# Patient Record
Sex: Male | Born: 1940 | Race: White | Hispanic: No | Smoking: Never smoker
Health system: Southern US, Community
[De-identification: ages and names within clinical notes are randomized; demographics above are authoritative.]

## PROBLEM LIST (undated history)

## (undated) DIAGNOSIS — IMO0002 Reserved for concepts with insufficient information to code with codable children: Secondary | ICD-10-CM

## (undated) DIAGNOSIS — Q211 Atrial septal defect: Secondary | ICD-10-CM

## (undated) DIAGNOSIS — E785 Hyperlipidemia, unspecified: Secondary | ICD-10-CM

## (undated) DIAGNOSIS — N183 Chronic kidney disease, stage 3 unspecified: Secondary | ICD-10-CM

## (undated) DIAGNOSIS — R768 Other specified abnormal immunological findings in serum: Secondary | ICD-10-CM

## (undated) DIAGNOSIS — I350 Nonrheumatic aortic (valve) stenosis: Secondary | ICD-10-CM

## (undated) DIAGNOSIS — S32409A Unspecified fracture of unspecified acetabulum, initial encounter for closed fracture: Secondary | ICD-10-CM

## (undated) DIAGNOSIS — I10 Essential (primary) hypertension: Secondary | ICD-10-CM

## (undated) DIAGNOSIS — I4891 Unspecified atrial fibrillation: Secondary | ICD-10-CM

## (undated) DIAGNOSIS — K5790 Diverticulosis of intestine, part unspecified, without perforation or abscess without bleeding: Secondary | ICD-10-CM

## (undated) DIAGNOSIS — I6340 Cerebral infarction due to embolism of unspecified cerebral artery: Principal | ICD-10-CM

## (undated) HISTORY — DX: Reserved for concepts with insufficient information to code with codable children: IMO0002

## (undated) HISTORY — DX: Diverticulosis of intestine, part unspecified, without perforation or abscess without bleeding: K57.90

## (undated) HISTORY — DX: Unspecified fracture of unspecified acetabulum, initial encounter for closed fracture: S32.409A

## (undated) HISTORY — DX: Essential (primary) hypertension: I10

## (undated) HISTORY — DX: Other specified abnormal immunological findings in serum: R76.8

## (undated) HISTORY — DX: Hyperlipidemia, unspecified: E78.5

---

## 2004-03-20 ENCOUNTER — Inpatient Hospital Stay (HOSPITAL_COMMUNITY): Admission: EM | Admit: 2004-03-20 | Discharge: 2004-03-20 | Payer: Self-pay | Admitting: Emergency Medicine

## 2004-03-20 HISTORY — PX: NEPHRECTOMY: SHX65

## 2004-03-28 ENCOUNTER — Ambulatory Visit (HOSPITAL_COMMUNITY): Admission: RE | Admit: 2004-03-28 | Discharge: 2004-03-28 | Payer: Self-pay | Admitting: Urology

## 2004-03-29 ENCOUNTER — Encounter (INDEPENDENT_AMBULATORY_CARE_PROVIDER_SITE_OTHER): Payer: Self-pay | Admitting: Specialist

## 2004-03-29 ENCOUNTER — Inpatient Hospital Stay (HOSPITAL_COMMUNITY): Admission: RE | Admit: 2004-03-29 | Discharge: 2004-04-01 | Payer: Self-pay | Admitting: Urology

## 2005-02-04 ENCOUNTER — Ambulatory Visit: Payer: Self-pay | Admitting: Internal Medicine

## 2005-03-13 ENCOUNTER — Ambulatory Visit: Payer: Self-pay | Admitting: Internal Medicine

## 2005-03-20 ENCOUNTER — Ambulatory Visit: Payer: Self-pay | Admitting: Internal Medicine

## 2005-03-22 ENCOUNTER — Ambulatory Visit: Payer: Self-pay | Admitting: Internal Medicine

## 2005-04-08 ENCOUNTER — Ambulatory Visit: Payer: Self-pay | Admitting: Internal Medicine

## 2005-04-18 ENCOUNTER — Ambulatory Visit: Payer: Self-pay | Admitting: Internal Medicine

## 2005-06-19 ENCOUNTER — Ambulatory Visit: Payer: Self-pay | Admitting: Internal Medicine

## 2005-07-30 ENCOUNTER — Ambulatory Visit (HOSPITAL_COMMUNITY): Admission: RE | Admit: 2005-07-30 | Discharge: 2005-07-30 | Payer: Self-pay | Admitting: Neurosurgery

## 2005-09-18 ENCOUNTER — Ambulatory Visit: Payer: Self-pay | Admitting: Internal Medicine

## 2006-02-05 ENCOUNTER — Ambulatory Visit: Payer: Self-pay | Admitting: Internal Medicine

## 2006-03-19 ENCOUNTER — Ambulatory Visit: Payer: Self-pay | Admitting: Internal Medicine

## 2006-06-17 ENCOUNTER — Ambulatory Visit: Payer: Self-pay | Admitting: Internal Medicine

## 2007-01-22 ENCOUNTER — Ambulatory Visit: Payer: Self-pay | Admitting: Gastroenterology

## 2007-01-29 ENCOUNTER — Ambulatory Visit: Payer: Self-pay | Admitting: Gastroenterology

## 2007-02-06 ENCOUNTER — Ambulatory Visit: Payer: Self-pay | Admitting: Pulmonary Disease

## 2007-04-27 ENCOUNTER — Telehealth (INDEPENDENT_AMBULATORY_CARE_PROVIDER_SITE_OTHER): Payer: Self-pay | Admitting: *Deleted

## 2007-05-19 DIAGNOSIS — I10 Essential (primary) hypertension: Secondary | ICD-10-CM | POA: Insufficient documentation

## 2007-05-19 DIAGNOSIS — E785 Hyperlipidemia, unspecified: Secondary | ICD-10-CM | POA: Insufficient documentation

## 2007-05-20 ENCOUNTER — Ambulatory Visit: Payer: Self-pay | Admitting: Internal Medicine

## 2007-06-04 ENCOUNTER — Encounter: Payer: Self-pay | Admitting: Pulmonary Disease

## 2007-06-04 ENCOUNTER — Ambulatory Visit (HOSPITAL_BASED_OUTPATIENT_CLINIC_OR_DEPARTMENT_OTHER): Admission: RE | Admit: 2007-06-04 | Discharge: 2007-06-04 | Payer: Self-pay | Admitting: Internal Medicine

## 2007-06-04 ENCOUNTER — Encounter: Payer: Self-pay | Admitting: Internal Medicine

## 2007-06-11 ENCOUNTER — Ambulatory Visit: Payer: Self-pay | Admitting: Pulmonary Disease

## 2007-06-25 ENCOUNTER — Ambulatory Visit: Payer: Self-pay | Admitting: Pulmonary Disease

## 2007-06-25 DIAGNOSIS — G4733 Obstructive sleep apnea (adult) (pediatric): Secondary | ICD-10-CM

## 2008-02-24 ENCOUNTER — Telehealth (INDEPENDENT_AMBULATORY_CARE_PROVIDER_SITE_OTHER): Payer: Self-pay | Admitting: *Deleted

## 2008-03-17 ENCOUNTER — Ambulatory Visit: Payer: Self-pay | Admitting: Internal Medicine

## 2008-03-29 ENCOUNTER — Telehealth (INDEPENDENT_AMBULATORY_CARE_PROVIDER_SITE_OTHER): Payer: Self-pay | Admitting: *Deleted

## 2008-06-10 ENCOUNTER — Ambulatory Visit: Payer: Self-pay | Admitting: Internal Medicine

## 2008-06-10 DIAGNOSIS — R358 Other polyuria: Secondary | ICD-10-CM

## 2008-07-19 ENCOUNTER — Ambulatory Visit: Payer: Self-pay | Admitting: Internal Medicine

## 2008-07-19 DIAGNOSIS — G114 Hereditary spastic paraplegia: Secondary | ICD-10-CM

## 2008-07-19 DIAGNOSIS — R079 Chest pain, unspecified: Secondary | ICD-10-CM | POA: Insufficient documentation

## 2008-07-19 LAB — CONVERTED CEMR LAB
ALT: 20 units/L (ref 0–53)
AST: 16 units/L (ref 0–37)
Albumin: 3.9 g/dL (ref 3.5–5.2)
Alkaline Phosphatase: 62 units/L (ref 39–117)
BUN: 18 mg/dL (ref 6–23)
Basophils Absolute: 0.1 10*3/uL (ref 0.0–0.1)
Basophils Relative: 1 % (ref 0.0–3.0)
Bilirubin Urine: NEGATIVE
Bilirubin, Direct: 0.1 mg/dL (ref 0.0–0.3)
CO2: 30 meq/L (ref 19–32)
CRP, High Sensitivity: 3 (ref 0.00–5.00)
Calcium: 9.6 mg/dL (ref 8.4–10.5)
Chloride: 105 meq/L (ref 96–112)
Cholesterol: 179 mg/dL (ref 0–200)
Creatinine, Ser: 1.3 mg/dL (ref 0.4–1.5)
Eosinophils Absolute: 0.3 10*3/uL (ref 0.0–0.7)
Eosinophils Relative: 3.2 % (ref 0.0–5.0)
GFR calc Af Amer: 71 mL/min
GFR calc non Af Amer: 58 mL/min
Glucose, Bld: 137 mg/dL — ABNORMAL HIGH (ref 70–99)
HCT: 47 % (ref 39.0–52.0)
HDL: 30.8 mg/dL — ABNORMAL LOW (ref >39.0)
Hemoglobin, Urine: NEGATIVE
Hemoglobin: 16.3 g/dL (ref 13.0–17.0)
Ketones, ur: NEGATIVE mg/dL
LDL Cholesterol: 115 mg/dL — ABNORMAL HIGH (ref 0–99)
Leukocytes, UA: NEGATIVE
Lymphocytes Relative: 40 % (ref 12.0–46.0)
MCHC: 34.6 g/dL (ref 30.0–36.0)
MCV: 86.2 fL (ref 78.0–100.0)
Monocytes Absolute: 0.6 10*3/uL (ref 0.1–1.0)
Monocytes Relative: 5.6 % (ref 3.0–12.0)
Neutro Abs: 5.1 10*3/uL (ref 1.4–7.7)
Neutrophils Relative %: 50.2 % (ref 43.0–77.0)
Nitrite: NEGATIVE
Platelets: 286 10*3/uL (ref 150–400)
Potassium: 4 meq/L (ref 3.5–5.1)
RBC: 5.45 M/uL (ref 4.22–5.81)
RDW: 12.6 % (ref 11.5–14.6)
Sodium: 142 meq/L (ref 135–145)
Specific Gravity, Urine: 1.015 (ref 1.000–1.035)
TSH: 2.96 microintl units/mL (ref 0.35–5.50)
Total Bilirubin: 0.8 mg/dL (ref 0.3–1.2)
Total CHOL/HDL Ratio: 5.8
Total Protein, Urine: NEGATIVE mg/dL
Total Protein: 6.9 g/dL (ref 6.0–8.3)
Triglycerides: 165 mg/dL — ABNORMAL HIGH (ref 0–149)
Urine Glucose: NEGATIVE mg/dL
Urobilinogen, UA: 0.2 (ref 0.0–1.0)
VLDL: 33 mg/dL (ref 0–40)
WBC: 10.2 10*3/uL (ref 4.5–10.5)
pH: 6 (ref 5.0–8.0)

## 2008-07-27 ENCOUNTER — Ambulatory Visit: Payer: Self-pay | Admitting: Internal Medicine

## 2008-07-27 DIAGNOSIS — H6120 Impacted cerumen, unspecified ear: Secondary | ICD-10-CM

## 2008-08-09 ENCOUNTER — Encounter: Payer: Self-pay | Admitting: Internal Medicine

## 2008-12-22 ENCOUNTER — Ambulatory Visit: Payer: Self-pay | Admitting: Internal Medicine

## 2008-12-23 LAB — CONVERTED CEMR LAB
ALT: 27 units/L (ref 0–53)
AST: 22 units/L (ref 0–37)
Albumin: 4.1 g/dL (ref 3.5–5.2)
Alkaline Phosphatase: 60 units/L (ref 39–117)
BUN: 16 mg/dL (ref 6–23)
Bilirubin, Direct: 0.2 mg/dL (ref 0.0–0.3)
CO2: 25 meq/L (ref 19–32)
Calcium: 10 mg/dL (ref 8.4–10.5)
Chloride: 106 meq/L (ref 96–112)
Cholesterol: 193 mg/dL (ref 0–200)
Creatinine, Ser: 1.3 mg/dL (ref 0.4–1.5)
GFR calc non Af Amer: 58.24 mL/min (ref >60)
Glucose, Bld: 139 mg/dL — ABNORMAL HIGH (ref 70–99)
HDL: 29.9 mg/dL — ABNORMAL LOW (ref >39.00)
LDL Cholesterol: 132 mg/dL — ABNORMAL HIGH (ref 0–99)
Potassium: 4 meq/L (ref 3.5–5.1)
Sodium: 146 meq/L — ABNORMAL HIGH (ref 135–145)
Total Bilirubin: 1.1 mg/dL (ref 0.3–1.2)
Total CHOL/HDL Ratio: 6
Total Protein: 7.3 g/dL (ref 6.0–8.3)
Triglycerides: 154 mg/dL — ABNORMAL HIGH (ref 0.0–149.0)
VLDL: 30.8 mg/dL (ref 0.0–40.0)

## 2009-04-04 ENCOUNTER — Telehealth (INDEPENDENT_AMBULATORY_CARE_PROVIDER_SITE_OTHER): Payer: Self-pay | Admitting: *Deleted

## 2009-09-17 HISTORY — PX: HIP SURGERY: SHX245

## 2009-10-23 ENCOUNTER — Encounter: Payer: Self-pay | Admitting: Adult Health

## 2009-11-13 ENCOUNTER — Encounter: Payer: Self-pay | Admitting: Internal Medicine

## 2009-11-13 ENCOUNTER — Telehealth (INDEPENDENT_AMBULATORY_CARE_PROVIDER_SITE_OTHER): Payer: Self-pay | Admitting: *Deleted

## 2009-11-14 ENCOUNTER — Telehealth (INDEPENDENT_AMBULATORY_CARE_PROVIDER_SITE_OTHER): Payer: Self-pay | Admitting: *Deleted

## 2009-11-14 ENCOUNTER — Ambulatory Visit: Payer: Self-pay | Admitting: Pulmonary Disease

## 2009-11-14 DIAGNOSIS — S72009A Fracture of unspecified part of neck of unspecified femur, initial encounter for closed fracture: Secondary | ICD-10-CM | POA: Insufficient documentation

## 2009-11-23 ENCOUNTER — Encounter: Payer: Self-pay | Admitting: Internal Medicine

## 2009-11-24 ENCOUNTER — Encounter: Payer: Self-pay | Admitting: Internal Medicine

## 2009-11-27 ENCOUNTER — Encounter: Payer: Self-pay | Admitting: Internal Medicine

## 2009-11-28 ENCOUNTER — Encounter: Payer: Self-pay | Admitting: Internal Medicine

## 2009-11-29 ENCOUNTER — Ambulatory Visit: Payer: Self-pay | Admitting: Internal Medicine

## 2009-12-19 ENCOUNTER — Encounter: Payer: Self-pay | Admitting: Internal Medicine

## 2010-01-04 ENCOUNTER — Encounter: Payer: Self-pay | Admitting: Internal Medicine

## 2010-03-15 ENCOUNTER — Encounter: Payer: Self-pay | Admitting: Internal Medicine

## 2010-05-08 ENCOUNTER — Ambulatory Visit: Payer: Self-pay | Admitting: Internal Medicine

## 2010-05-08 ENCOUNTER — Encounter: Payer: Self-pay | Admitting: Internal Medicine

## 2010-05-08 LAB — CONVERTED CEMR LAB
ALT: 24 units/L (ref 0–53)
AST: 23 units/L (ref 0–37)
Albumin: 3.8 g/dL (ref 3.5–5.2)
Alkaline Phosphatase: 72 units/L (ref 39–117)
BUN: 17 mg/dL (ref 6–23)
Basophils Absolute: 0 10*3/uL (ref 0.0–0.1)
Basophils Relative: 0.4 % (ref 0.0–3.0)
Bilirubin Urine: NEGATIVE
Bilirubin, Direct: 0.1 mg/dL (ref 0.0–0.3)
Blood, UA: NEGATIVE
CO2: 29 meq/L (ref 19–32)
Calcium: 9.7 mg/dL (ref 8.4–10.5)
Chloride: 104 meq/L (ref 96–112)
Cholesterol: 218 mg/dL — ABNORMAL HIGH (ref 0–200)
Creatinine, Ser: 1.1 mg/dL (ref 0.4–1.5)
Direct LDL: 132.1 mg/dL
Eosinophils Absolute: 0.4 10*3/uL (ref 0.0–0.7)
Eosinophils Relative: 3.5 % (ref 0.0–5.0)
GFR calc non Af Amer: 71.85 mL/min (ref >60.00)
Glucose, Bld: 132 mg/dL — ABNORMAL HIGH (ref 70–99)
HCT: 48.5 % (ref 39.0–52.0)
HDL: 37.5 mg/dL — ABNORMAL LOW (ref >39.00)
Hemoglobin: 16.5 g/dL (ref 13.0–17.0)
Ketones, ur: NEGATIVE mg/dL
Leukocytes, UA: NEGATIVE
Lymphocytes Relative: 37.8 % (ref 12.0–46.0)
Lymphs Abs: 4.4 10*3/uL — ABNORMAL HIGH (ref 0.7–4.0)
MCHC: 34 g/dL (ref 30.0–36.0)
MCV: 87.1 fL (ref 78.0–100.0)
Monocytes Absolute: 0.7 10*3/uL (ref 0.1–1.0)
Monocytes Relative: 6.1 % (ref 3.0–12.0)
Neutro Abs: 6.1 10*3/uL (ref 1.4–7.7)
Neutrophils Relative %: 52.2 % (ref 43.0–77.0)
Nitrite: NEGATIVE
Platelets: 304 10*3/uL (ref 150.0–400.0)
Potassium: 4.4 meq/L (ref 3.5–5.1)
RBC: 5.56 M/uL (ref 4.22–5.81)
RDW: 13.7 % (ref 11.5–14.6)
Sodium: 140 meq/L (ref 135–145)
Specific Gravity, Urine: 1.01 (ref 1.000–1.030)
TSH: 3.7 microintl units/mL (ref 0.35–5.50)
Total Bilirubin: 0.7 mg/dL (ref 0.3–1.2)
Total CHOL/HDL Ratio: 6
Total Protein, Urine: NEGATIVE mg/dL
Total Protein: 7 g/dL (ref 6.0–8.3)
Triglycerides: 238 mg/dL — ABNORMAL HIGH (ref 0.0–149.0)
Urine Glucose: NEGATIVE mg/dL
Urobilinogen, UA: 0.2 (ref 0.0–1.0)
VLDL: 47.6 mg/dL — ABNORMAL HIGH (ref 0.0–40.0)
WBC: 11.7 10*3/uL — ABNORMAL HIGH (ref 4.5–10.5)
pH: 6.5 (ref 5.0–8.0)

## 2010-05-09 LAB — CONVERTED CEMR LAB: Vit D, 25-Hydroxy: 37 ng/mL (ref 30–89)

## 2010-06-09 ENCOUNTER — Encounter: Payer: Self-pay | Admitting: Neurosurgery

## 2010-06-19 NOTE — Letter (Signed)
Summary: [facility]  [facility]   Imported By: Sherian Rein 11/29/2009 07:50:29  _____________________________________________________________________  External Attachment:    Type:   Image     Comment:   External Document

## 2010-06-19 NOTE — Assessment & Plan Note (Signed)
Summary: Primary svc/ f/u hbp/ ear wax   Copy to:  kaplan/Dann Galicia Primary Provider/Referring Provider:  Sherene Sires  CC:  2 wk followup.  Pt states no complaints today.Marland Kitchen  History of Present Illness: 70  yowm  never smoker with hypertension and hyperlipidemia and spastic paraparesis.   June 10, 2008 missed cpx,  c/o nocturia x 3 x years,  persistent nightly pattern, was drinking lots of sodas so wife rec he switch to tea, no better. denies dysuria or decreased fos.  resolved after d/c excess fluids  July 19, 2008 ov after fell 2 weeks prior to ov hit left chest just below lateral aspect of left breast ( 4-5 oclock to nipple) with small bruise was painful to cough and deep breath better by ov on advil, not sob, no cough.     July 27, 2008--returns for 1 week follow up and lab review. CXR last ov w/ chronic changes. Labs showed crp 3.0 , LDL at goal of 115 on lipitor 10mg . Has bilateral wax impactions, to have ear wash today.   December 22, 2008 ov to check bp , no sob tia or claudication.     November 14, 2009--Returns for follow up and med review.  Fell off trailer 5/26 w/ left  acetabular fx admitted [facility] w/ medical managment, discharged to rehab for 3 weeks. Discharged 2 days ago (6/26). Also right lower leg injury w/ old poor healed fx that required cast for support.  Discharged on  lovenox 30mg  two times a day to until 11/24/09.Marland Kitchen Has follow up with ortho 12/25/09. We have no discharge summary or hospital/rehab notes. I reveiewed the ER note day of admit. We are trying to get records. His HTN meds are unchanged. He stopped Lipitor >9 months ago.  Home health w/ labs done yesterday -Intermim per pt. . PT in home is suppose to begin soon. He is on no weight bearing for left leg. Wife and family are with him today. November 29, 2009 2 wk followup.  Pt states no complaints today. in Wheelchair with questions re rehab issues.  Pt denies any significant sore throat, dysphagia, itching, sneezing,  nasal congestion or  excess secretions,  fever, chills, sweats, unintended wt loss, pleuritic or exertional cp, hempoptysis,   orthopnea pnd or leg swelling    Current Medications (verified): 1)  Furosemide 20 Mg  Tabs (Furosemide) .... Every Morning 2)  Adult Aspirin Low Strength 81 Mg  Tbdp (Aspirin) .Marland Kitchen.. 1 By Mouth Daily 3)  Lotrel 10-20 Mg  Caps (Amlodipine Besy-Benazepril Hcl) .Marland Kitchen.. 1 By Mouth Daily 4)  Multivitamins  Tabs (Multiple Vitamin) .Marland Kitchen.. 1 By Mouth Once Daily 5)  Senokot 8.6 Mg Tabs (Sennosides) .... As Directed As Needed  Allergies (verified): No Known Drug Allergies  Past History:  Past Medical History: Hyperlipidemia     - Target LDL < 130 (hbp, male gender) Hypertension Spastic Paraparesis.............................................................................Mar[facility] Health Maintenance..............................................................................Marland KitchenWert      - DT  1/07      - Pneumovax 1/07      - CPX July 19, 2008  Hep C antibody positive Diverticulosis     - Colonoscopy 01/29/2007 Left acetabular fx 10/12/09 w/ hospitalization/rehab  x4 weeks at [facility].............Marland KitchenNoralyn Pick      - no surgery required  Vital Signs:  Patient profile:   70 year old male O2 Sat:      96 % on Room air Temp:     98.2 degrees F oral Pulse rate:   72 / minute  BP sitting:   122 / 76  (left arm)  Vitals Entered By: Vernie Murders (November 29, 2009 11:12 AM)  O2 Flow:  Room air  Physical Exam  Additional Exam:  elderly wm  healthy appearing in no acute distress- in wheelchair  Afeb with normal vital signs wt  206 March 17, 2008  > 209 June 10, 2008 > 207 July 19, 2008 >>212 July 27, 2008  HEENT: nl dentition, turbinates, and orophanx. partial wax in both ears with cough reflex noted Neck without JVD/Nodes/TM/ nl carotids, no bruits. Lungs clear to A and P bilaterally without cough on insp or exp maneuvers RRR no s3 or murmur or increase in P2. No edema.    Abd soft and benign with nl excursion in the supine position. Ext warm without calf tenderness, cyanosis clubbing or edema, cast on right lower leg.     Impression & Recommendations:  Problem # 1:  HYPERTENSION (ICD-401.9)  His updated medication list for this problem includes:    Furosemide 20 Mg Tabs (Furosemide) ..... Every morning    Lotrel 10-20 Mg Caps (Amlodipine besy-benazepril hcl) .Marland Kitchen... 1 by mouth daily  Orders:  Problem # 2:  HYPERLIPIDEMIA (ICD-272.4)  Labs Reviewed: SGOT: 22 (12/22/2008)   SGPT: 27 (12/22/2008)   HDL:29.90 (12/22/2008), 30.8 (07/19/2008)  LDL:132 (12/22/2008), 115 (07/19/2008)  Chol:193 (12/22/2008), 179 (07/19/2008)  Trig:154.0 (12/22/2008), 165 (07/19/2008)  f/u cpx due  Orders: Est. 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 As directed as needed  Patient Instructions: 1)  All questions re rehab / wt bearing per Dr [name]. 2)  Return to office in 3 months, sooner if needed for CPX

## 2010-06-19 NOTE — Miscellaneous (Signed)
Summary: Plan of Care / [facility]  Plan of Care / [facility]   Imported By: Lennie Odor 12/22/2009 14:03:13  _____________________________________________________________________  External Attachment:    Type:   Image     Comment:   External Document

## 2010-06-19 NOTE — Assessment & Plan Note (Signed)
Summary: followup//lmr   Copy to:  kaplan/wert Primary Provider/Referring Provider:  Sherene Sires   History of Present Illness: 70  yowm  never smoker with hypertension and hyperlipidemia and spastic paraparesis.   June 10, 2008 missed cpx,  c/o nocturia x 3 x years,  persistent nightly pattern, was drinking lots of sodas so wife rec he switch to tea, no better. denies dysuria or decreased fos.  resolved after d/c excess fluids  July 19, 2008 ov after fell 2 weeks prior to ov hit left chest just below lateral aspect of left breast ( 4-5 oclock to nipple) with small bruise was painful to cough and deep breath better by ov on advil, not sob, no cough.     July 27, 2008--returns for 1 week follow up and lab review. CXR last ov w/ chronic changes. Labs showed crp 3.0 , LDL at goal of 115 on lipitor 10mg . Has bilateral wax impactions, to have ear wash today.   December 22, 2008 ov to check bp , no sob tia or claudication.     November 14, 2009--Returns for follow up and med review.  Fell off trailer 5/26 w/ left  acetabular fx admitted [facility] w/ medical managment, discharged to rehab for 3 weeks. Discharged 2 days ago (6/26). Also right lower leg injury w/ old poor healed fx that required cast for support.  Discharged on  lovenox 30mg  two times a day to until 11/24/09.Marland Kitchen Has follow up with ortho 12/25/09. We have no discharge summary or hospital/rehab notes. I reveiewed the ER note day of admit. We are trying to get records. His HTN meds are unchanged. He stopped Lipitor >9 months ago.  Home health w/ labs done yesterday -Intermim per pt. . PT in home is suppose to begin soon. He is on no weight bearing for left leg. Wife and family are with him today. Denies chest pain, dyspnea, orthopnea, hemoptysis, fever, n/v/d, edema, headache, hematuria or bloody stools.   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  His mother had hypertension and died of an aneurysm.  His father may have died of a heart attack at the age of 3.  Social History: Last updated: 05/20/2007 Patient never smoked.   Past Medical History: Hyperlipidemia     - Target LDL < 130 (hbp, male gender) Hypertension Spastic Paraparesis.............................................................................Marland Kitchen[facility] Health Maintenance..............................................................................Marland KitchenWert      - DT  1/07      - Pneumovax 1/07      - CPX July 19, 2008  Hep C antibody positive Diverticulosis     - Colonoscopy 01/29/2007 Left acetabular fx 10/12/09 w/ hospitalization/rehab  x4 weeks at [facility]   Review of Systems      See HPI  Vital Signs:  Patient profile:   70 year old male O2 Sat:      96 % on Room air Temp:     98.5 degrees F oral Pulse rate:   79 / minute BP sitting:   150 / 88  (right arm) Cuff size:   regular  Vitals Entered By: Gweneth Dimitri RN (November 14, 2009 12:12 PM)  O2 Flow:  Room air  Physical Exam  Additional Exam:  elderly wm  healthy appearing in no acute distress-  in wheelchair  Afeb with normal vital signs wt  206 March 17, 2008  > 209 June 10, 2008 > 207 July 19, 2008 >>212 July 27, 2008  HEENT: nl dentition, turbinates, and orophanx. partial wax in both ears.  Neck without JVD/Nodes/TM/ nl carotids, no bruits. Lungs clear to A and P bilaterally without cough on insp or exp maneuvers RRR no s3 or murmur or increase in P2. No edema.  Abd soft and benign with nl excursion in the supine position. Ext warm without calf tenderness, cyanosis clubbing or edema, cast on right lower leg.     Impression  & Recommendations:  Problem # 1:  HYPERTENSION (ICD-401.9) advised on low salt diet will try to obtain records.  cont on same meds.  His updated medication list for this problem includes:    Furosemide 20 Mg Tabs (Furosemide) ..... Every morning    Lotrel 10-20 Mg Caps (Amlodipine besy-benazepril hcl) .Marland Kitchen... 1 by mouth daily  Problem # 2:  HYPERLIPIDEMIA (ICD-272.4)  has stopped statin.  advised on low fat/chol diet.  hold on statin for now will work up w/ flp at later date.  The following medications were removed from the medication list:    Lipitor 10 Mg Tabs (Atorvastatin calcium) .Marland KitchenMarland KitchenMarland KitchenMarland Kitchen 3 times per wk  Labs Reviewed: SGOT: 22 (12/22/2008)   SGPT: 27 (12/22/2008)   HDL:29.90 (12/22/2008), 30.8 (07/19/2008)  LDL:132 (12/22/2008), 115 (07/19/2008)  Chol:193 (12/22/2008), 179 (07/19/2008)  Trig:154.0 (12/22/2008), 165 (07/19/2008)  Problem # 3:  HIP FRACTURE, LEFT (ICD-820.8)  s/p left acetabular fx 2/2 traumatic fall.  cont w/ ortho follow up  PT at home will cont on lovenox per recs.  request [facility] records [facility].   Orders: Est. Patient Level IV (19147)  Medications Added to Medication List This Visit: 1)  Multivitamins Tabs (Multiple vitamin) .Marland Kitchen.. 1 by mouth once daily 2)  Colace 100 Mg Caps (Docusate sodium) .Marland Kitchen.. 1 by mouth once daily 3)  Lovenox 30 Mg/0.34ml Soln (Enoxaparin sodium) .Marland Kitchen.. 1 subcutaneously two times a day until july 8th  Complete Medication List: 1)  Furosemide 20 Mg Tabs (Furosemide) .... Every morning 2)  Adult Aspirin Low Strength 81 Mg Tbdp (Aspirin) .Marland Kitchen.. 1 by mouth daily 3)  Lotrel 10-20 Mg Caps (Amlodipine besy-benazepril hcl) .Marland Kitchen.. 1 by mouth daily 4)  Multivitamins Tabs (Multiple vitamin) .Marland Kitchen.. 1 by mouth once daily 5)  Colace 100 Mg Caps (Docusate sodium) .Marland Kitchen.. 1 by mouth once daily 6)  Lovenox 30 Mg/0.2ml Soln (Enoxaparin sodium) .Marland Kitchen.. 1 subcutaneously two times a day until july 8th  Patient Instructions: 1)  Restart Lovenox 30mg  two  times a day as recommended until 11/24/09 2)  follow up ortho as directed and continue with Physical therapy.  3)  follow up Dr. Sherene Sires in 2 weeks and as needed  4)  May use debrox in ears if not improved I can wash them out on return.  Prescriptions: LOVENOX 30 MG/0.3ML SOLN (ENOXAPARIN SODIUM) 1 subcutaneously two times a day until july 8th  #22 x 0   Entered and Authorized by:   Rubye Oaks NP   Signed by:   Nazarene Bunning NP on 11/14/2009   Method used:   Electronically to        [facility] [address] [phone]* (retail)       [address]       [place], [place]  [address]       Ph: [phone]  or [phone]       Fax: [phone]   RxID:   [id number]

## 2010-06-19 NOTE — Progress Notes (Signed)
  Phone Note Other Incoming   Request: Send information Summary of Call: Records received from [facility]. 40 pages forwarded to [facility] for Dr. Sherene Sires to review.

## 2010-06-19 NOTE — Miscellaneous (Signed)
Summary: Plan of Care/[facility]  [facility]   Imported By: Sherian Rein 11/30/2009 09:55:16  _____________________________________________________________________  External Attachment:    Type:   Image     Comment:   External Document

## 2010-06-19 NOTE — Letter (Signed)
Summary: [facility]  [facility]   Imported By: Lester Winchester 12/14/2009 08:40:05  _____________________________________________________________________  External Attachment:    Type:   Image     Comment:   External Document

## 2010-06-19 NOTE — Progress Notes (Signed)
Summary: medication  Phone Note Call from Patient Call back at [phone]   [facility]er: [facility]  sharon Call For: wert Summary of Call: need lovenox 30mg  every 12 hours thru july 6 [facility]  Initial call taken by: Rickard Patience,  November 13, 2009 2:20 PM  Follow-up for Phone Call        lmom for [facility] TCB Vernie Murders  November 13, 2009 2:58 PM  Spoke with Jasmine December and advised that pt noshowed for both of his appts today and needs appt for medications.  He will be running out of lovenox soon per nurse so sched him to see TP tommorrow at 11:45. Follow-up by: Vernie Murders,  November 13, 2009 3:08 PM

## 2010-06-19 NOTE — Letter (Signed)
Summary: [facility]  [facility]   Imported By: Lester Edinburg 03/20/2010 07:52:10  _____________________________________________________________________  External Attachment:    Type:   Image     Comment:   External Document

## 2010-06-19 NOTE — Progress Notes (Signed)
Summary: NEED LABS  Phone Note Call from Patient Call back at [phone]   Caller: Spouse Call For: [name] Summary of Call: CALLING ABOUT PT COMING IN  FOR BLOOD WORK BECAUSE HE HAS BEEN TAKING BLOOD THINNER. Initial call taken by: Rickard Patience,  November 13, 2009 9:43 AM  Follow-up for Phone Call        [facility] Vernie Murders  November 13, 2009 9:48 AM Spoke with pt's spouse.  She had questions about checking labs on this pt.  Pt has appt today and so I advised that this will be addressed at that time. Follow-up by: Vernie Murders,  November 13, 2009 10:02 AM

## 2010-06-21 NOTE — Assessment & Plan Note (Signed)
Summary: Primary svc /cpx  ear wax bilater trace edema   Copy to:  kaplan/wert Primary Provider/Referring Provider:  Sherene Sires   History of Present Illness: 57  yowm  never smoker with hypertension and hyperlipidemia and spastic paraparesis.      July 27, 2008--returns for 1 week follow up and lab review. CXR last ov w/ chronic changes. Labs showed crp 3.0 , LDL at goal of 115 on lipitor 10mg . Has bilateral wax impactions, to have ear wash today.   December 22, 2008 ov to check bp , no sob tia or claudication.     November 14, 2009--Returns for follow up and med review.  Fell off trailer 5/26 w/ left  acetabular fx admitted [facility] w/ medical managment, discharged to rehab for 3 weeks. Discharged 2 days ago (6/26). Also right lower leg injury w/ old poor healed fx that required cast for support.  Discharged on  lovenox 30mg  two times a day to until 11/24/09.Marland Kitchen Has follow up with ortho 12/25/09. We have no discharge summary or hospital/rehab notes. I reveiewed the ER note day of admit. We are trying to get records. His HTN meds are unchanged. He stopped Lipitor >9 months ago.  Home health w/ labs done yesterday -Intermim per pt. . PT in home is suppose to begin soon. He is on no weight bearing for left leg. Wife and family are with him today.    May 08, 2010 cpx no new co/s. Pt denies any significant sore throat, dysphagia, itching, sneezing,  nasal congestion or excess secretions,  fever, chills, sweats, unintended wt loss, pleuritic or exertional cp, hempoptysis, change in activity tolerance  orthopnea pnd or leg swelling    Current Medications (verified): 1)  Furosemide 20 Mg  Tabs (Furosemide) .... Every Morning 2)  Adult Aspirin Low Strength 81 Mg  Tbdp (Aspirin) .Marland Kitchen.. 1 By Mouth Daily 3)  Lotrel 10-20 Mg  Caps (Amlodipine Besy-Benazepril Hcl) .Marland Kitchen.. 1 By Mouth Daily 4)  Multivitamins  Tabs (Multiple Vitamin) .Marland Kitchen.. 1 By Mouth Once Daily 5)  Senokot 8.6 Mg Tabs (Sennosides) .... As Directed As  Needed  Allergies (verified): No Known Drug Allergies  Past History:  Past Medical History: Hyperlipidemia     - Target LDL < 130 (hbp, male gender) Hypertension Spastic Paraparesis.............................................................................[facility]..............................................................................Marland KitchenWert      - DT  1/07      - Pneumovax 1/07 (age 41)       - CPX May 08, 2010   Hep C antibody positive Diverticulosis     - Colonoscopy 01/29/2007 Left acetabular fx 10/12/09 w/ hospitalization/rehab  x4 weeks at [facility].............Marland KitchenNoralyn Pick    Past Surgical History: Right nephrectomy 11/05.....................................................Marland KitchenTannenbaum    - 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  His mother had hypertension and died of an aneurysm.  His father may have died of a heart attack at the age of 88. spastic paraparesis both sisters  Social History: Patient never smoked.  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 partial wax in both ears with cough reflex noted Neck without JVD/Nodes/TM/ nl carotids, no bruits. Lungs clear to A and P bilaterally without cough on insp or exp  maneuvers RRR no s3 or murmur or increase in P2.  trace edema.  Abd soft and benign with nl excursion in the supine position. Ext warm without calf tenderness, cyanosis clubbing  Neuro able walk only with rolling walker  MS no joint deformity Skin no lesions  Vitamin D (25-Hydroxy)                             37 ng/mL                    30-89  Cholesterol          [H]  218 mg/dL                   4-034     ATP III Classification            Desirable:  < 200 mg/dL                    Borderline High:  200 - 239 mg/dL               High:  > = 240 mg/dL   Triglycerides        [H]  238.0 mg/dL                 7.4-259.5     Normal:  <150 mg/dL     Borderline High:  638 - 199 mg/dL   HDL                  [L]  75.64 mg/dL                 >33.29   VLDL Cholesterol     [H]  47.6 mg/dL                  5.1-88.4  CHO/HDL Ratio:  CHD Risk                             6                    Men          Women     1/2 Average Risk     3.4          3.3     Average Risk          5.0          4.4     2X Average Risk          9.6          7.1     3X Average Risk          15.0          11.0  Tests: (2) BMP (METABOL)   Sodium                    140 mEq/L                   135-145   Potassium                 4.4 mEq/L                   3.5-5.1   Chloride                  104 mEq/L                   96-112   Carbon Dioxide            29 mEq/L                    19-32   Glucose              [H]  132 mg/dL                   14-78   BUN                       17 mg/dL                    2-95   Creatinine                1.1 mg/dL                   6.2-1.3   Calcium                   9.7 mg/dL                   0.8-65.7   GFR                       71.85 mL/min                >60.00  Tests: (3) CBC Platelet w/Diff (CBCD)   White Cell Count     [H]  11.7 K/uL                   4.5-10.5   Red Cell Count            5.56 Mil/uL                 4.22-5.81   Hemoglobin                16.5  g/dL                   84.6-96.2   Hematocrit                48.5 %                      39.0-52.0   MCV                       87.1 fl                     78.0-100.0   MCHC  34.0 g/dL                   16.1-09.6   RDW                       13.7 %                      11.5-14.6   Platelet Count            304.0 K/uL                  150.0-400.0   Neutrophil %              52.2 %                      43.0-77.0   Lymphocyte %              37.8 %                      12.0-46.0   Monocyte %                6.1 %                       3.0-12.0   Eosinophils%              3.5 %                       0.0-5.0   Basophils %               0.4 %                       0.0-3.0   Neutrophill Absolute      6.1 K/uL                    1.4-7.7   Lymphocyte Absolute  [H]  4.4 K/uL                    0.7-4.0   Monocyte Absolute         0.7 K/uL                    0.1-1.0  Eosinophils, Absolute                             0.4 K/uL                    0.0-0.7   Basophils Absolute        0.0 K/uL                    0.0-0.1  Tests: (4) Hepatic/Liver Function Panel (HEPATIC)   Total Bilirubin           0.7 mg/dL                   0.4-5.4   Direct Bilirubin          0.1 mg/dL                   0.9-8.1   Alkaline Phosphatase      72 U/L  39-117   AST                       23 U/L                      0-37   ALT                       24 U/L                      0-53   Total Protein             7.0 g/dL                    5.6-2.1   Albumin                   3.8 g/dL                    3.0-8.6  Tests: (5) TSH (TSH)   FastTSH                   3.70 uIU/mL                 0.35-5.50  Tests: (6) UDip Only (UDIP)   Color                     LT. YELLOW       RANGE:  Yellow;Lt. Yellow   Clarity                   CLEAR                       Clear   Specific Gravity          1.010                       1.000 - 1.030   Urine Ph                  6.5                         5.0-8.0    Protein                   NEGATIVE                    Negative   Urine Glucose             NEGATIVE                    Negative   Ketones                   NEGATIVE                    Negative   Urine Bilirubin           NEGATIVE                    Negative   Blood                     NEGATIVE                    Negative  Urobilinogen              0.2                         0.0 - 1.0   Leukocyte Esterace        NEGATIVE                    Negative   Nitrite                   NEGATIVE                    Negative  Tests: (7) Cholesterol LDL - Direct (DIRLDL)  Cholesterol LDL - Direct                             132.1 mg/dL  CXR  Procedure date:  05/08/2010  Findings:      Comparison: Chest x-ray of 07/19/2009   Findings: The lungs remain clear and slightly hyperaerated. Mediastinal contours are stable.  The heart is within normal limits in size.  There are degenerative changes throughout the thoracic spine.   IMPRESSION: Stable chest x-ray.  No active lung disease  Impression & Recommendations:  Problem # 1:  HYPERLIPIDEMIA (ICD-272.4)      - Target LDL < 130 (hbp, male gender)  Labs Reviewed: SGOT: 22 (12/22/2008)   SGPT: 27 (12/22/2008)   HDL:29.90 (12/22/2008), 30.8 (07/19/2008)  LDL:132 (12/22/2008), 115 (07/19/2008)  > 132 May 08 2010 off lipitor now   Chol:193 (12/22/2008), 179 (07/19/2008)  Trig:154.0 (12/22/2008), 165 (07/19/2008)  Problem # 2:  HYPERTENSION (ICD-401.9)  His updated medication list for this problem includes:    Furosemide 20 Mg Tabs (Furosemide) ..... Every morning    Lotrel 10-20 Mg Caps (Amlodipine besy-benazepril hcl) .Marland Kitchen... 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 Patient 65& > (14782)  Patient Instructions: 1)  Return to office in 3 months, sooner if needed

## 2010-06-22 NOTE — Miscellaneous (Signed)
Summary: Plan/[facility]  Plan/[facility]   Imported By: Lester Vega 12/08/2009 10:55:01  _____________________________________________________________________  External Attachment:    Type:   Image     Comment:   External Document

## 2010-06-22 NOTE — Letter (Signed)
Summary: CMN for [facility] bed/[facility]  CMN for [facility] bed/[facility]   Imported By: Sherian Rein 12/04/2009 07:27:56  _____________________________________________________________________  External Attachment:    Type:   Image     Comment:   External Document

## 2010-07-17 ENCOUNTER — Telehealth: Payer: Self-pay | Admitting: Internal Medicine

## 2010-07-26 NOTE — Progress Notes (Signed)
Summary: Ok to do generic substitute for lotrel  Phone Note Call from Patient Call back at [phone]   Caller: Spouse//cynthia Call For: Suly Vukelich Summary of Call: Wants to speak to nurse in ref to lotrel 10-20mg  has issues with medicare paying for drug. [facility] Initial call taken by: Darletta Moll,  July 17, 2010 12:42 PM  Follow-up for Phone Call        [facility] Vernie Murders  July 17, 2010 3:01 PM  Spoke with pt's spouse.  She states that copay for lotrel is up to 60 $ for 1 month supply.  She states that if he takes amlodipine and benzapril separate it would be cheaper.  Pls advise thanks  Follow-up by: Vernie Murders,  July 17, 2010 4:37 PM  Additional Follow-up for Phone Call Additional follow up Details #1::        fine with me to use the same amts in separate pills but then there will be two deductibles so they will have to do the math to be sure it's a savings Additional Follow-up by: Nyoka Cowden MD,  July 17, 2010 8:49 PM    Additional Follow-up for Phone Call Additional follow up Details #2::    Spoke with Aram Beecham and advised her of MW recs, she advised the 2 medications divided will only be $3 each. Rx sent to pharmacy. Zackery Barefoot CMA  July 18, 2010 10:07 AM   New/Updated Medications: AMLODIPINE BESYLATE 10 MG TABS (AMLODIPINE BESYLATE) Take 1 tablet by mouth once a day BENAZEPRIL HCL 20 MG TABS (BENAZEPRIL HCL) Take 1 tablet by mouth once a day Prescriptions: BENAZEPRIL HCL 20 MG TABS (BENAZEPRIL HCL) Take 1 tablet by mouth once a day  #30 x 1   Entered by:   Zackery Barefoot CMA   Authorized by:   Nyoka Cowden MD   Signed by:   Zackery Barefoot CMA on 07/18/2010   Method used:   [facility]  [address] [phone]* (retail)       [address]       [place], [place]  [address]       Ph: [phone] or [phone]       Fax: [phone]   RxID:   [id number] AMLODIPINE BESYLATE 10 MG  TABS (AMLODIPINE BESYLATE) Take 1 tablet by mouth once a day  #30 x 1   Entered by:   Zackery Barefoot CMA   Authorized by:   Nyoka Cowden MD   Signed by:   Zackery Barefoot CMA on 07/18/2010   Method used:   [facility]        [facility]  [address] [phone]* (retail)       [address]       [place], [place]  [address]       Ph: [phone] or [phone]       Fax: [phone]   RxID:   [id number]

## 2010-08-13 ENCOUNTER — Other Ambulatory Visit: Payer: Self-pay | Admitting: Internal Medicine

## 2010-09-21 ENCOUNTER — Other Ambulatory Visit: Payer: Self-pay | Admitting: Internal Medicine

## 2010-10-02 NOTE — Procedures (Signed)
NAME:  TRIPP, GOINS NO.:  [id number]   MEDICAL RECORD NO.:  [id number]          PATIENT TYPE:  OUT   LOCATION:  SLEEP CENTER                 FACILITY:  [facility]   PHYSICIAN:  Barbaraann Share, MD,FCCPDATE OF BIRTH:  May 17[birth year] 1940   DATE OF STUDY:  06/04/2007                            NOCTURNAL POLYSOMNOGRAM   REFERRING PHYSICIAN:  Charlaine Dalton. [name], MD, FCCP   INDICATION FOR STUDY:  Hypersomnia with sleep apnea.   EPWORTH SLEEPINESS SCORE:  3   MEDICATIONS:   SLEEP ARCHITECTURE:  The patient had a total sleep time of 98 minutes  with no slow wave sleep or REM.  Sleep onset latency was prolonged at 44  minutes and sleep efficiency was very poor at 26%.   RESPIRATORY DATA:  The patient was found to have 20 obstructive  hypopneas and no apneas for an apnea/hypopnea index of 12 events per  hour.  The events were not positional but there was loud snoring noted  throughout.   OXYGEN DATA:  There was transient O2 desaturation as low as 88% with the  patient's obstructive events.   CARDIAC DATA:  No clinically significant arrhythmias were noted.   MOVEMENT-PARASOMNIA:  The patient was found to have a 143 leg jerks for  an index of 88 events per hour.  However, only 17 of these resulted in  arousal or awakening for an index of 11 per hour.   IMPRESSIONS-RECOMMENDATIONS:  1. Mild obstructive sleep apnea/hypopnea syndrome with an      apnea/hypopnea index of 12 events per hour and oxygen desaturation      as low as 88%.  It should be noted the patient had no slow wave      sleep or REM and therefore his degree of sleep apnea may be      significantly underestimated.  Treatment for this degree of sleep      apnea can include weight loss alone if applicable, upper airway      surgery, oral appliance, and also CPAP.  Clinical correlation is      suggested.  2. Very large numbers of leg jerks with what appears to be significant      sleep disruption.  Clinical correlation  is      suggested to see if the patient may have a primary movement      disorder of sleep in addition to his obstructive sleep apnea.      Barbaraann Share, MD,FCCP  Diplomate, American Board of Sleep  Medicine  Electronically Signed     K[facility]/MEDQ  D:  06/11/2007 16:34:25  T:  06/11/2007 22:02:51  Job:  [id number]

## 2010-10-02 NOTE — Assessment & Plan Note (Signed)
[facility]                             PULMONARY OFFICE NOTE   NAME:Calvin Merritt, Calvin Merritt                        MRN:          [id number]  DATE:02/06/2007                            DOB:          24-Mar-1941    CONSULTATION.   HISTORY OF PRESENT ILLNESS:  The patient is a 70 year old gentleman who  I have been asked to see for possible obstructive sleep apnea.  Patient  recently had a colonoscopy with conscious sedation and was noted to have  significant decrease in O2 saturations and also a question of apnea.  He  is being referred for the possibility of sleep apnea.  The patient  states he does have loud snoring and his family has noted occasional  pauses in his breathing during sleep.  He typically goes to bed about 11  p.m. and gets up between 7:30 to 8 a.m. to start his day.  He feels  rested each morning.  Patient manages a cemetery and denies any  difficulties with inappropriate daytime sleepiness.  The wife feels he  is not overly sleepy also.  The patient denies any difficulties with  watching movies or t.v. in terms of sleepiness and has no sleepiness  with driving.  Overall, his weight has been stable over the last 2  years.   PAST MEDICAL HISTORY:  Is significant for:  1. Hypertension which has been well controlled.  2. Dyslipidemia.  3. History of right nephrectomy for benign lesions.   CURRENT MEDICATIONS:  1. Furosemide 20 mg q.a.m.  2. Aspirin 81 mg q.a.m.  3. Lipitor 10 mg q.h.s.  4. Lotrel 10/20 daily.   Patient has no known drug allergies.   SOCIAL HISTORY:  He has never smoked.  He is married and has children.   FAMILY HISTORY:  Remarkable only for sister dying from cancer.   REVIEW OF SYSTEMS:  As per History of Present Illness.  Also, see  Patient Intake Form documented in the chart.   PHYSICAL EXAM:  GENERAL:  He is a well-developed male in no acute  distress.  Blood pressure is 132/80, pulse 61, temperature is 98,  weight  is 208 pounds.  He is 6 feet 1 inch tall, O2 saturation on room air is  94%.  HEENT:  Pupils equal, round, reactive to light and accommodation.  Extraocular muscles are intact.  NARES:  Deviated septum to the left with significant obstruction.  OROPHARYNX:  Does show mild elongation of the soft palate and uvula.  NECK:  Supple without JVD or lymphadenopathy.  There is no palpable  thyromegaly.  CHEST:  Totally clear.  CARDIAC EXAM:  Regular rate and rhythm.  No murmurs, rubs or gallops.  ABDOMEN:  Soft, nontender with good bowel sounds.  GENITAL EXAM, RECTAL EXAM, BREAST EXAM:  Not done and not indicated.  LOWER EXTREMITIES:  Shows trace edema, pulses are intact distally.  NEUROLOGICALLY:  He is alert and oriented with no obvious motor  deficits.   IMPRESSION:  Oxygen desaturation during conscious sedation.  By history,  my suspicion for clinically significant sleep apnea is  fairly low, but I  do think that we should check overnight oximetry for completeness.  I  cannot rule out the possibility of the upper airway resistant syndrome  or very mild sleep apnea that I suspect is having very little impact on  his quality of life or cardiovascular risk.   PLAN:  1. Will check overnight oximetry.  2. If the patient has significant O2 desaturation that is unexplained      then will proceed with nocturnal polysomnogram.  The patient is      agreeable with this approach.     Barbaraann Share, MD,FCCP  Electronically Signed    [facility]/MedQ  DD: 02/10/2007  DT: 02/10/2007  Job #: [id number]   cc:   Barbette Hair. Arlyce Dice, MD,FACG  Charlaine Dalton Sherene Sires, MD, FCCP

## 2010-10-05 NOTE — Assessment & Plan Note (Signed)
[facility]                               PULMONARY OFFICE NOTE   NAME:Calvin Merritt, Calvin Merritt                        MRN:          [id number]  DATE:02/05/2006                            DOB:          February 09, 1941    HISTORY:  This is a 70 year old white male with difficult-to-control  hypertension and also a tendency to confusion with medications.  He returns  today for followup, again confused with his medications, cannot name any of  them by name.  Tends to identify one as a blood pressure pill, one as a  fluid pill.   He denies any exertional chest pain, orthopnea, PND, or leg swelling.   PHYSICAL EXAMINATION:  VITAL SIGNS:  Blood pressure 150/90 in the left arm,  140/82 in the right.  GENERAL:  This is a pleasant, ambulatory, moderately obese white male in no  acute distress.  HEENT:  Unremarkable.  Oropharynx is clear.  LUNGS:  Lung fields are clear to auscultation and percussion bilaterally.  HEART:  Regular rhythm without murmur, rub or gallop.  ABDOMEN:  Soft, benign.  EXTREMITIES:  Warm without calf tenderness, clubbing, cyanosis or edema.   IMPRESSION:  Marginal control of blood pressure in his present medicines.  I  am concerned about making things anymore complicated than they already are  because he is having trouble keeping up with the medications; however, I  went line by line over the medication calendar that he carries in his  wallet, emphasizing there should be 100% correlation between what is written  down and the actual medicines he takes.  I emphasized that the aspirin,  furosemide, and Lotrel are all taken first thing in the morning, the Lipitor  anytime in the evening, trying to help him group his medicines together  for more user-friendly administration.   A followup is due in the form of a comprehensive health care evaluation in  six weeks.  We will see him sooner if needed.  He also requested a  prescription of Viagra for  longstanding erectile dysfunction and was given  100 mg tablets 1/2 daily p.r.n.  Was cautioned about the issue of taking  nitrates, which he presently is not taking.                                   Charlaine Dalton. Sherene Sires, MD, [facility]   MBW/MedQ  DD:  02/05/2006  DT:  02/06/2006  Job #:  [id number]

## 2010-10-05 NOTE — Discharge Summary (Signed)
NAME:  Calvin Merritt, Calvin Merritt                 ACCOUNT NO.:  [id number]   MEDICAL RECORD NO.:  [id number]          PATIENT TYPE:  INP   LOCATION:  0344                         FACILITY:  [facility]   PHYSICIAN:  Sigmund I. Patsi Sears, M.D.DATE OF BIRTH:  07-Dec-1940   DATE OF ADMISSION:  03/29/2004  DATE OF DISCHARGE:  04/01/2004                                 DISCHARGE SUMMARY   DISCHARGE DIAGNOSES:  1.  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  2.  Left flank pain of unknown etiology.   HISTORY OF PRESENT ILLNESS:  Mr. Laski is a patient who was picked up by Dr.  Patsi Sears in the emergency room for left flank pain.  On CT scan, he was  noted to have two solid masses in his right kidney.  Left flank pain has  since resolved, and there is an unknown etiology for this problem.  The two  right renal masses were worrisome for renal cell carcinoma, and Dr.  Patsi Sears talked to the patient and family about radical retropubic  nephrectomy for this problem.  The patient had procedure of risks and  complications explained to him, and he decided to proceed with the surgery.   PAST MEDICAL HISTORY:  1.  Hypertension.  2.  Left flank pain.  3.  Recent discovery of two right renal masses.   HOSPITAL COURSE:  Patient was taken to the operating room, where right  radical nephrectomy was performed without complications (see operative  note).  Patient had a relatively normal postoperative course and was able to  urinate well when his Foley catheter was removed.  On postoperative day #3,  the patient was discharged to home and was discharged stable.   DISCHARGE MEDICATIONS:  1.  Percocet.  2.  Cipro.   CONDITION ON DISCHARGE:  Stable.   PLAN:  Patient will return for followup in the office with Terri Piedra,  nurse practitioner, to remove his staples.  He will return again in two more  weeks to see Dr. Patsi Sears for followup.     Holl   HB/MEDQ  D:   04/26/2004  T:  04/26/2004  Job:  [id number]

## 2010-10-05 NOTE — H&P (Signed)
NAME:  Calvin Merritt, Calvin Merritt                 ACCOUNT NO.:  [id number]   MEDICAL RECORD NO.:  [id number]          PATIENT TYPE:  INP   LOCATION:  3021                         FACILITY:  [facility]   PHYSICIAN:  Sigmund I. Patsi Sears, M.D.DATE OF BIRTH:  [birth year]   DATE OF ADMISSION:  03/19/2004  DATE OF DISCHARGE:                                HISTORY & PHYSICAL   HISTORY OF PRESENT ILLNESS:  Mr. Dirico is a 70 year old, white, married  male, primary care patient of Dr. 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  The patient developed left flank pain 3 days ago which  has gradually become worse.  The pain has moved into his left groin and  occurs only when he stands up and puts pressure on his left leg.  Today, the  patient had an appointment to see Dr. Sherene Sires at 3 p.m., but his pain became so  severe that the patient was unable to get up from a sitting position on the  toilet.  He had episode of total urinary incontinence.  No nausea, vomiting,  fevers, chills or gross hematuria noted.  He has felt somewhat agitated and  because of severe inguinal pain, inability to get up from a sitting position  and episode of urinary incontinence, he was brought by ambulance to [facility] Emergency Room at 2 p.m. on October 31.  The patient was evaluated in  the emergency room with CT scan which showed normal left renal unit.  The  patient has diverticulosis, but no acute pathology on the left side.  Two  right solid renal masses were identified.  Urology was called to see the  patient for admission because of severe left flank pain because medical  coverage for Dr. Sherene Sires was unavailable for admission.   PAST MEDICAL HISTORY:  1.  Cancerous polyp removed by Dr. Melvia Heaps in the past.  The patient      has recently had evaluation by Dr. Arlyce Dice in his office (flexible      sigmoidoscopy) which was said to be negative.  The patient has had no      blood since in his bowel movements.  2.   Hypertension treated with lisinopril 20/25 one per day, Norvasc 10 mg      1/2 tablet per day.  3.  Left ankle trauma without definite fracture several years ago.   ALLERGIES:  No known drug allergies.   MEDICATIONS:  As above.   SOCIAL HISTORY:  No tobacco or alcohol.   FAMILY HISTORY:  The patient's father died at age 77 secondary to MI.  Mother died at age 69 secondary to ruptured abdominal aortic aneurysm.  One  sister died secondary to stomach cancer.  Two sisters alive and well with  breast cancer.   PHYSICAL EXAMINATION:  GENERAL:  Well-developed, white male in no acute  distress.  VITAL SIGNS:  Pending.  SKIN:  Normal.  The patient has normal tone with normal skin color.  There  is no rash.  HEENT:  PERRL.  NECK:  Supple, nontender, no nodes.  CHEST:  Clear to P&A.  CARDIAC:  S1, S2 normal without heaves, thrills or murmurs.  ABDOMEN:  Soft, positive bowel sounds without organomegaly or masses.  There  is no CVA pain.  The groin is negative bilaterally.  There are no palpable  lymph nodes.  There is no hernia.  GENITALIA:  Testicles are descended bilaterally and measure 3 x 4 cm each.  The epididymis and vas are normal and nontender.  The penis is normal and  circumcised.  The gland is normal.  The urethra is normal.  BACK:  Negative.  There is no CVA pain.  The gluteal fold is normal.  RECTAL:  Normal sphincter tone with prostate 3+, nontender.  There is no  mass and no blood.  EXTREMITIES:  No cyanosis or edema.  Pulses are full throughout.  NEUROLOGIC:  Normal orientation to time, person and place.  Normal mood and  affect.   LABORATORY DATA AND X-RAY FINDINGS:  CT scan of head with IV contrast shows  two enhancing lesions in the right kidney in the lower pole.  The larger  mass measures 4 .4 x 4.8 cm and it appears to encompass the entire lower  pole.  It impinges in the renal pelvis.  There is also a 2.3 x 2.6 cm,  smaller, exophytic lower pole mass which is also  solid.  Evaluation of the  lumbar spine shows L4-L5 spinal stenosis with possible impingement on the  left side of the nerves.   IMPRESSION:  1.  Acute left groin pain when pressure is put on the left foot.  This      represents possible radicular pain.  The patient does have changes of      the L4-L5 and he will have magnetic resonance imaging tomorrow.  2.  History of gastrointestinal cancer.  The patient is status post      cancerous polyp removed by Dr. Arlyce Dice, but has recently had negative      evaluation in Dr. Marzetta Board office.  3.  Two solid enhancing masses in right kidney.  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  The patient has normal IVC and normal renal veins.      Question of metastatic disease remains.   PLAN:  1.  Bone scan.  2.  We will admit the patient for pain control today.  3.  I have discussed the case with the patient, his wife and daughter.  He      will probably need right radical nephrectomy next week.       SIT/MEDQ  D:  03/20/2004  T:  03/20/2004  Job:  [id number]   cc:   Charlaine Dalton. Sherene Sires, M.D. [facility]   Molly Maduro D. Arlyce Dice, M.D. [facility]

## 2010-10-05 NOTE — Assessment & Plan Note (Signed)
[facility]                             PULMONARY OFFICE NOTE   NAME:Calvin Merritt, Calvin Merritt                        MRN:          [id number]  DATE:06/17/2006                            DOB:          1940/09/25    A 70 year old white male with hypertension, hyperlipidemia, in for  followup evaluation with no significant complaints. On his last  comprehensive care evaluation an LDL of 146 versus a target of 130 was  detected but the patient tells me that he can not remember to take his  Lipitor because he is not always home for supper.  The instructions  actually say to take the Lipitor once daily and do not specify what time  of day to take it. However, he denies any exertional chest pain,  orthopnea, PND, TIA, or claudication symptoms.   For full inventory of medications please see face sheet, dated June 17, 2006.   PHYSICAL EXAMINATION:  He is a pleasant ambulatory white man in no acute  distress, he is afebrile, stable vital signs.  Blood pressure 120/80.  HEENT: Unremarkable, pharynx clear. Severe wax right greater than left.  LUNG FIELDS: Clear bilaterally to auscultation  and percussion.  Regular rate and rhythm without murmur, gallop, or rub.  ABDOMEN: Soft benign.  EXTREMITIES: Warm without calf tenderness, cyanosis, clubbing, or edema.   IMPRESSION:  1. Hypertension, well controlled.  2. Hyperlipidemia, with non adherence to the Lipitor because of a      misunderstanding.  I recommend that he take the Lipitor at bedtime      since he always sleeps at home rather than to have him try to      remember to take it whether he is home at supper or not. And we      will follow him up with a fasting lipid profile in 3 months.  3. Retained wax. I explained how to use over-the-counter preparations      and we will check that again in 3 months, sooner if he develops any      symptoms related to the retained wax.     Charlaine Dalton. Sherene Sires, MD, [facility]  Electronically Signed    MBW/MedQ  DD: 06/17/2006  DT: 06/17/2006  Job #: [id number]

## 2010-10-05 NOTE — Op Note (Signed)
NAME:  Calvin Merritt, Calvin Merritt                 ACCOUNT NO.:  [id number]   MEDICAL RECORD NO.:  [id number]          PATIENT TYPE:  INP   LOCATION:  0347                         FACILITY:  [facility]   PHYSICIAN:  Sigmund I. Patsi Sears, M.D.DATE OF BIRTH:  1940-10-13   DATE OF PROCEDURE:  03/29/2004  DATE OF DISCHARGE:                                 OPERATIVE REPORT   PREOPERATIVE DIAGNOSIS:  Right renal masses x2, consistent with renal cell  carcinoma.   POSTOPERATIVE DIAGNOSIS:  Right renal masses x2, consistent with renal cell  carcinoma.   PROCEDURE:  Right radical nephrectomy.   ATTENDING SURGEON:  Sigmund I. Patsi Sears, M.D.   RESIDENT SURGEON:  Rhae Lerner, M.D.   ANESTHESIA:  General endotracheal anesthesia.   COMPLICATIONS:  None.   ESTIMATED BLOOD LOSS:  500 cc.   INDICATIONS FOR PROCEDURE:  Mr. Leffler is a 70 year old white male who was  recently seen in the emergency department at this institution with  complaints of left flank pain and left testicular pain.  During the  patient's workup, a CT scan of the abdomen and pelvis with and without  contrast was performed and revealed two solid enhancing masses in the right  kidney highly suspicious for renal cell carcinoma.  After discussing the  natural history of renal cell carcinoma as well as possible treatment  options, Mr. Macomber has elected to proceed with an open right radical  nephrectomy.   DESCRIPTION OF PROCEDURE:  Patient was brought to the operating room, and  following induction of anesthesia, was placed in a right flank position and  prepped and draped in the usual sterile fashion.  The operating table was  flexed, and the kidney rest elevated.  An incision was subsequently made  from a point midway on the 12th rib anteromedially to the lateral border of  the rectus muscle.  Dissection was subsequently carried down to the  subcutaneous fat and the external oblique and internal oblique muscles and  their  fascia.   Attention was subsequently focused laterally, and the dissection was carried  onto the surface of the 12th rib.  A rib stripper was subsequently used to  clean off the 12th rib anteriorly and then the rib was further dissected  away from surrounding tissues using a Doyen.  Once the 12th rib had been  fully freed from all surrounding tissues distally, a rib cutter was used to  divide the 12th rib and remove the distal portion.  The posterior periosteum  of the rib was subsequently divided and the retroperitoneal space  entered.  Using two fingers to develop a space between the underlying peritoneum and  the remaining transverse abdominus muscle and transversalis fascia, the  incision was opened along its entire length, dividing the remaining  musculature.  Once the incision had been completely opened, a careful  inspection was made to rule out any openings of the peritoneum as well as in  the pleura.  There were no signs of any injury.  The Omnitract retractor was  subsequently placed with the retractor blade set to expose the right  retroperitoneum.  A plane was first developed between the posterior leaf of  Gerota's fascia and the psoas muscles.  Once the posterior surface of the  kidney had been completely dissected away from the underlying musculature,  dissection was carried down inferiorly.  The ureter was identified inferior  to the lower pole of the kidney and doubly clipped and ligated.  Next,  attention was turned to the superior pole of the kidney.  Careful dissection  was performed to free the superior pole of the kidney from the surrounding  tissues.  Care was taken to keep Gerota's fascia intact during its  dissection.  A small amount of adherent tissue was left superiorly at this  time, and attention was moved to the medial surface of the kidney and  identification of the renal hilum.  Starting at the lower pole of the  kidney, dissection was carried medially until  the second portion of the  duodenum was identified.  The duodenum was subsequently reflected medially,  and dissected carried further until the inferior vena cava was identified.  At this point, careful inspection of the surrounding tissues revealed the  right gonadal vein, and this vein was subsequently double-clipped and  divided.  Continuing dissection superiorly along the inferior vena cava, the  small veins were identified, ligated, and divided.  Finally, the main renal  vein was identified coming from the inferior vena cava.  The vein was  located more superiorly than expected.  Dissection around the vein was  meticulously performed until a 2-0 silk could be passed around the renal  vein.  Careful dissection around the renal vein revealed numerous branches,  some of which were ligated with 3-0 silk suture and divided to aid in  dissection; however, there was no clear identification of the renal artery.  At this time, attention was shifted to the superior pole of the kidney and  their remaining attachments of the superior pole were taken down, completely  freeing the kidney except for the remaining tissue at the renal hilum.  At  this point, the remaining tissue at the hilum was slowly taken down by  isolating various portions, double-ligating these portions of tissue with 3-  0 silk suture as well as with a hemoclip for added safety and then divided.  Finally, after multiple portions of this material had been divided, the  right renal artery was identified and buried within this tissue.  The artery  was subsequently double ligated with a 2-0 silk suture and then double-  clipped with hemoclips for safety.  The artery was subsequently divided.  All that remained now of the right kidney attachments was the right renal  vein.  The vein was subsequently double-ligated with a 2-0 silk suture and  then double-clipped with hemoclips.  The vein was then divided and the right kidney delivered  out of the operating field and sent to pathology.  Careful  inspection was subsequently performed of the renal hilum.  There was no sign  of any bleeding present.  The presence of the right renal artery was again  confirmed, and the secure ligation of the arterial stump confirmed as well.  The wound subsequently irrigated, and hemostasis confirmed.  The Omnitract  retractor was subsequently removed, the flex taken out of the table, and  closure initiated.  The internal oblique and its fascia were first  reapproximated using a running #1 PDS.  The external oblique and its fascia  was then reapproximated, again, using a #1 PDS in a  running fashion.  At  this point, an automatic Marcaine pump was inserted into the wound.  The  skin was subsequently reapproximated using skin staples.  The wound was  subsequently dressed, and the case was ended.  The patient tolerated the  procedure well, and there were no complications.   Dr. Jethro Bolus was present during the entire case and participated  all aspects of the procedure.     Eric   EG/MEDQ  D:  03/29/2004  T:  03/29/2004  Job:  [id number]

## 2010-10-05 NOTE — Assessment & Plan Note (Signed)
[facility]                               PULMONARY OFFICE NOTE   NAME:Calvin Merritt, Calvin Merritt                        MRN:          [id number]  DATE:03/19/2006                            DOB:          [birth year]    COMPREHENSIVE HEALTHCARE EVALUATION   HISTORY:  A 70 year old white male with a history of hypertension, obesity,  and somewhat of a childlike affect who minimizes his complaints.  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  He has  occasional nocturia but no orthopnea, PND, fevers, chills, sweats, and  enjoys normal activities of daily living.  He denies any exertional chest  pain, TIA or claudication symptoms, orthopnea, PND, or leg swelling.   PAST MEDICAL HISTORY:  1. Hypertension.  2. Intermittent low back pain without radicular features.  3. Hyperlipidemia, target LDL less than 130 based on  positive family      history and hypertension.  4. Colon polyps status post colonoscopy 2003, in computer for recall 2008.  5. Asymptomatic hyperuricemia.  6. Positive hepatitis C antibody in 1998 with no evidence of elevated      liver function tests or cirrhosis to date.  7. 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   MEDICATIONS:  Taken in detail on the worksheet and corrected as in the  column dated March 19, 2006.   SOCIAL HISTORY:  He has never smoked.  He denies any alcohol use.  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   FAMILY HISTORY:  Positive for cancer in the stomach in one of his sisters,  cancer of the breast in two of his other sisters.  Mother had hypertension,  died of an aneurysm.  Father had some kind of a heart attack at age 74  with no other information available.  Interestingly, one of his sisters has  the same gait abnormality that he does.   REVIEW OF SYSTEMS:  Taken in detail on the  worksheet and negative except as  outlined above.   PHYSICAL EXAMINATION:  GENERAL:  This is a pleasant ambulatory white male  with somewhat of a childlike affect as noted above.  VITAL SIGNS:  He is afebrile with normal vital signs.  Weight is 208 pounds.  No change in baseline.  Blood pressure is 120/80.  HEENT:  Ocular exam was limited, funduscopy revealed no obvious arterial  change.  Ear canals were clear bilaterally except for minimal retained wax  right greater than left.  NECK:  Supple without cervical adenopathy or tenderness.  Trachea is  midline, no thyromegaly.  Carotid upstrokes are brisk without any bruits.  CHEST:  Completely clear bilaterally to auscultation and percussion.  HEART:  There is a regular rhythm without murmur, gallop or rub present.  ABDOMEN:  Soft, benign, with no palpable organomegaly, mass or tenderness.  EXTREMITIES:  Warm without calf tenderness, cyanosis, clubbing, or edema.  Pedal pulses were present bilaterally.  NEUROLOGIC:  No focal deficits or  pathologic reflexes, although he did have  a very awkward gait as in the past with no change over the last year.  SKIN:  Unremarkable.   LABORATORY DATA:  CBC was normal.  Total cholesterol was 197 with an LDL of  143 and an HDL of 25.  Chemistry profile was remarkable for a fasting blood  sugar of 140.  TSH was normal.   IMPRESSION:  1. Hypertension is well controlled on his present regimen.  2. 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  3. Hyperlipidemia.  Target LDL is at goal at less than 130 based on      hypertension and positive family history.  I would like to see his HDL      driven higher but this would probably take either niacin or more      consistent aerobic exercise.  4. Obesity with no change in his weight compared to his target of less      than 195.  I discussed this issue with him again today.  5.  Bilateral wax retention, right greater than left, although not      occluding the canal.  We need to monitor this with each visit but I      recommended for now that he use Cerumenex.  6. Health maintenance.  He received both the tetanus and the Pneumovax in      January 2007, declined the flu shot.  7. Diverticulosis by colonoscopy in 2003, in the computer for recall 2008.  8. 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  Dr.      Patsi Sears has also been doing serial prostate evaluations.  9. History of hepatitis C with no elevation of LFTs.   Followup will be every 3 months, sooner if needed.    ______________________________  Calvin Dalton Sherene Sires, MD, [facility]    MBW/MedQ  DD: 03/20/2006  DT: 03/21/2006  Job #: [id number]

## 2010-10-23 ENCOUNTER — Other Ambulatory Visit: Payer: Self-pay | Admitting: Internal Medicine

## 2010-10-24 ENCOUNTER — Telehealth: Payer: Self-pay | Admitting: Internal Medicine

## 2010-10-24 MED ORDER — AMLODIPINE BESYLATE 10 MG PO TABS: 10.0000 mg | ORAL_TABLET | Freq: Every day | ORAL | Status: DC

## 2010-10-24 MED ORDER — FUROSEMIDE 20 MG PO TABS: 20.0000 mg | ORAL_TABLET | Freq: Every day | ORAL | Status: DC

## 2010-10-24 MED ORDER — BENAZEPRIL HCL 20 MG PO TABS: 20.0000 mg | ORAL_TABLET | Freq: Every day | ORAL | Status: DC

## 2010-10-24 NOTE — Telephone Encounter (Signed)
Patient called back stated that he was disconnected he has been out of his amlodirine besylate 210 mg, benazepril hcl 20mg  and furosemide 20 mg for 2 days. He called refill [facility] [address]. Patient wants to know why. He can be reached at [phone].Calvin Merritt

## 2010-10-24 NOTE — Telephone Encounter (Signed)
Spoke with pt and notified that rxs were denied b/c he was overdue to rov with MW- but will refill meds since he is going to come in tomorrow. I advised he should keep this appt for additional refills. Pt verbalized understanding.

## 2010-10-25 ENCOUNTER — Encounter: Payer: Self-pay | Admitting: Internal Medicine

## 2010-10-25 ENCOUNTER — Ambulatory Visit (INDEPENDENT_AMBULATORY_CARE_PROVIDER_SITE_OTHER): Payer: Medicare Other | Admitting: Internal Medicine

## 2010-10-25 VITALS — BP 132/88 | HR 76 | Temp 98.5°F | Ht 73.0 in | Wt 217.0 lb

## 2010-10-25 DIAGNOSIS — E785 Hyperlipidemia, unspecified: Secondary | ICD-10-CM

## 2010-10-25 DIAGNOSIS — I10 Essential (primary) hypertension: Secondary | ICD-10-CM

## 2010-10-25 MED ORDER — AMLODIPINE BESYLATE 10 MG PO TABS: 10.0000 mg | ORAL_TABLET | Freq: Every day | ORAL | Status: DC

## 2010-10-25 MED ORDER — FUROSEMIDE 20 MG PO TABS: 20.0000 mg | ORAL_TABLET | Freq: Every day | ORAL | Status: DC

## 2010-10-25 MED ORDER — BENAZEPRIL HCL 20 MG PO TABS: 20.0000 mg | ORAL_TABLET | Freq: Every day | ORAL | Status: DC

## 2010-10-25 NOTE — Assessment & Plan Note (Signed)
LDL 132 so Adequate control on present rx, reviewed

## 2010-10-25 NOTE — Progress Notes (Signed)
Subjective:     Patient ID: Calvin Merritt, male   DOB: 11-05-1940, 70 y.o.   MRN: [id number]  HPI 89 yowm never smoker with hypertension and hyperlipidemia and familial  spastic paraparesis Followed as Primary Care Patient/ [facility]/ Karson Reede   July 27, 2008--returns for 1 week follow up and lab review. CXR last ov w/ chronic changes. Labs showed crp 3.0 , LDL at goal of 115 on lipitor 10mg . Has bilateral wax impactions, to have ear wash today.   November 14, 2009--Returns for follow up and med review.  Fell off trailer 5/26 w/ left acetabular fx admitted [facility] w/ medical managment, discharged to rehab for 3 weeks. Discharged 2 days ago (6/26). Also right lower leg injury w/ old poor healed fx that required cast for support. Discharged on lovenox 30mg  two times a day to until 11/24/09.Marland Kitchen Has follow up with ortho 12/25/09. We have no discharge summary or hospital/rehab notes. I reveiewed the ER note day of admit. We are trying to get records. His HTN meds are unchanged. He stopped Lipitor >9 months ago. Home health w/ labs done yesterday -Intermim per pt. . PT in home is suppose to begin soon. He is on no weight bearing for left leg. Wife and family are with him today.    May 08, 2010 cpx no new co/s.  No change rx  10/25/2010  Ov/Joaquina Nissen no complaints x can't get meds for hbp s ov.  Pt denies any significant sore throat, dysphagia, itching, sneezing,  nasal congestion or excess/ purulent secretions,  fever, chills, sweats, unintended wt loss, pleuritic or exertional cp, hempoptysis, orthopnea pnd or leg swelling.    Also denies any obvious fluctuation of symptoms with weather or environmental changes or other aggravating or alleviating factors.     Allergies   No Known Drug Allergies    Past Medical History:  Hyperlipidemia  - Target LDL < 130 (hbp, male gender)  Hypertension  Spastic Paraparesis.............................................................................[facility]  Health Maintenance..............................................................................Marland KitchenWert  - DT 1/07  - Pneumovax 1/07 (age 30)  - CPX May 08, 2010  Hep C antibody positive  Diverticulosis  - Colonoscopy 01/29/2007  Left acetabular fx 10/12/09 w/ hospitalization/[facility]hab x4 weeks at [facility].............Marland KitchenNoralyn Pick   Past Surgical History:  Right nephrectomy 11/05.....................................................Marland KitchenTannenbaum  - 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 His mother had hypertension and died of an aneurysm. His father may have died of a heart attack at the age of 68.  spastic paraparesis both sisters   Social History:  Patient never smoked.  No ETOH  retired maintenance     Review of Systems     Objective:   Physical Exam     elderly wm healthy appearing in no acute distress- in wheelchair  wt 206 March 17, 2008 > 209 June 10, 2008 > 186 May 08, 2010 > 10/25/2010  217 HEENT: nl dentition, turbinates, and orophanx. partial wax in both ears with cough reflex noted  Neck without JVD/Nodes/TM/ nl carotids, no bruits.  Lungs clear to A and P bilaterally without cough on insp or exp maneuvers  RRR no s3 or murmur or increase in P2. trace edema.  Abd soft and benign with nl excursion in the supine position.  Ext warm without calf tenderness, cyanosis clubbing  Neuro able walk only with rolling walker Assessment:  Plan:

## 2010-10-25 NOTE — Assessment & Plan Note (Signed)
Adequate control on present rx, reviewed  

## 2010-10-25 NOTE — Patient Instructions (Signed)
Return for cpx after May 09 2011  Return to Dr Yetta Barre if you want your nail fungus treated

## 2010-11-25 ENCOUNTER — Other Ambulatory Visit: Payer: Self-pay | Admitting: Internal Medicine

## 2010-12-26 ENCOUNTER — Other Ambulatory Visit: Payer: Self-pay | Admitting: Internal Medicine

## 2011-10-30 ENCOUNTER — Telehealth: Payer: Self-pay | Admitting: Internal Medicine

## 2011-10-30 NOTE — Telephone Encounter (Signed)
Spoke with wife-aware to bring [employer] summons to our office for MW to take care of. She will bring on behalf of patient tomorrow.

## 2011-10-31 ENCOUNTER — Telehealth: Payer: Self-pay | Admitting: Internal Medicine

## 2011-11-01 ENCOUNTER — Encounter: Payer: Self-pay | Admitting: *Deleted

## 2011-11-01 NOTE — Telephone Encounter (Signed)
Jury excuse letter done- mailed copy to courthouse along with summons and also the pt was mailed copy. Spouse aware and states nothing further needed.

## 2011-11-16 ENCOUNTER — Other Ambulatory Visit: Payer: Self-pay | Admitting: Internal Medicine

## 2011-12-31 ENCOUNTER — Other Ambulatory Visit: Payer: Self-pay | Admitting: Internal Medicine

## 2012-01-14 ENCOUNTER — Ambulatory Visit (INDEPENDENT_AMBULATORY_CARE_PROVIDER_SITE_OTHER): Payer: Medicare Other | Admitting: Internal Medicine

## 2012-01-14 ENCOUNTER — Other Ambulatory Visit (INDEPENDENT_AMBULATORY_CARE_PROVIDER_SITE_OTHER): Payer: Medicare Other

## 2012-01-14 ENCOUNTER — Encounter: Payer: Self-pay | Admitting: Internal Medicine

## 2012-01-14 VITALS — BP 138/78 | HR 70 | Temp 98.2°F | Ht 73.0 in | Wt 209.0 lb

## 2012-01-14 DIAGNOSIS — E785 Hyperlipidemia, unspecified: Secondary | ICD-10-CM

## 2012-01-14 DIAGNOSIS — G114 Hereditary spastic paraplegia: Secondary | ICD-10-CM

## 2012-01-14 DIAGNOSIS — I10 Essential (primary) hypertension: Secondary | ICD-10-CM

## 2012-01-14 LAB — LIPID PANEL
HDL: 32.8 mg/dL — ABNORMAL LOW (ref >39.00)
Total CHOL/HDL Ratio: 7
Triglycerides: 228 mg/dL — ABNORMAL HIGH (ref 0.0–149.0)

## 2012-01-14 LAB — URINALYSIS, ROUTINE W REFLEX MICROSCOPIC
Ketones, ur: NEGATIVE
Nitrite: NEGATIVE
Specific Gravity, Urine: 1.015 (ref 1.000–1.030)
Urobilinogen, UA: 0.2 (ref 0.0–1.0)
pH: 6 (ref 5.0–8.0)

## 2012-01-14 LAB — BASIC METABOLIC PANEL
Calcium: 9.3 mg/dL (ref 8.4–10.5)
GFR: 66.5 mL/min (ref >60.00)
Potassium: 4 mEq/L (ref 3.5–5.1)
Sodium: 139 mEq/L (ref 135–145)

## 2012-01-14 LAB — TSH: TSH: 2.59 u[IU]/mL (ref 0.35–5.50)

## 2012-01-14 LAB — LDL CHOLESTEROL, DIRECT: Direct LDL: 142 mg/dL

## 2012-01-14 NOTE — Progress Notes (Signed)
Subjective:     Patient ID: Calvin Merritt, male   DOB: 1940/10/13, 71 y.o.   MRN: [id number]  HPI 75 yowm never smoker with hypertension and hyperlipidemia and familial  spastic paraparesis Followed as Primary Care Patient/ [facility]/ [facility]   July 27, 2008--returns for 1 week follow up and lab review. CXR last ov w/ chronic changes. Labs showed crp 3.0 , LDL at goal of 115 on lipitor 10mg . Has bilateral wax impactions, to have ear wash today.   November 14, 2009--Returns for follow up and med review.  Fell off trailer 5/26 w/ left acetabular fx admitted [facility] w/ medical managment, discharged to rehab for 3 weeks. Discharged 2 days ago (6/26). Also right lower leg injury w/ old poor healed fx that required cast for support. Discharged on lovenox 30mg  two times a day to until 11/24/09.Marland Kitchen Has follow up with ortho 12/25/09. We have no discharge summary or hospital/rehab notes. I reveiewed the ER note day of admit. We are trying to get records. His HTN meds are unchanged. He stopped Lipitor >9 months ago. Home health w/ labs done yesterday -Intermim per pt. . PT in home is suppose to begin soon. He is on no weight bearing for left leg. Wife and family are with him today.    May 08, 2010 cpx no new co/s.  No change rx  10/25/2010  Ov/[facility] no complaints x can't get meds for hbp s ov.    rec Return for cpx after May 09 2011 Return to Dr Calvin Merritt if you want your nail fungus treated  01/14/2012 f/u ov/[facility] cc f/u hbp, no sob, tia or claudication symptoms- limited by paraparesis  Sleeping ok without nocturnal  or early am exacerbation  of respiratory  c/o's  Also denies any obvious fluctuation of symptoms with weather or environmental changes or other aggravating or alleviating factors except as outlined above   Allergies   No Known Drug Allergies    Past Medical History:  Hyperlipidemia  - Target LDL < 130 (hbp, male gender)  Hypertension  Spastic  Paraparesis.............................................................................Marland KitchenGuilford [facility]itt(Calvin [facility])  Health Maintenance..............................................................................Marland KitchenWert  - DT 1/07  - Pneumovax 1/07 (age 86)  - CPX May 08, 2010  Hep C antibody positive  Diverticulosis  - Colonoscopy 01/29/2007  Left acetabular fx 10/12/09 w/ hospitalization/rehab x4 weeks at [facility].............Marland KitchenNoralyn Merritt   Past Surgical History:  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 His mother had hypertension and died of an aneurysm. His father may have died of a heart attack at the age of 46.  spastic paraparesis both sisters   Social History:  Patient never smoked.  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 partial wax in both ears with cough reflex noted  Neck without JVD/Nodes/TM/ nl carotids, no bruits.  Lungs clear to A and P bilaterally without cough on insp or exp maneuvers  RRR no s3 or murmur or increase in P2. trace edema.  Abd soft and benign with nl excursion in the supine position.  Ext warm without calf tenderness, cyanosis clubbing  Neuro able walk only with rolling walker Assessment:         Plan:

## 2012-01-14 NOTE — Patient Instructions (Addendum)
Please schedule a follow up visit in 6 months but call sooner if needed with CPX

## 2012-01-15 NOTE — Assessment & Plan Note (Signed)
Adequate control on present rx, reviewed  

## 2012-01-15 NOTE — Assessment & Plan Note (Signed)
High risk falls reviewed

## 2012-01-15 NOTE — Assessment & Plan Note (Signed)
-   Target LDL < 130 (hbp, male gender)   Adequate control on present rx, reviewed  Options > diet best one for now

## 2012-02-02 ENCOUNTER — Other Ambulatory Visit: Payer: Self-pay | Admitting: Internal Medicine

## 2012-04-28 ENCOUNTER — Telehealth: Payer: Self-pay | Admitting: Internal Medicine

## 2012-04-29 NOTE — Telephone Encounter (Signed)
I spoke with spouse and she stated she will speak with spouse and see if she can get him to schedule appt. She will call back to get this scheduled. Will sign off message

## 2012-04-29 NOTE — Telephone Encounter (Signed)
Spoke with pt's wife and she would like to speak with Dr Sherene Sires about her husband's odd behaviour in the past 2 weeks.  Would not give any more details just wants to speak with Dr Sherene Sires.

## 2012-04-29 NOTE — Telephone Encounter (Signed)
Pt with paranoid behavior but no threats or violent behavior reported   rec  Ov asap to ask pt to consider pysch eval

## 2012-04-29 NOTE — Telephone Encounter (Signed)
Pt's wife calling again in ref to previous msg can be reached at [phone].Calvin [facility]itt

## 2012-06-22 ENCOUNTER — Other Ambulatory Visit (INDEPENDENT_AMBULATORY_CARE_PROVIDER_SITE_OTHER): Payer: Medicare Other

## 2012-06-22 ENCOUNTER — Ambulatory Visit (INDEPENDENT_AMBULATORY_CARE_PROVIDER_SITE_OTHER): Payer: Medicare Other | Admitting: Internal Medicine

## 2012-06-22 ENCOUNTER — Ambulatory Visit (INDEPENDENT_AMBULATORY_CARE_PROVIDER_SITE_OTHER)
Admission: RE | Admit: 2012-06-22 | Discharge: 2012-06-22 | Disposition: A | Discharge status: Home / Self Care | Payer: Medicare Other | Source: Ambulatory Visit | Attending: Internal Medicine | Admitting: Internal Medicine

## 2012-06-22 ENCOUNTER — Encounter: Payer: Self-pay | Admitting: Internal Medicine

## 2012-06-22 VITALS — BP 130/88 | HR 80 | Temp 98.4°F | Ht 73.0 in | Wt 207.4 lb

## 2012-06-22 DIAGNOSIS — I10 Essential (primary) hypertension: Secondary | ICD-10-CM

## 2012-06-22 DIAGNOSIS — G114 Hereditary spastic paraplegia: Secondary | ICD-10-CM

## 2012-06-22 DIAGNOSIS — E785 Hyperlipidemia, unspecified: Secondary | ICD-10-CM

## 2012-06-22 DIAGNOSIS — G4733 Obstructive sleep apnea (adult) (pediatric): Secondary | ICD-10-CM

## 2012-06-22 LAB — BASIC METABOLIC PANEL
BUN: 9 mg/dL (ref 6–23)
CO2: 28 mEq/L (ref 19–32)
Glucose, Bld: 145 mg/dL — ABNORMAL HIGH (ref 70–99)
Potassium: 4.7 mEq/L (ref 3.5–5.1)
Sodium: 140 mEq/L (ref 135–145)

## 2012-06-22 LAB — HEPATIC FUNCTION PANEL
ALT: 25 U/L (ref 0–53)
Bilirubin, Direct: 0.1 mg/dL (ref 0.0–0.3)
Total Bilirubin: 0.9 mg/dL (ref 0.3–1.2)

## 2012-06-22 LAB — LIPID PANEL
HDL: 34.2 mg/dL — ABNORMAL LOW (ref >39.00)
Total CHOL/HDL Ratio: 6
Triglycerides: 184 mg/dL — ABNORMAL HIGH (ref 0.0–149.0)

## 2012-06-22 MED ORDER — TADALAFIL 10 MG PO TABS: 10.0000 mg | ORAL_TABLET | Freq: Every day | ORAL | Status: DC | PRN

## 2012-06-22 NOTE — Progress Notes (Signed)
Subjective:     Patient ID: Calvin Merritt, male   DOB: February 14, 1941    MRN: [id number]  HPI 72 yowm never smoker with hypertension and hyperlipidemia and familial  spastic paraparesis Followed as Primary Care Patient/ [name]/ Calvin [facility]   July 27, 2008--returns for 1 week follow up and lab review. CXR last ov w/ chronic changes. Labs showed crp 3.0 , LDL at goal of 115 on lipitor 10mg . Has bilateral wax impactions, to have ear wash today.   November 14, 2009--Returns for follow up and med review.  Fell off trailer 5/26 w/ left acetabular fx admitted [facility] w/ medical managment, discharged to rehab for 3 weeks. Discharged 2 days ago (6/26). Also right lower leg injury w/ old poor healed fx that required cast for support. Discharged on lovenox 30mg  two times a day to until 11/24/09.Marland Kitchen Has follow up with ortho 12/25/09. We have no discharge summary or hospital/rehab notes. I reveiewed the ER note day of admit. We are trying to get records. His HTN meds are unchanged. He stopped Lipitor >9 months ago. Home health w/ labs done yesterday -Intermim per pt. . PT in home is suppose to begin soon. He is on no weight bearing for left leg. Wife and family are with him today.    May 08, 2010 cpx no new co/s.  No change rx  10/25/2010  Ov/[facility] no complaints x can't get meds for hbp s ov.    rec Return for cpx after May 09 2011 Return to Dr Calvin Merritt if you want your nail fungus treated  01/14/2012 f/u ov/[facility] cc f/u hbp, no sob, tia or claudication symptoms- limited by paraparesis rec No change rx.  06/22/2012 f/u ov/[facility] re hbp, hyperlipidemia cc ED x months, wants to try cialis. Appears to be tension between pt and spouse over this issue but would not elaborate.  No problem with urination. Walking with rolling walker.   Sleeping ok without nocturnal  or early am exacerbation  of respiratory  c/o's  Also denies any obvious fluctuation of symptoms with weather or environmental changes or other aggravating or  alleviating factors except as outlined above   ROS  The following are not active complaints unless bolded sore throat, dysphagia, dental problems, itching, sneezing,  nasal congestion or excess/ purulent secretions, ear ache,   fever, chills, sweats, unintended wt loss, pleuritic or exertional cp, hemoptysis,  orthopnea pnd or leg swelling, presyncope, palpitations, heartburn, abdominal pain, anorexia, nausea, vomiting, diarrhea  or change in bowel or urinary habits, change in stools or urine, dysuria,hematuria,  rash, arthralgias, visual complaints, headache, numbness weakness or ataxia or problems with walking or coordination,  change in mood/affect or memory.      Allergies   No Known Drug Allergies    Past Medical History:  Hyperlipidemia  - Target LDL < 130 (hbp, male gender)  Hypertension  Spastic Paraparesis.............................................................................Marland KitchenGuilford Merritt   Health Maintenance..............................................................................Marland KitchenWert  - DT 05/2005 - Pneumovax 05/2005 (age 72)  - CPX May 08, 2010  Hep C antibody positive  Diverticulosis  - Colonoscopy 01/29/2007  Left acetabular fx 10/12/09 w/ hospitalization/rehab x4 weeks at [facility].............Marland KitchenNoralyn Merritt   Past Surgical History:  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 His mother had hypertension and died of an aneurysm. His father may have died of a heart attack  at the age of 64.  spastic paraparesis both sisters   Social History:  Patient never smoked.  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 partial wax in both ears with cough reflex noted  Neck without JVD/Nodes/TM/ nl carotids, no bruits.  Lungs clear to A and P bilaterally without cough on insp or exp maneuvers  RRR no s3 or murmur or increase in P2. trace edema.  Abd soft and benign with nl excursion in the supine position.  Ext warm without calf tenderness, cyanosis clubbing  Neuro able walk only with rolling walker  CXR  06/22/2012 :  1. No acute cardiopulmonary process. 2. Low lung volumes. 3. Osteophytosis of the spine.   Assessment:         Plan:

## 2012-06-22 NOTE — Patient Instructions (Addendum)
Please remember to go to the lab   department downstairs for your tests - we will call you with the results when they are available.  Please schedule a follow up visit in 6 months but call sooner if needed     

## 2012-06-23 NOTE — Assessment & Plan Note (Signed)
F/u Clance rec

## 2012-06-23 NOTE — Assessment & Plan Note (Signed)
Doing better with mobilization with rolling walker but strongly rec he get re-eval by [facility] to be sure he gets the latest info and rx options/ PT/OT etc

## 2012-06-23 NOTE — Assessment & Plan Note (Signed)
Adequate control on present rx, reviewed   Ok for cialis rx  See instructions for specific recommendations which were reviewed directly with the patient who was given a copy with highlighter outlining the key components.

## 2012-06-23 NOTE — Assessment & Plan Note (Signed)
-   Target LDL < 130 (hbp, male gender)   Lab Results  Component Value Date   CHOL 219* 06/22/2012   HDL 34.20* 06/22/2012   LDLCALC 132* 12/22/2008   LDLDIRECT 135.0 06/22/2012   TRIG 184.0* 06/22/2012   CHOLHDL 6 06/22/2012    Adequate control on present rx, reviewed

## 2012-06-24 ENCOUNTER — Telehealth: Payer: Self-pay | Admitting: Internal Medicine

## 2012-06-24 MED ORDER — SILDENAFIL CITRATE 50 MG PO TABS: 50.0000 mg | ORAL_TABLET | Freq: Every day | ORAL | Status: DC | PRN

## 2012-06-24 NOTE — Addendum Note (Signed)
Addended by: Boone Master E on: 06/24/2012 02:33 PM   Modules accepted: Orders

## 2012-06-24 NOTE — Telephone Encounter (Signed)
Rx sent to [facility]. LMOM TCB x1 to inform pt.

## 2012-06-24 NOTE — Telephone Encounter (Signed)
Patient states the cialis is too expensive and is requesting an rx be sent for viagra instead. Dr. Sherene Sires is this an ok alternative? Please advise. Carron Curie, CMA

## 2012-06-24 NOTE — Telephone Encounter (Signed)
Ok to give viagra 50 mg # 10 refill x 5

## 2012-08-25 ENCOUNTER — Other Ambulatory Visit: Payer: Self-pay | Admitting: Internal Medicine

## 2012-08-26 ENCOUNTER — Other Ambulatory Visit: Payer: Self-pay | Admitting: Internal Medicine

## 2012-12-02 ENCOUNTER — Other Ambulatory Visit: Payer: Self-pay | Admitting: Internal Medicine

## 2012-12-02 MED ORDER — BENAZEPRIL HCL 20 MG PO TABS: ORAL_TABLET | ORAL | Status: DC

## 2012-12-02 MED ORDER — AMLODIPINE BESYLATE 10 MG PO TABS: ORAL_TABLET | ORAL | Status: DC

## 2012-12-02 MED ORDER — FUROSEMIDE 20 MG PO TABS: ORAL_TABLET | ORAL | Status: DC

## 2012-12-22 ENCOUNTER — Encounter: Payer: Self-pay | Admitting: Internal Medicine

## 2012-12-22 ENCOUNTER — Ambulatory Visit (INDEPENDENT_AMBULATORY_CARE_PROVIDER_SITE_OTHER): Payer: Medicare Other

## 2012-12-22 ENCOUNTER — Ambulatory Visit (INDEPENDENT_AMBULATORY_CARE_PROVIDER_SITE_OTHER): Payer: Medicare Other | Admitting: Internal Medicine

## 2012-12-22 VITALS — BP 122/88 | HR 84 | Temp 98.3°F | Ht 73.0 in | Wt 189.0 lb

## 2012-12-22 DIAGNOSIS — I10 Essential (primary) hypertension: Secondary | ICD-10-CM

## 2012-12-22 DIAGNOSIS — E785 Hyperlipidemia, unspecified: Secondary | ICD-10-CM

## 2012-12-22 DIAGNOSIS — G114 Hereditary spastic paraplegia: Secondary | ICD-10-CM

## 2012-12-22 LAB — LIPID PANEL
Cholesterol: 210 mg/dL — ABNORMAL HIGH (ref 0–200)
Total CHOL/HDL Ratio: 6

## 2012-12-22 LAB — BASIC METABOLIC PANEL
BUN: 16 mg/dL (ref 6–23)
Calcium: 9.9 mg/dL (ref 8.4–10.5)
Creatinine, Ser: 1.4 mg/dL (ref 0.4–1.5)
GFR: 52.43 mL/min — ABNORMAL LOW (ref >60.00)

## 2012-12-22 NOTE — Progress Notes (Signed)
Subjective:     Patient ID: Calvin Merritt, male   DOB: 07/04/1940    MRN: [id number]  Brief patient profile:  72 yowm never smoker with hypertension and hyperlipidemia and familial  spastic paraparesis Followed as Primary Care Patient/ [facility]/ [facility]       HPI  06/22/2012 f/u ov/[facility] re hbp, hyperlipidemia cc ED x months, wants to try cialis. Appears to be tension between pt and spouse over this issue but would not elaborate.  No problem with urination. Walking with rolling walker. rec No change rx x trial of viagra > never tried it   12/22/2012 f/u ov/[facility] re hbp, hyperlipidemia, spastic paraplegia Chief Complaint  Patient presents with  . 6 month follow up    No complaints    No change gait issues, walking with two canes.  Reports wife moved out since previous ov, never tried ED drugs, denies depression but not cooking that well for himself and loosing wt  Sleeping ok without nocturnal  or early am exacerbation  of respiratory  c/o's  Also denies any obvious fluctuation of symptoms with weather or environmental changes or other aggravating or alleviating factors except as outlined above   ROS  The following are not active complaints unless bolded sore throat, dysphagia, dental problems, itching, sneezing,  nasal congestion or excess/ purulent secretions, ear ache,   fever, chills, sweats, unintended wt loss, pleuritic or exertional cp, hemoptysis,  orthopnea pnd or leg swelling, presyncope, palpitations, heartburn, abdominal pain, anorexia, nausea, vomiting, diarrhea  or change in bowel or urinary habits, change in stools or urine, dysuria,hematuria,  rash, arthralgias, visual complaints, headache, numbness weakness or ataxia or problems with walking or coordination,  change in mood/affect or memory.      Allergies   No Known Drug Allergies    Past Medical History:  Hyperlipidemia  - Target LDL < 130 (hbp, male gender)  Hypertension  Spastic  Paraparesis.............................................................................[facility]   Health Maintenance..............................................................................Marland KitchenWert  - DT 05/2005 - Pneumovax 05/2005 (age 72)  - CPX May 08, 2010  Hep C antibody positive  Diverticulosis  - Colonoscopy 01/29/2007  Left acetabular fx 10/12/09 w/ hospitalization/rehab x4 weeks at [facility].............Marland KitchenNoralyn Pick   Past Surgical History:  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 His mother had hypertension and died of an aneurysm. His father may have died of a heart attack at the age of 46.  spastic paraparesis both sisters   Social History:  Patient never smoked.  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 partial wax in both ears with cough reflex noted  Neck without JVD/Nodes/TM/ nl carotids, no bruits.  Lungs clear to A and P bilaterally without cough on insp or exp maneuvers  RRR no s3 or murmur or increase in P2. trace edema.  Abd soft and benign with nl excursion in the supine position.  Ext warm without calf tenderness, cyanosis clubbing  Neuro able walk only with rolling walker  CXR  06/22/2012 :  1. No acute cardiopulmonary process. 2. Low lung volumes. 3. Osteophytosis of the spine.   Assessment:

## 2012-12-22 NOTE — Patient Instructions (Addendum)
Please remember to go to the lab  department downstairs for your tests - we will call you with the results when they are available.     Return 6 months for CPX

## 2012-12-24 NOTE — Progress Notes (Signed)
Quick Note:  Spoke with pt and notified of results per Dr. [name]. Pt verbalized understanding and denied any questions.  ______ 

## 2012-12-25 NOTE — Assessment & Plan Note (Signed)
-   Target LDL < 130 (hbp, male gender)   Lab Results  Component Value Date   CHOL 210* 12/22/2012   HDL 33.70* 12/22/2012   LDLCALC 132* 12/22/2008   LDLDIRECT 142.8 12/22/2012   TRIG 188.0* 12/22/2012   CHOLHDL 6 12/22/2012     Adequate control on present rx, reviewed > no change in rx needed

## 2012-12-25 NOTE — Assessment & Plan Note (Signed)
F/u [name]  Ambulating with 2 canes adequately, falling precautions reviewed

## 2012-12-25 NOTE — Assessment & Plan Note (Signed)
Renal fx fine,  Adequate control on present rx, reviewed > no change in rx needed

## 2013-08-19 ENCOUNTER — Encounter: Payer: Self-pay | Admitting: Internal Medicine

## 2013-08-19 ENCOUNTER — Ambulatory Visit (INDEPENDENT_AMBULATORY_CARE_PROVIDER_SITE_OTHER): Payer: Medicare Other | Admitting: Internal Medicine

## 2013-08-19 ENCOUNTER — Other Ambulatory Visit (INDEPENDENT_AMBULATORY_CARE_PROVIDER_SITE_OTHER): Payer: Medicare Other

## 2013-08-19 ENCOUNTER — Ambulatory Visit (INDEPENDENT_AMBULATORY_CARE_PROVIDER_SITE_OTHER)
Admission: RE | Admit: 2013-08-19 | Discharge: 2013-08-19 | Disposition: A | Discharge status: Home / Self Care | Payer: Medicare Other | Source: Ambulatory Visit | Attending: Internal Medicine | Admitting: Internal Medicine

## 2013-08-19 VITALS — BP 154/74 | HR 60 | Temp 98.0°F | Ht 73.0 in | Wt 185.0 lb

## 2013-08-19 DIAGNOSIS — Z23 Encounter for immunization: Secondary | ICD-10-CM

## 2013-08-19 DIAGNOSIS — I10 Essential (primary) hypertension: Secondary | ICD-10-CM

## 2013-08-19 DIAGNOSIS — Z0000 Encounter for general adult medical examination without abnormal findings: Secondary | ICD-10-CM

## 2013-08-19 DIAGNOSIS — G114 Hereditary spastic paraplegia: Secondary | ICD-10-CM

## 2013-08-19 DIAGNOSIS — E785 Hyperlipidemia, unspecified: Secondary | ICD-10-CM

## 2013-08-19 LAB — CBC WITH DIFFERENTIAL/PLATELET
BASOS ABS: 0.1 10*3/uL (ref 0.0–0.1)
BASOS PCT: 0.5 % (ref 0.0–3.0)
EOS ABS: 0.3 10*3/uL (ref 0.0–0.7)
Eosinophils Relative: 2.4 % (ref 0.0–5.0)
HCT: 48.4 % (ref 39.0–52.0)
Hemoglobin: 16.3 g/dL (ref 13.0–17.0)
LYMPHS PCT: 33.8 % (ref 12.0–46.0)
Lymphs Abs: 3.9 10*3/uL (ref 0.7–4.0)
MCHC: 33.8 g/dL (ref 30.0–36.0)
MCV: 88.8 fl (ref 78.0–100.0)
Monocytes Absolute: 0.7 10*3/uL (ref 0.1–1.0)
Monocytes Relative: 6.1 % (ref 3.0–12.0)
NEUTROS ABS: 6.6 10*3/uL (ref 1.4–7.7)
NEUTROS PCT: 57.2 % (ref 43.0–77.0)
Platelets: 357 10*3/uL (ref 150.0–400.0)
RBC: 5.45 Mil/uL (ref 4.22–5.81)
RDW: 13.2 % (ref 11.5–14.6)
WBC: 11.5 10*3/uL — ABNORMAL HIGH (ref 4.5–10.5)

## 2013-08-19 LAB — HEPATIC FUNCTION PANEL
ALBUMIN: 4 g/dL (ref 3.5–5.2)
ALK PHOS: 58 U/L (ref 39–117)
ALT: 14 U/L (ref 0–53)
AST: 14 U/L (ref 0–37)
BILIRUBIN DIRECT: 0.1 mg/dL (ref 0.0–0.3)
TOTAL PROTEIN: 7.2 g/dL (ref 6.0–8.3)
Total Bilirubin: 0.9 mg/dL (ref 0.3–1.2)

## 2013-08-19 LAB — URINALYSIS, ROUTINE W REFLEX MICROSCOPIC
Bilirubin Urine: NEGATIVE
Hgb urine dipstick: NEGATIVE
Ketones, ur: NEGATIVE
Nitrite: NEGATIVE
PH: 6 (ref 5.0–8.0)
SPECIFIC GRAVITY, URINE: 1.025 (ref 1.000–1.030)
URINE GLUCOSE: NEGATIVE
Urobilinogen, UA: 1 (ref 0.0–1.0)

## 2013-08-19 LAB — LIPID PANEL
Cholesterol: 217 mg/dL — ABNORMAL HIGH (ref 0–200)
HDL: 37.8 mg/dL — ABNORMAL LOW (ref >39.00)
LDL Cholesterol: 140 mg/dL — ABNORMAL HIGH (ref 0–99)
TRIGLYCERIDES: 195 mg/dL — AB (ref 0.0–149.0)
Total CHOL/HDL Ratio: 6
VLDL: 39 mg/dL (ref 0.0–40.0)

## 2013-08-19 LAB — BASIC METABOLIC PANEL
BUN: 12 mg/dL (ref 6–23)
CALCIUM: 9.4 mg/dL (ref 8.4–10.5)
CHLORIDE: 102 meq/L (ref 96–112)
CO2: 28 mEq/L (ref 19–32)
CREATININE: 1.1 mg/dL (ref 0.4–1.5)
GFR: 68.26 mL/min (ref >60.00)
GLUCOSE: 135 mg/dL — AB (ref 70–99)
Potassium: 3.7 mEq/L (ref 3.5–5.1)
Sodium: 138 mEq/L (ref 135–145)

## 2013-08-19 LAB — TSH: TSH: 2.74 u[IU]/mL (ref 0.35–5.50)

## 2013-08-19 NOTE — Progress Notes (Signed)
Subjective:     Patient ID: Calvin Merritt, male   DOB: 05-14-1941    MRN: [id number]   Brief patient profile:  73 yowm never smoker with hypertension and hyperlipidemia and familial  spastic paraparesis Followed as Primary Care Patient/ [facility]/ [facility]      History of Present Illness   08/19/2013 f/u ov/[name] re:  hbp, hyperlipidemia, spastic paraplegia Chief Complaint  Patient presents with  . Annual Exam    Pt here for physical.  Is requesting hip x-ray.  Fallen 3X since lv.  Also states his R index and middle finger feel numb X7-8 mos.     No change gait issues, walking with two canes.  Reports wife moved out since previous ov, never tried ED drugs, denies depression but not cooking that well for himself and loosing wt  Sleeping ok without nocturnal  or early am exacerbation  of respiratory  c/o's  Also denies any obvious fluctuation of symptoms with weather or environmental changes or other aggravating or alleviating factors except as outlined above   ROS  The following are not active complaints unless bolded sore throat, dysphagia, dental problems, itching, sneezing,  nasal congestion or excess/ purulent secretions, ear ache,   fever, chills, sweats, unintended wt loss, pleuritic or exertional cp, hemoptysis,  orthopnea pnd or leg swelling, presyncope, palpitations, heartburn, abdominal pain, anorexia, nausea, vomiting, diarrhea  or change in bowel or urinary habits, change in stools or urine, dysuria,hematuria,  rash, arthralgias, visual complaints, headache, numbness weakness or ataxia or problems with walking or coordination,  change in mood/affect or memory.      Allergies   No Known Drug Allergies    Past Medical History:  Hyperlipidemia  - Target LDL < 130 (hbp, male gender)  Hypertension  Spastic Paraparesis.............................................................................[facility]   Health  Maintenance..............................................................................Marland KitchenWert  - DT 05/2005 - Pneumovax 05/2005 (age 73) - prevnar 08/19/2013  - CPX  08/19/2013  Hep C antibody positive  Diverticulosis  - Colonoscopy 01/29/2007  Left acetabular fx 10/12/09 w/ hospitalization/rehab x4 weeks at 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 His mother had hypertension and died of an aneurysm. His father may have died of a heart attack at the age of 80.  spastic paraparesis both sisters   Social History:  Patient never smoked.  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 partial wax in both ears with cough reflex noted  Neck without JVD/Nodes/TM/ nl carotids, no bruits.  Lungs clear to A and P bilaterally without cough on insp or exp maneuvers  RRR no s3 or murmur or increase in P2. trace edema.  Abd soft and benign with nl excursion in the supine position.  Ext warm without calf tenderness, cyanosis clubbing  Neuro able walk with two canes, very awkward  GU Uncirc/retracted penis, no nodules, IH Rectal:  Mild bph smooth texture      CXR  08/19/2013 :  No active cardiopulmonary disease.    Recent Labs Lab 08/19/13 0939  NA 138  K 3.7  CL 102  CO2 28  BUN 12  CREATININE 1.1  GLUCOSE 135*    Recent Labs Lab 08/19/13 0939  HGB 16.3  HCT 48.4   WBC 11.5*  PLT 357.0       Assessment:

## 2013-08-19 NOTE — Patient Instructions (Addendum)
Please remember to go to the lab and x-ray department downstairs for your tests - we will call you with the results when they are available.  No change in medications  prevnar today  Please schedule a follow up visit in 6 months but call sooner if needed

## 2013-08-19 NOTE — Progress Notes (Signed)
Quick Note:  Spoke with pt and notified of results per Dr. [name]. Pt verbalized understanding and denied any questions.  ______ 

## 2013-08-21 DIAGNOSIS — Z0000 Encounter for general adult medical examination without abnormal findings: Secondary | ICD-10-CM | POA: Insufficient documentation

## 2013-08-21 NOTE — Assessment & Plan Note (Signed)
Increased falls reviewed with pt, declines PT or neuro re - eval, warned at risk of more serious events

## 2013-08-21 NOTE — Assessment & Plan Note (Signed)
-   Prevar given 08/19/13 as over 65 and already received pneumovax

## 2013-08-21 NOTE — Assessment & Plan Note (Addendum)
  Recent Labs Lab 08/19/13 0939  NA 138  K 3.7  CL 102  CO2 28  BUN 12  CREATININE 1.1  GLUCOSE 135*    Adequate control on present rx, reviewed > no change in rx needed

## 2013-08-21 NOTE — Assessment & Plan Note (Signed)
-   Target LDL < 130 (hbp, male gender  Lab Results  Component Value Date   CHOL 217* 08/19/2013   HDL 37.80* 08/19/2013   LDLCALC 140* 08/19/2013   LDLDIRECT 142.8 12/22/2012   TRIG 195.0* 08/19/2013   CHOLHDL 6 08/19/2013     Adequate though not optimal  control on present rx, reviewed > no change in rx needed

## 2013-09-19 ENCOUNTER — Other Ambulatory Visit: Payer: Self-pay | Admitting: Internal Medicine

## 2013-12-31 ENCOUNTER — Emergency Department (HOSPITAL_COMMUNITY): Payer: Medicare Other

## 2013-12-31 ENCOUNTER — Inpatient Hospital Stay (HOSPITAL_COMMUNITY)
Admission: EM | Admit: 2013-12-31 | Discharge: 2014-01-05 | DRG: 041 | Disposition: A | Discharge status: Rehabilitation Facility | Payer: Medicare Other | Attending: Neurology | Admitting: Neurology

## 2013-12-31 ENCOUNTER — Encounter (HOSPITAL_COMMUNITY): Payer: Self-pay | Admitting: Emergency Medicine

## 2013-12-31 DIAGNOSIS — Y9210 Unspecified residential institution as the place of occurrence of the external cause: Secondary | ICD-10-CM | POA: Diagnosis not present

## 2013-12-31 DIAGNOSIS — I6340 Cerebral infarction due to embolism of unspecified cerebral artery: Secondary | ICD-10-CM | POA: Diagnosis present

## 2013-12-31 DIAGNOSIS — R220 Localized swelling, mass and lump, head: Secondary | ICD-10-CM | POA: Diagnosis present

## 2013-12-31 DIAGNOSIS — I6350 Cerebral infarction due to unspecified occlusion or stenosis of unspecified cerebral artery: Secondary | ICD-10-CM

## 2013-12-31 DIAGNOSIS — F3289 Other specified depressive episodes: Secondary | ICD-10-CM | POA: Diagnosis present

## 2013-12-31 DIAGNOSIS — R221 Localized swelling, mass and lump, neck: Secondary | ICD-10-CM | POA: Diagnosis present

## 2013-12-31 DIAGNOSIS — G8190 Hemiplegia, unspecified affecting unspecified side: Secondary | ICD-10-CM | POA: Diagnosis present

## 2013-12-31 DIAGNOSIS — R4789 Other speech disturbances: Secondary | ICD-10-CM | POA: Diagnosis present

## 2013-12-31 DIAGNOSIS — Z7982 Long term (current) use of aspirin: Secondary | ICD-10-CM

## 2013-12-31 DIAGNOSIS — E785 Hyperlipidemia, unspecified: Secondary | ICD-10-CM | POA: Diagnosis present

## 2013-12-31 DIAGNOSIS — I671 Cerebral aneurysm, nonruptured: Secondary | ICD-10-CM | POA: Diagnosis present

## 2013-12-31 DIAGNOSIS — I10 Essential (primary) hypertension: Secondary | ICD-10-CM | POA: Diagnosis present

## 2013-12-31 DIAGNOSIS — I350 Nonrheumatic aortic (valve) stenosis: Secondary | ICD-10-CM

## 2013-12-31 DIAGNOSIS — Q2112 Patent foramen ovale: Secondary | ICD-10-CM

## 2013-12-31 DIAGNOSIS — K1370 Unspecified lesions of oral mucosa: Secondary | ICD-10-CM | POA: Diagnosis not present

## 2013-12-31 DIAGNOSIS — F329 Major depressive disorder, single episode, unspecified: Secondary | ICD-10-CM | POA: Diagnosis present

## 2013-12-31 DIAGNOSIS — E876 Hypokalemia: Secondary | ICD-10-CM

## 2013-12-31 DIAGNOSIS — G4733 Obstructive sleep apnea (adult) (pediatric): Secondary | ICD-10-CM | POA: Diagnosis present

## 2013-12-31 DIAGNOSIS — R471 Dysarthria and anarthria: Secondary | ICD-10-CM | POA: Diagnosis present

## 2013-12-31 DIAGNOSIS — Q2111 Secundum atrial septal defect: Secondary | ICD-10-CM

## 2013-12-31 DIAGNOSIS — T45615A Adverse effect of thrombolytic drugs, initial encounter: Secondary | ICD-10-CM | POA: Diagnosis not present

## 2013-12-31 DIAGNOSIS — I359 Nonrheumatic aortic valve disorder, unspecified: Secondary | ICD-10-CM | POA: Diagnosis present

## 2013-12-31 DIAGNOSIS — Z79899 Other long term (current) drug therapy: Secondary | ICD-10-CM

## 2013-12-31 DIAGNOSIS — Z5189 Encounter for other specified aftercare: Secondary | ICD-10-CM | POA: Diagnosis not present

## 2013-12-31 DIAGNOSIS — F32A Depression, unspecified: Secondary | ICD-10-CM | POA: Diagnosis present

## 2013-12-31 DIAGNOSIS — Q211 Atrial septal defect: Secondary | ICD-10-CM

## 2013-12-31 DIAGNOSIS — K118 Other diseases of salivary glands: Secondary | ICD-10-CM | POA: Diagnosis present

## 2013-12-31 DIAGNOSIS — I639 Cerebral infarction, unspecified: Secondary | ICD-10-CM

## 2013-12-31 HISTORY — DX: Nonrheumatic aortic (valve) stenosis: I35.0

## 2013-12-31 HISTORY — DX: Atrial septal defect: Q21.1

## 2013-12-31 HISTORY — DX: Cerebral infarction due to embolism of unspecified cerebral artery: I63.40

## 2013-12-31 LAB — CBC
HEMATOCRIT: 45.2 % (ref 39.0–52.0)
Hemoglobin: 15.6 g/dL (ref 13.0–17.0)
MCH: 29.7 pg (ref 26.0–34.0)
MCHC: 34.5 g/dL (ref 30.0–36.0)
MCV: 86.1 fL (ref 78.0–100.0)
PLATELETS: 300 10*3/uL (ref 150–400)
RBC: 5.25 MIL/uL (ref 4.22–5.81)
RDW: 13.3 % (ref 11.5–15.5)
WBC: 15.5 10*3/uL — ABNORMAL HIGH (ref 4.0–10.5)

## 2013-12-31 LAB — DIFFERENTIAL
Basophils Absolute: 0 10*3/uL (ref 0.0–0.1)
Basophils Relative: 0 % (ref 0–1)
EOS ABS: 0.2 10*3/uL (ref 0.0–0.7)
EOS PCT: 1 % (ref 0–5)
LYMPHS PCT: 20 % (ref 12–46)
Lymphs Abs: 3.1 10*3/uL (ref 0.7–4.0)
MONO ABS: 1 10*3/uL (ref 0.1–1.0)
Monocytes Relative: 6 % (ref 3–12)
Neutro Abs: 11.2 10*3/uL — ABNORMAL HIGH (ref 1.7–7.7)
Neutrophils Relative %: 73 % (ref 43–77)

## 2013-12-31 LAB — I-STAT CHEM 8, ED
BUN: 17 mg/dL (ref 6–23)
Calcium, Ion: 1.13 mmol/L (ref 1.13–1.30)
Chloride: 108 mEq/L (ref 96–112)
Creatinine, Ser: 1.4 mg/dL — ABNORMAL HIGH (ref 0.50–1.35)
Glucose, Bld: 176 mg/dL — ABNORMAL HIGH (ref 70–99)
HEMATOCRIT: 49 % (ref 39.0–52.0)
HEMOGLOBIN: 16.7 g/dL (ref 13.0–17.0)
POTASSIUM: 3.3 meq/L — AB (ref 3.7–5.3)
SODIUM: 140 meq/L (ref 137–147)
TCO2: 21 mmol/L (ref 0–100)

## 2013-12-31 LAB — COMPREHENSIVE METABOLIC PANEL
ALT: 12 U/L (ref 0–53)
ANION GAP: 17 — AB (ref 5–15)
AST: 16 U/L (ref 0–37)
Albumin: 3.9 g/dL (ref 3.5–5.2)
Alkaline Phosphatase: 68 U/L (ref 39–117)
BUN: 16 mg/dL (ref 6–23)
CALCIUM: 9.6 mg/dL (ref 8.4–10.5)
CO2: 21 meq/L (ref 19–32)
CREATININE: 1.29 mg/dL (ref 0.50–1.35)
Chloride: 104 mEq/L (ref 96–112)
GFR, EST AFRICAN AMERICAN: 62 mL/min — AB (ref >90)
GFR, EST NON AFRICAN AMERICAN: 53 mL/min — AB (ref >90)
Glucose, Bld: 176 mg/dL — ABNORMAL HIGH (ref 70–99)
Potassium: 3.5 mEq/L — ABNORMAL LOW (ref 3.7–5.3)
SODIUM: 142 meq/L (ref 137–147)
TOTAL PROTEIN: 7.3 g/dL (ref 6.0–8.3)
Total Bilirubin: 0.7 mg/dL (ref 0.3–1.2)

## 2013-12-31 LAB — ETHANOL: Alcohol, Ethyl (B): <11 mg/dL (ref 0–11)

## 2013-12-31 LAB — I-STAT TROPONIN, ED: Troponin i, poc: 0 ng/mL (ref 0.00–0.08)

## 2013-12-31 LAB — URINALYSIS, ROUTINE W REFLEX MICROSCOPIC
BILIRUBIN URINE: NEGATIVE
GLUCOSE, UA: NEGATIVE mg/dL
HGB URINE DIPSTICK: NEGATIVE
KETONES UR: NEGATIVE mg/dL
Nitrite: NEGATIVE
PH: 5.5 (ref 5.0–8.0)
Protein, ur: NEGATIVE mg/dL
SPECIFIC GRAVITY, URINE: 1.017 (ref 1.005–1.030)
Urobilinogen, UA: 1 mg/dL (ref 0.0–1.0)

## 2013-12-31 LAB — RAPID URINE DRUG SCREEN, HOSP PERFORMED
Amphetamines: NOT DETECTED
BENZODIAZEPINES: NOT DETECTED
Barbiturates: NOT DETECTED
COCAINE: NOT DETECTED
OPIATES: NOT DETECTED
Tetrahydrocannabinol: NOT DETECTED

## 2013-12-31 LAB — PROTIME-INR
INR: 1.09 (ref 0.00–1.49)
PROTHROMBIN TIME: 14.1 s (ref 11.6–15.2)

## 2013-12-31 LAB — MRSA PCR SCREENING: MRSA BY PCR: NEGATIVE

## 2013-12-31 LAB — CBG MONITORING, ED: Glucose-Capillary: 174 mg/dL — ABNORMAL HIGH (ref 70–99)

## 2013-12-31 LAB — URINE MICROSCOPIC-ADD ON

## 2013-12-31 LAB — APTT: aPTT: 32 seconds (ref 24–37)

## 2013-12-31 MED ORDER — ACETAMINOPHEN 325 MG PO TABS
650.0000 mg | ORAL_TABLET | ORAL | Status: DC | PRN
  Administered 2013-12-31 – 2014-01-04 (×4): 650 mg via ORAL
  Filled 2013-12-31 (×4): qty 2

## 2013-12-31 MED ORDER — IOHEXOL 350 MG/ML SOLN
50.0000 mL | Freq: Once | INTRAVENOUS | Status: AC | PRN
  Administered 2013-12-31: 50 mL via INTRAVENOUS

## 2013-12-31 MED ORDER — STROKE: EARLY STAGES OF RECOVERY BOOK
Freq: Once | Status: AC
  Administered 2013-12-31: 19:00:00
  Filled 2013-12-31: qty 1

## 2013-12-31 MED ORDER — SENNOSIDES-DOCUSATE SODIUM 8.6-50 MG PO TABS
1.0000 | ORAL_TABLET | Freq: Every evening | ORAL | Status: DC | PRN
  Filled 2013-12-31: qty 1

## 2013-12-31 MED ORDER — SODIUM CHLORIDE 0.9 % IV SOLN
INTRAVENOUS | Status: DC
  Administered 2013-12-31 – 2014-01-01 (×3): via INTRAVENOUS

## 2013-12-31 MED ORDER — ACETAMINOPHEN 650 MG RE SUPP: 650.0000 mg | RECTAL | Status: DC | PRN

## 2013-12-31 MED ORDER — LABETALOL HCL 5 MG/ML IV SOLN: 10.0000 mg | INTRAVENOUS | Status: DC | PRN

## 2013-12-31 MED ORDER — ALTEPLASE (STROKE) FULL DOSE INFUSION
76.0000 mg | Freq: Once | INTRAVENOUS | Status: AC
  Administered 2013-12-31: 76 mg via INTRAVENOUS
  Filled 2013-12-31: qty 76

## 2013-12-31 MED ORDER — PANTOPRAZOLE SODIUM 40 MG IV SOLR
40.0000 mg | Freq: Every day | INTRAVENOUS | Status: DC
  Administered 2013-12-31: 40 mg via INTRAVENOUS
  Filled 2013-12-31 (×2): qty 40

## 2013-12-31 NOTE — ED Notes (Addendum)
Pt bright to ED by son with c/o altered mental status, slurred speech, and right sided weakness, onset around 1500. Pt reports that " I was around 2:30pm. Calvin GarnerWen pt tried to get into his truck, he was not able to grip with his right arm, and so he fell off the truck, landing on concrete and hit his head. Nod noted right forehead.

## 2013-12-31 NOTE — ED Provider Notes (Signed)
CSN: [id number]     Arrival date & time 12/31/13  1636 History   First MD Initiated Contact with Patient 12/31/13 1640     No chief complaint on file.    (Consider location/radiation/quality/duration/timing/severity/associated sxs/prior Treatment) HPI Comments: Patient here with sudden onset of right upper extremity weakness which caused him to fall. Symptoms started at 3 PM today which is approximately 2 hours prior to arrival. States he had trouble with prescriptions on his right. Also has a right lower extremity weakness as well 2. He called his son who noted that the patient's speech seemed to be sluggish but was not garbled. Patient did strike his right forehead but did not have any loss of consciousness. Is having trouble and leg since then but does use a cane normally do to prior left hip injury. No reported altered mental status. No recent history of illness. No prior history of CVA.  The history is provided by the patient and a relative.    Past Medical History  Diagnosis Date  . Hyperlipidemia   . Hypertension   . Spastic paraparesis   . 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 Acetabular fracture     left  . Diverticulosis    Past Surgical History  Procedure Laterality Date  . Nephrectomy  11/05  . Hip surgery  05/11   Family History  Problem Relation Age of Onset  . Stomach cancer Sister   . Breast cancer Sister   . Heart attack Father   . Hypertension Mother   . Aneurysm Mother    History  Substance Use Topics  . Smoking status: Never Smoker   . Smokeless tobacco: Never Used  . Alcohol Use: No    Review of Systems  All other systems reviewed and are negative.     Allergies  Review of patient's allergies indicates no known allergies.  Home Medications   Prior to Admission medications   Medication Sig Start Date End Date Taking? Authorizing Provider  amLODipine (NORVASC) 10 MG tablet TAKE 1 TABLET (10 MG TOTAL) BY MOUTH DAILY. 12/02/12   Nyoka CowdenMichael B [name],  MD  amLODipine (NORVASC) 10 MG tablet TAKE 1 TABLET (10 MG TOTAL) BY MOUTH DAILY.    Nyoka CowdenMichael B [name], MD  aspirin 81 MG tablet Take 81 mg by mouth daily.    Historical Provider, MD  benazepril (LOTENSIN) 20 MG tablet TAKE 1 TABLET (20 MG TOTAL) BY MOUTH DAILY. 08/25/12   Nyoka CowdenMichael B [name], MD  benazepril (LOTENSIN) 20 MG tablet TAKE 1 TABLET (20 MG TOTAL) BY MOUTH DAILY.    Nyoka CowdenMichael B [name], MD  furosemide (LASIX) 20 MG tablet TAKE 1 TABLET (20 MG TOTAL) BY MOUTH DAILY. Takes QOD 12/02/12   Nyoka CowdenMichael B [name], MD  furosemide (LASIX) 20 MG tablet TAKE 1 TABLET (20 MG TOTAL) BY MOUTH DAILY.    Nyoka CowdenMichael B [name], MD  Multiple Vitamin (MULTIVITAMIN) capsule Take 1 capsule by mouth daily.    Historical Provider, MD   There were no vitals taken for this visit. Physical Exam  Nursing note and vitals reviewed. Constitutional: He is oriented to person, place, and time. He appears well-developed and well-nourished.  Non-toxic appearance. No distress.  HENT:  Head: Normocephalic and atraumatic.  Eyes: Conjunctivae, EOM and lids are normal. Pupils are equal, round, and reactive to light.  Neck: Normal range of motion. Neck supple. No tracheal deviation present. No mass present.  Cardiovascular: Normal rate, regular rhythm and normal heart sounds.  Exam reveals no gallop.  No murmur heard. Pulmonary/Chest: Effort normal and breath sounds normal. No stridor. No respiratory distress. He has no decreased breath sounds. He has no wheezes. He has no rhonchi. He has no rales.  Abdominal: Soft. Normal appearance and bowel sounds are normal. He exhibits no distension. There is no tenderness. There is no rebound and no CVA tenderness.  Musculoskeletal: Normal range of motion. He exhibits no edema and no tenderness.  Neurological: He is alert and oriented to person, place, and time. He displays no tremor. A sensory deficit is present. No cranial nerve deficit. GCS eye subscore is 4. GCS verbal subscore is 5. GCS motor subscore is  6.  Patient has a right upper extremity arm drift with dysmetria. Strength on his right lower extremity is 4/5. Right upper extremity is 3 of 5. No facial droop noted. Speech is not dysarthric  Skin: Skin is warm and dry. No abrasion and no rash noted.  Psychiatric: He has a normal mood and affect. His speech is normal and behavior is normal.    ED Course  Procedures (including critical care time) Labs Review Labs Reviewed  ETHANOL  PROTIME-INR  APTT  CBC  DIFFERENTIAL  COMPREHENSIVE METABOLIC PANEL  URINE RAPID DRUG SCREEN (HOSP PERFORMED)  URINALYSIS, ROUTINE W REFLEX MICROSCOPIC  I-STAT CHEM 8, ED  I-STAT TROPOININ, ED  I-STAT TROPOININ, ED    Imaging Review No results found.   EKG Interpretation None      MDM   Final diagnoses:  None    5:06 PM   Code stroke initiated after my assessment. Awaiting for neurology to contact me   Date: 12/31/2013  Rate: 97  Rhythm: normal sinus rhythm  QRS Axis: normal  Intervals: normal  ST/T Wave abnormalities: nonspecific ST changes  Conduction Disutrbances:none  Narrative Interpretation:   Old EKG Reviewed: none available   5:19 PM Patient seen by neurology and patient is a TPA candidate. Care turned over to them at this time    Toy Baker, MD 12/31/13 1719

## 2013-12-31 NOTE — Code Documentation (Signed)
73 year old male [facility] with stroke like sx.  Family reports they were called by patient who states he got into his truck started having difficulty with right hand could not grip steering wheel and could not turn the key.  He then opened door and fell to ground.  Patient does not recall what time he walked to his truck - his son and daughter are present - looking at his cell phone he spoke with his sister at [phone] - we called her and she stated all seemed fine at that time.  The patient tells us he walked to the truck with no problems - using his cane with one hand and holding the ramp rail with his other - this is his normal - then he had no trouble using his right hand to open the truck door.  Once inside he attempted to write down a phone number and was having difficulty holding the pen -he then realized he could not grip the steering wheel or turn the key with his right hand.  He then fell out of the truck. He denies LOC.  He then tried to call his son at [phone] according the the cell phone - which he states was about 30 minutes after he went to the truck.  LSW determined to be 1430.  His NIHSS was 8.  He was administered tPA.  Then back to CT scan for CTA.  Post angio he developed some oral bleeding - underneath his tongue and his lower gum area.  Airway is clear - speech unchanged. Patient endorses very light headache described as "between the eyes"  Patient does have small hematoma right forehead from fall - no bleeding noted there - unchanged since admission. tPA paused and Dr. Cyril Mourningamillo to bedside.  No angioedema noted - tPA stopped per order Dr. Cyril Mourningamillo.  Patient received 56 cc of the total 76 cc dose.  HOB up to 30 degrees.  Tol well.  VSS - she doc flowsheets.  Tanit.SneddonKuch RN called report to Annia FriendlySusanna RN on 82M - handoff to BellaireKuch.  Patient without changes.

## 2013-12-31 NOTE — Consult Note (Signed)
Referring Physician: Dr. Zenia Resides    Chief Complaint: code stroke, right hemiparesis, dysarthria, right face weakness, fall.  HPI:                                                                                                                                         Calvin Merritt is an 73 y.o. male, right handed, with a past medical history significant for HTN, hyperlipidemia, left hip surgery few years ago, brought in via EMS as a code stroke due to acute onset of the above stated symptoms. He lives by himself, and said that today when he got into his truck he couldn't use the key to turn the truck on and was unable to grip the steering wheel. He probably stayed inside the truck for half an hour trying to maneuver the truck, then fell out of the truck and hit his head when he tried to get out the truck. He called his son who found him with slurred speech and having difficulty using the right side and walking. EMS was summoned and patient brought to the ED. Initial NIHSS 9. CT brain with " focal area of decreased attenuation right parietal region". The left MCA seems to be denser than the right. Patient takes aspirin daily. Date last known well: 12/31/13 Time last known well: 230 pm tPA Given: yes NIHSS: 9 MRS:   Past Medical History  Diagnosis Date  . Hyperlipidemia   . Hypertension   . Spastic paraparesis   . 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 Acetabular fracture     left  . Diverticulosis     Past Surgical History  Procedure Laterality Date  . Nephrectomy  11/05  . Hip surgery  05/11    Family History  Problem Relation Age of Onset  . Stomach cancer Sister   . Breast cancer Sister   . Heart attack Father   . Hypertension Mother   . Aneurysm Mother    Social History:  reports that he has never smoked. He has never used smokeless tobacco. He reports that he does not drink alcohol or use illicit drugs.  Allergies: No Known Allergies  Medications:                                                                                                                            I have  reviewed the patient's current medications.  ROS:                                                                                                                                       History obtained from the patient, family, and chart review.  General ROS: negative for - chills, fatigue, fever, night sweats, weight gain or weight loss Psychological ROS: negative for - behavioral disorder, hallucinations, memory difficulties, mood swings or suicidal ideation Ophthalmic ROS: negative for - blurry vision, double vision, eye pain or loss of vision ENT ROS: negative for - epistaxis, nasal discharge, oral lesions, sore throat, tinnitus or vertigo Allergy and Immunology ROS: negative for - hives or itchy/watery eyes Hematological and Lymphatic ROS: negative for - bleeding problems, bruising or swollen lymph nodes Endocrine ROS: negative for - galactorrhea, hair pattern changes, polydipsia/polyuria or temperature intolerance Respiratory ROS: negative for - cough, hemoptysis, shortness of breath or wheezing Cardiovascular ROS: negative for - chest pain, dyspnea on exertion, edema or irregular heartbeat Gastrointestinal ROS: negative for - abdominal pain, diarrhea, hematemesis, nausea/vomiting or stool incontinence Genito-Urinary ROS: negative for - dysuria, hematuria, incontinence or urinary frequency/urgency Musculoskeletal ROS: negative for - joint swelling or muscular weakness Neurological ROS: as noted in HPI Dermatological ROS: negative for rash and skin lesion changes  Physical exam: pleasant male in no apparent distress. Blood pressure 182/92, pulse 89, resp. rate 16, SpO2 98.00%. Head: normocephalic. Neck: supple, no bruits, no JVD. Cardiac: no murmurs. Lungs: clear. Abdomen: soft, no tender, no mass. Extremities: no edema. Neurologic Examination:                                                                                                       General: Mental Status: Alert, oriented, thought content appropriate. Dysarthria.  Able to follow 3 step commands without difficulty. Cranial Nerves: II: Discs flat bilaterally; Visual fields grossly normal, pupils equal, round, reactive to light and accommodation III,IV, VI: ptosis not present, extra-ocular motions intact bilaterally V,VII: smile asymmetric due to mild right face weakness, facial light touch sensation normal bilaterally VIII: hearing normal bilaterally IX,X: gag reflex present XI: bilateral shoulder shrug XII: midline tongue extension without atrophy or fasciculations Motor: Mild weakness right arm and leg Tone and bulk:normal tone throughout; no atrophy noted Sensory: Pinprick and light touch impaired in the right side. Deep Tendon Reflexes:  Right: Upper Extremity   Left: Upper extremity   biceps (C-5 to C-6) 2/4   biceps (C-5 to C-6) 2/4  tricep (C7) 2/4    triceps (C7) 2/4 Brachioradialis (C6) 2/4  Brachioradialis (C6) 2/4  Lower Extremity Lower Extremity  quadriceps (L-2 to L-4) 2/4   quadriceps (L-2 to L-4) 2/4 Achilles (S1) 2/4   Achilles (S1) 2/4  Plantars: Right: downgoing   Left: downgoing Cerebellar: Can not perform finger to nose and heel to shin in the right side Gait:  No tested.     Results for orders placed during the hospital encounter of 12/31/13 (from the past 48 hour(s))  CBG MONITORING, ED     Status: Abnormal   Collection Time    12/31/13  4:51 PM      Result Value Ref Range   Glucose-Capillary 174 (*) 70 - 99 mg/dL  ETHANOL     Status: None   Collection Time    12/31/13  4:55 PM      Result Value Ref Range   Alcohol, Ethyl (B) <11  0 - 11 mg/dL   Comment:            LOWEST DETECTABLE LIMIT FOR     SERUM ALCOHOL IS 11 mg/dL     FOR MEDICAL PURPOSES ONLY  PROTIME-INR     Status: None   Collection Time    12/31/13  4:55 PM      Result Value Ref Range   Prothrombin  Time 14.1  11.6 - 15.2 seconds   INR 1.09  0.00 - 1.49  APTT     Status: None   Collection Time    12/31/13  4:55 PM      Result Value Ref Range   aPTT 32  24 - 37 seconds  CBC     Status: Abnormal   Collection Time    12/31/13  4:55 PM      Result Value Ref Range   WBC 15.5 (*) 4.0 - 10.5 K/uL   RBC 5.25  4.22 - 5.81 MIL/uL   Hemoglobin 15.6  13.0 - 17.0 g/dL   HCT 45.2  39.0 - 52.0 %   MCV 86.1  78.0 - 100.0 fL   MCH 29.7  26.0 - 34.0 pg   MCHC 34.5  30.0 - 36.0 g/dL   RDW 13.3  11.5 - 15.5 %   Platelets 300  150 - 400 K/uL  DIFFERENTIAL     Status: Abnormal   Collection Time    12/31/13  4:55 PM      Result Value Ref Range   Neutrophils Relative % 73  43 - 77 %   Neutro Abs 11.2 (*) 1.7 - 7.7 K/uL   Lymphocytes Relative 20  12 - 46 %   Lymphs Abs 3.1  0.7 - 4.0 K/uL   Monocytes Relative 6  3 - 12 %   Monocytes Absolute 1.0  0.1 - 1.0 K/uL   Eosinophils Relative 1  0 - 5 %   Eosinophils Absolute 0.2  0.0 - 0.7 K/uL   Basophils Relative 0  0 - 1 %   Basophils Absolute 0.0  0.0 - 0.1 K/uL  COMPREHENSIVE METABOLIC PANEL     Status: Abnormal   Collection Time    12/31/13  4:55 PM      Result Value Ref Range   Sodium 142  137 - 147 mEq/L   Potassium 3.5 (*) 3.7 - 5.3 mEq/L   Chloride 104  96 - 112 mEq/L   CO2 21  19 - 32 mEq/L   Glucose, Bld 176 (*) 70 - 99 mg/dL  BUN 16  6 - 23 mg/dL   Creatinine, Ser 1.29  0.50 - 1.35 mg/dL   Calcium 9.6  8.4 - 10.5 mg/dL   Total Protein 7.3  6.0 - 8.3 g/dL   Albumin 3.9  3.5 - 5.2 g/dL   AST 16  0 - 37 U/L   ALT 12  0 - 53 U/L   Alkaline Phosphatase 68  39 - 117 U/L   Total Bilirubin 0.7  0.3 - 1.2 mg/dL   GFR calc non Af Amer 53 (*) >90 mL/min   GFR calc Af Amer 62 (*) >90 mL/min   Comment: (NOTE)     The eGFR has been calculated using the CKD EPI equation.     This calculation has not been validated in all clinical situations.     eGFR's persistently <90 mL/min signify possible Chronic Kidney     Disease.   Anion gap 17  (*) 5 - 15  URINE RAPID DRUG SCREEN (HOSP PERFORMED)     Status: None   Collection Time    12/31/13  5:13 PM      Result Value Ref Range   Opiates NONE DETECTED  NONE DETECTED   Cocaine NONE DETECTED  NONE DETECTED   Benzodiazepines NONE DETECTED  NONE DETECTED   Amphetamines NONE DETECTED  NONE DETECTED   Tetrahydrocannabinol NONE DETECTED  NONE DETECTED   Barbiturates NONE DETECTED  NONE DETECTED   Comment:            DRUG SCREEN FOR MEDICAL PURPOSES     ONLY.  IF CONFIRMATION IS NEEDED     FOR ANY PURPOSE, NOTIFY LAB     WITHIN 5 DAYS.                LOWEST DETECTABLE LIMITS     FOR URINE DRUG SCREEN     Drug Class       Cutoff (ng/mL)     Amphetamine      1000     Barbiturate      200     Benzodiazepine   161     Tricyclics       096     Opiates          300     Cocaine          300     THC              50  URINALYSIS, ROUTINE W REFLEX MICROSCOPIC     Status: Abnormal   Collection Time    12/31/13  5:13 PM      Result Value Ref Range   Color, Urine YELLOW  YELLOW   APPearance CLEAR  CLEAR   Specific Gravity, Urine 1.017  1.005 - 1.030   pH 5.5  5.0 - 8.0   Glucose, UA NEGATIVE  NEGATIVE mg/dL   Hgb urine dipstick NEGATIVE  NEGATIVE   Bilirubin Urine NEGATIVE  NEGATIVE   Ketones, ur NEGATIVE  NEGATIVE mg/dL   Protein, ur NEGATIVE  NEGATIVE mg/dL   Urobilinogen, UA 1.0  0.0 - 1.0 mg/dL   Nitrite NEGATIVE  NEGATIVE   Leukocytes, UA TRACE (*) NEGATIVE  URINE MICROSCOPIC-ADD ON     Status: Abnormal   Collection Time    12/31/13  5:13 PM      Result Value Ref Range   Squamous Epithelial / LPF RARE  RARE   WBC, UA 0-2  <3 WBC/hpf   Bacteria, UA RARE  RARE  Casts HYALINE CASTS (*) NEGATIVE  I-STAT TROPOININ, ED     Status: None   Collection Time    12/31/13  5:15 PM      Result Value Ref Range   Troponin i, poc 0.00  0.00 - 0.08 ng/mL   Comment 3            Comment: Due to the release kinetics of cTnI,     a negative result within the first hours     of the  onset of symptoms does not rule out     myocardial infarction with certainty.     If myocardial infarction is still suspected,     repeat the test at appropriate intervals.  I-STAT CHEM 8, ED     Status: Abnormal   Collection Time    12/31/13  5:17 PM      Result Value Ref Range   Sodium 140  137 - 147 mEq/L   Potassium 3.3 (*) 3.7 - 5.3 mEq/L   Chloride 108  96 - 112 mEq/L   BUN 17  6 - 23 mg/dL   Creatinine, Ser 1.40 (*) 0.50 - 1.35 mg/dL   Glucose, Bld 176 (*) 70 - 99 mg/dL   Calcium, Ion 1.13  1.13 - 1.30 mmol/L   TCO2 21  0 - 100 mmol/L   Hemoglobin 16.7  13.0 - 17.0 g/dL   HCT 49.0  39.0 - 52.0 %   Ct Head Wo Contrast  12/31/2013   CLINICAL DATA:  Code stroke.  Syncopal episode.  EXAM: CT HEAD WITHOUT CONTRAST  TECHNIQUE: Contiguous axial images were obtained from the base of the skull through the vertex without intravenous contrast.  COMPARISON:  None.  FINDINGS: Prominent extra-axial spaces. The ventricles and sulci are unremarkable. No evidence for hemorrhage, mass effect or mass lesion. Periventricular white matter hypodensity compatible with chronic small vessel ischemic change. There is a more focal area of decreased attenuation within the right parietal white matter (image 23; series 2) which may represent an age indeterminate infarct. Calvarium is intact. Paranasal sinuses are unremarkable. Mastoid air cells are well aerated.  IMPRESSION: No intracranial hemorrhage.  Chronic small vessel ischemic change. Additionally within the right parietal white matter is a more focal area of decreased attenuation which may represent an age-indeterminate infarct.  Critical Value/emergent results were called by telephone at the time of interpretation on 12/31/2013 at 5:19 pm to Dr. Armida Sans, who verbally acknowledged these results.   Electronically Signed   By: Lovey Newcomer M.D.   On: 12/31/2013 17:24   Assessment: 72 y.o. male with a left brain syndrome most likely consistent with an acute ischemic  cerebrovascular insult. NIHSS 9. Left MCA seems to be denser than the right. He is within the window for IV tpa and will treat accordingly. CTA brain searching for a proximal left MCA clot/thrombus. I must stressed the fac that there was a delay starting IV thrombolysis due to some uncertainties regarding exact time of onset. After extensive conversation with different family members we agreed that LSW was 2:30 pm. Admit to NICU and follow post IV tpa protocol. Stroke team will resume care in am.   Stroke Risk Factors - age,  HTN, hyperlipidemia  Plan: 1. HgbA1c, fasting lipid panel 2. MRI, MRA  of the brain without contrast 3. Echocardiogram 4. Carotid dopplers 5. Prophylactic therapy-as [er protocol 6. Risk factor modification 7. Telemetry monitoring 8. Frequent neuro checks 9. PT/OT SLP  Dorian Pod ,MD [facility] [phone]  12/31/2013, 6:11 PM

## 2014-01-01 ENCOUNTER — Inpatient Hospital Stay (HOSPITAL_COMMUNITY): Payer: Medicare Other

## 2014-01-01 LAB — LIPID PANEL
Cholesterol: 173 mg/dL (ref 0–200)
HDL: 33 mg/dL — ABNORMAL LOW (ref >39)
LDL Cholesterol: 107 mg/dL — ABNORMAL HIGH (ref 0–99)
TRIGLYCERIDES: 163 mg/dL — AB (ref <150)
Total CHOL/HDL Ratio: 5.2 RATIO
VLDL: 33 mg/dL (ref 0–40)

## 2014-01-01 LAB — HEMOGLOBIN A1C
HEMOGLOBIN A1C: 6.4 % — AB (ref <5.7)
Mean Plasma Glucose: 137 mg/dL — ABNORMAL HIGH (ref <117)

## 2014-01-01 MED ORDER — ATORVASTATIN CALCIUM 10 MG PO TABS
10.0000 mg | ORAL_TABLET | Freq: Every day | ORAL | Status: DC
  Administered 2014-01-01 – 2014-01-05 (×5): 10 mg via ORAL
  Filled 2014-01-01 (×5): qty 1

## 2014-01-01 MED ORDER — POTASSIUM CHLORIDE CRYS ER 20 MEQ PO TBCR
20.0000 meq | EXTENDED_RELEASE_TABLET | Freq: Two times a day (BID) | ORAL | Status: AC
  Administered 2014-01-01 (×2): 20 meq via ORAL
  Filled 2014-01-01 (×2): qty 1

## 2014-01-01 MED ORDER — PANTOPRAZOLE SODIUM 40 MG PO TBEC
40.0000 mg | DELAYED_RELEASE_TABLET | Freq: Every day | ORAL | Status: DC
  Administered 2014-01-01 – 2014-01-05 (×4): 40 mg via ORAL
  Filled 2014-01-01 (×5): qty 1

## 2014-01-01 NOTE — Evaluation (Signed)
Physical Therapy Evaluation Patient Details Name: Calvin Merritt MRN: [id number] DOB: 1940-08-20 Today's Date: 01/01/2014   History of Present Illness  Pt adm with rt sided weakness. CT - with focal area of decreased attenuation right parietal region. MRI pending.  Clinical Impression  Pt admitted with above. Pt currently with functional limitations due to the deficits listed below (see PT Problem List).  Pt will benefit from skilled PT to increase their independence and safety with mobility to allow discharge to the venue listed below. Pt's mobility more affected by pt's decr sensation, decr awareness of deficits and rt inattention than by weakness. Feel he needs CIR to address these deficits.      Follow Up Recommendations CIR    Equipment Recommendations  Other (comment) (to be determined)    Recommendations for Other Services Rehab consult     Precautions / Restrictions Precautions Precautions: Fall      Mobility  Bed Mobility Overal bed mobility: Needs Assistance Bed Mobility: Supine to Sit     Supine to sit: Min assist     General bed mobility comments: Assist to untangle rt foot from bedrail as pt unaware of where his leg was. Verbal cues fro technique.  Transfers Overall transfer level: Needs assistance Equipment used: Rolling walker (2 wheeled) Transfers: Sit to/from UGI CorporationStand;Stand Pivot Transfers Sit to Stand: Min assist;Mod assist Stand pivot transfers: Mod assist       General transfer comment: Verbal/tactile cues for hand placement. Assist to bring hips up - more assist from low chair. Pt with difficulty moving rt leg when attempting to pivot to chair with walker.   Ambulation/Gait                Stairs            Wheelchair Mobility    Modified Rankin (Stroke Patients Only)       Balance Overall balance assessment: Needs assistance Sitting-balance support: Single extremity supported;Feet supported Sitting balance-Leahy Scale:  Poor Sitting balance - Comments: Pt required min guard to min A to sit EOB. If engaged in dynamic activity pt falling posteriorly required mod A to correct.   Standing balance support: Bilateral upper extremity supported Standing balance-Leahy Scale: Poor Standing balance comment: Required walker and min A for static standing.                             Pertinent Vitals/Pain Pain Assessment: No/denies pain    Home Living Family/patient expects to be discharged to:: Private residence Living Arrangements: Alone Available Help at Discharge: Family Type of Home: House Home Access: Ramped entrance     Home Layout: Two level;Laundry or work area in Pitney Bowesbasement Home Equipment: Environmental consultantWalker - 2 wheels Additional Comments: Unsure if family willing/able to provide assistance at dc.    Prior Function Level of Independence: Independent               Hand Dominance   Dominant Hand: Right    Extremity/Trunk Assessment   Upper Extremity Assessment: Defer to OT evaluation           Lower Extremity Assessment: RLE deficits/detail RLE Deficits / Details: Strength grossly 4/5       Communication   Communication: No difficulties  Cognition Arousal/Alertness: Awake/alert Behavior During Therapy: WFL for tasks assessed/performed Overall Cognitive Status: Impaired/Different from baseline Area of Impairment: Safety/judgement;Awareness;Problem solving         Safety/Judgement: Decreased awareness of safety;Decreased awareness of deficits  Awareness: Intellectual Problem Solving: Requires verbal cues;Requires tactile cues General Comments: Pt with rt side inattention and neglect. Pt believes he can go home this afternoon.    General Comments      Exercises        Assessment/Plan    PT Assessment Patient needs continued PT services  PT Diagnosis Difficulty walking;Hemiplegia dominant side   PT Problem List Decreased strength;Decreased balance;Decreased  mobility;Decreased coordination;Decreased cognition;Decreased knowledge of use of DME;Decreased safety awareness;Decreased knowledge of precautions;Impaired sensation  PT Treatment Interventions DME instruction;Gait training;Functional mobility training;Therapeutic activities;Therapeutic exercise;Balance training;Neuromuscular re-education;Patient/family education;Cognitive remediation   PT Goals (Current goals can be found in the Care Plan section) Acute Rehab PT Goals Patient Stated Goal: Go home PT Goal Formulation: With patient Time For Goal Achievement: 01/15/14 Potential to Achieve Goals: Good    Frequency Min 4X/week   Barriers to discharge Decreased caregiver support      Co-evaluation               End of Session Equipment Utilized During Treatment: Gait belt Activity Tolerance: Patient tolerated treatment well Patient left: in chair;with call bell/phone within reach;with family/visitor present;with chair alarm set Nurse Communication: Mobility status;Other (comment) (chair alarm)         Time: 1610-9604 PT Time Calculation (min): 35 min   Charges:   PT Evaluation $Initial PT Evaluation Tier I: 1 Procedure PT Treatments $Gait Training: 23-37 mins   PT G Codes:          Henna Derderian 2014-01-27, 2:13 PM  [facility] PT [phone]

## 2014-01-01 NOTE — Evaluation (Addendum)
Speech Language Pathology Evaluation Patient Details Name: Calvin Merritt MRN: [id number] DOB: 18-Jul-1940 Today's Date: 01/01/2014 Time: [id number] SLP Time Calculation (min): 27 min  Problem List:  Patient Active Problem List   Diagnosis Date Noted  . Stroke with cerebral ischemia 12/31/2013  . Health care maintenance 08/21/2013  . HIP FRACTURE, LEFT 11/14/2009  . CERUMEN IMPACTION, BILATERAL 07/27/2008  . 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 OBSTRUCTIVE SLEEP APNEA 06/25/2007  . HYPERLIPIDEMIA 05/19/2007  . HYPERTENSION 05/19/2007   Past Medical History:  Past Medical History  Diagnosis Date  . Hyperlipidemia   . Hypertension   . Spastic paraparesis   . 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 Acetabular fracture     left  . Diverticulosis    Past Surgical History:  Past Surgical History  Procedure Laterality Date  . Nephrectomy  11/05  . Hip surgery  05/11   HPI:  Pt is a 73 y.o. male with PMH of HTN, hyperlipidemia, left hip surgery few years ago, brought in via EMS as a code stroke due to acute onset of right hemiparesis and dysarthria. Head CT 8/14 revealed a more focal area of decreased attenuation which may represent an age-indeterminate infarct. SLP eval ordered as part of stroke workup. Passed stroke swallow screen.   Assessment / Plan / Recommendation Clinical Impression  The pt demonstrated motor speech, language, and cognition skills intact for tasks assessed (orientation, memory, problem solving, naming, direction following, word finding, conversational skills, reading). Spoke with RN who reported that pt had difficulties with word finding, specifically with the word "hammock." During eval pt did not demonstrate word finding difficulties, so these difficulties may be very mild in nature, or pt may have had limited exposure to that particular word. Pt reportedly had dysarthria upon admission but this has subsided. Given these findings, no further ST warranted  at this time. Speech will sign off; please re-consult if needs arise.    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)   Cognition  Overall Cognitive Status: Within Functional Limits for tasks assessed Arousal/Alertness: Awake/alert Orientation Level: Oriented X4 Attention: Selective Selective Attention: Appears intact Memory: Appears intact Awareness: Appears intact Problem Solving: Appears intact Executive Function: Sequencing;Organizing Sequencing: Appears intact Organizing: Appears intact Safety/Judgment: Appears intact    Comprehension  Auditory Comprehension Overall Auditory Comprehension: Appears within functional limits for tasks assessed Yes/No Questions: Within Functional Limits Commands: Within Functional Limits Conversation: Complex Reading Comprehension Reading Status: Within funtional limits    Expression Expression Primary Mode of Expression: Verbal Verbal Expression Overall Verbal Expression: Appears within functional limits for tasks assessed Initiation: No impairment Level of Generative/Spontaneous Verbalization: Conversation Repetition: No impairment Naming: No impairment Pragmatics: No impairment Non-Verbal Means of Communication: Not applicable   Oral / Motor Oral Motor/Sensory Function Overall Oral Motor/Sensory Function: Appears within functional limits for tasks assessed Motor Speech Overall Motor Speech: Appears within functional limits for tasks assessed   GO     Metro KungOleksiak, Shuntay Everetts K, MA, CCC-SLP 01/01/2014, 12:22 PM

## 2014-01-01 NOTE — Progress Notes (Signed)
Nutrition Brief Note  Patient identified on the Malnutrition Screening Tool (MST) Report  Wt Readings from Last 15 Encounters:  12/31/13 184 lb 4.9 oz (83.6 kg)  08/19/13 185 lb (83.915 kg)  12/22/12 189 lb (85.73 kg)  06/22/12 207 lb 6.4 oz (94.076 kg)  01/14/12 209 lb (94.802 kg)  10/25/10 217 lb (98.431 kg)  12/22/08 207 lb 6.1 oz (94.068 kg)  07/27/08 212 lb (96.163 kg)  07/19/08 207 lb 2.1 oz (93.954 kg)  06/10/08 209 lb (94.802 kg)  03/17/08 206 lb 6.1 oz (93.614 kg)  06/25/07 207 lb (93.895 kg)  05/20/07 209 lb (94.802 kg)    Body mass index is 24.32 kg/(m^2). Patient meets criteria for normal body weight based on current BMI.   Current diet order is heart healthy, patient is consuming approximately 100% of meals at this time. Labs and medications reviewed.   No nutrition interventions warranted at this time. If nutrition issues arise, please consult RD.   Ebbie LatusHaley Hawkins RD, LDN

## 2014-01-01 NOTE — Progress Notes (Signed)
Stroke Team Progress Note  HISTORY Calvin Merritt is a 73 y.o. male, right handed, with a past medical history significant for HTN, hyperlipidemia, left hip surgery few years ago, brought in via EMS as a code stroke due to acute onset of right hemiparesis and dysarthria. He lives by himself, and said that today when he got into his truck he couldn't use the key to turn the truck on and was unable to grip the steering wheel. He probably stayed inside the truck for half an hour trying to maneuver the truck, then fell out of the truck and hit his head when he tried to get out the truck. He called his son who found him with slurred speech and having difficulty using the right side and walking.  EMS was summoned and patient brought to the ED.   Initial NIHSS 9.  CT brain with " focal area of decreased attenuation right parietal region". The left MCA seems to be denser than the right.  Patient takes aspirin daily.   Date last known well: 12/31/13  Time last known well: 230 pm  tPA Given: yes  NIHSS: 9  MRS:     SUBJECTIVE No family present. No complaint except wants to go home.  OBJECTIVE Most recent Vital Signs: Filed Vitals:   01/01/14 0600 01/01/14 0615 01/01/14 0630 01/01/14 0700  BP: 127/66  124/66 113/39  Pulse: 50 52 47 50  Temp:      TempSrc:      Resp: 15 18 14 14   Height:      Weight:      SpO2: 93% 92% 95% 94%   CBG (last 3)   Recent Labs  12/31/13 1651  GLUCAP 174*    IV Fluid Intake:   . sodium chloride 75 mL/hr at 12/31/13 2011    MEDICATIONS  . pantoprazole (PROTONIX) IV  40 mg Intravenous QHS   PRN:  acetaminophen, acetaminophen, labetalol, senna-docusate  Diet:  Cardiac thin liquids Activity:  Bedrest DVT Prophylaxis:  SCDs  CLINICALLY SIGNIFICANT STUDIES Basic Metabolic Panel:  Recent Labs Lab 12/31/13 1655 12/31/13 1717  NA 142 140  K 3.5* 3.3*  CL 104 108  CO2 21  --   GLUCOSE 176* 176*  BUN 16 17  CREATININE 1.29 1.40*  CALCIUM 9.6  --     Liver Function Tests:  Recent Labs Lab 12/31/13 1655  AST 16  ALT 12  ALKPHOS 68  BILITOT 0.7  PROT 7.3  ALBUMIN 3.9   CBC:  Recent Labs Lab 12/31/13 1655 12/31/13 1717  WBC 15.5*  --   NEUTROABS 11.2*  --   HGB 15.6 16.7  HCT 45.2 49.0  MCV 86.1  --   PLT 300  --    Coagulation:  Recent Labs Lab 12/31/13 1655  LABPROT 14.1  INR 1.09   Cardiac Enzymes: No results found for this basename: CKTOTAL, CKMB, CKMBINDEX, TROPONINI,  in the last 168 hours Urinalysis:  Recent Labs Lab 12/31/13 1713  COLORURINE YELLOW  LABSPEC 1.017  PHURINE 5.5  GLUCOSEU NEGATIVE  HGBUR NEGATIVE  BILIRUBINUR NEGATIVE  KETONESUR NEGATIVE  PROTEINUR NEGATIVE  UROBILINOGEN 1.0  NITRITE NEGATIVE  LEUKOCYTESUR TRACE*   Lipid Panel    Component Value Date/Time   CHOL 173 01/01/2014 0250   TRIG 163* 01/01/2014 0250   HDL 33* 01/01/2014 0250   CHOLHDL 5.2 01/01/2014 0250   VLDL 33 01/01/2014 0250   LDLCALC 107* 01/01/2014 0250   HgbA1C  No results found for this basename:  HGBA1C    Urine Drug Screen:     Component Value Date/Time   LABOPIA NONE DETECTED 12/31/2013 1713   COCAINSCRNUR NONE DETECTED 12/31/2013 1713   LABBENZ NONE DETECTED 12/31/2013 1713   AMPHETMU NONE DETECTED 12/31/2013 1713   THCU NONE DETECTED 12/31/2013 1713   LABBARB NONE DETECTED 12/31/2013 1713    Alcohol Level:  Recent Labs Lab 12/31/13 1655  ETH <11    Ct Angio Head W/cm &/or Wo Cm 12/31/2013    1. No major intracranial arterial occlusion or flow limiting proximal stenosis.  2. 4 mm slightly ill-defined hyperattenuating focus along the posterior margin of the proximal right PCA, of uncertain etiology. This may reflect a prominent venous structure. It does not clearly communicate with the adjacent PCA, although a partially thrombosed aneurysm cannot be completely excluded but is felt unlikely. Follow-up CTA could be performed in 6-12 months to assess stability.  3. Mild cervical carotid atherosclerosis  without stenosis.  4. 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     Ct Head Wo Contrast 12/31/2013    No intracranial hemorrhage.  Chronic small vessel ischemic change. Additionally within the right parietal white matter is a more focal area of decreased attenuation which may represent an age-indeterminate infarct.     MRI of the brain  - pending  MRA of the brain - pending   Carotid Doppler  See CTA of neck - cancel carotid doppler  2D Echocardiogram  Pending  CXR    EKG - SR rate 97 BPM - For complete results please see formal report.   Therapy Recommendations - pending  Physical Exam    General:  Mental Status:  Alert, oriented, thought content appropriate. Dysarthria. Able to follow 3 step commands without difficulty.  Cranial Nerves:  II: Discs not seen; Visual fields grossly normal, pupils equal, round, reactive to light and accommodation  III,IV, VI: ptosis OS chronic, extra-ocular motions intact bilaterally  V,VII: smile symmetric, facial light touch sensation normal bilaterally  VIII: hearing normal bilaterally  IX,X: gag reflex present  XI: bilateral shoulder shrug  XII: midline tongue extension without atrophy or fasciculations  Motor:  Mild weakness right arm and leg. RUE 4+/5 - grip 3/5 RLE 4/5 Tone and bulk:normal tone throughout; no atrophy noted  Sensory: Pinprick and light touch impaired in the right side.  Deep Tendon Reflexes:  Right: Upper Extremity Left: Upper extremity  biceps (C-5 to C-6) 2/4 biceps (C-5 to C-6) 2/4  tricep (C7) 2/4 triceps (C7) 2/4  Brachioradialis (C6) 2/4 Brachioradialis (C6) 2/4  Lower Extremity Lower Extremity  quadriceps (L-2 to L-4) 2/4 quadriceps (L-2 to L-4) 2/4  Achilles (S1) 2/4 Achilles (S1) 2/4  Plantars:  Right: downgoing Left: downgoing  Cerebellar:  Can not perform finger to nose and heel to shin in the right side   Gait:  Not tested.   ASSESSMENT Mr. CIRE CLUTE is a 73 y.o. male presenting with right hemiparesis and dysarthria.. Status post IV t-PA but stopped due to oral bleeding. Patient received 56 cc of the total 76 cc dose.  Head CT - No intracranial hemorrhage.  Within the right parietal white matter is a more focal area of decreased attenuation which may represent an age-indeterminate infarct. MRI / MRA pending.  On aspirin 81 mg orally every day prior to admission. Now on no antithrombotics secondary to TPA  for secondary stroke prevention. Patient with resultant  mild right heliparesis. Stroke work up underway.   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   4 mm slightly ill-defined hyperattenuating focus along the posterior margin of the proximal right PCA, of uncertain etiology. A partially thrombosed aneurysm cannot be completely excluded  Hypertension history  Hyperlipidemia history - Chol - 173; LDL - 107. No statin PTA. Start low dose lipitor.  Leukocytosis - follow  Hypokalemia - supplement  II/VI systolic murmer   Hospital day # 1  TREATMENT/PLAN  Add aspirin 325 mg orally every day for secondary stroke prevention if no bleeding on F/U study.  Await therapy evals  Lipitor added  Await MRI/ MRA, HgbA1C,  and echo.  CBC and Bmet in am.  Delton Seeavid Rinehuls PA-C [facility] Pager [phone] 01/01/2014, 8:15 AM   SIGNED  Appreciate PT evaluation, needs CIR This patient is ill and at significant risk of neurological worsening, constant monitoring of vital signs, hemodynamics,respiratory and cardiac monitoring,review of multiple databases, neurological assessment, discussion with family, other specialists and medical decision making of high complexity.I have made any additions or clarifications directly to the above note.  MRI/MRA and then transfer to  floor  Pauletta BrownsZEYLIKMAN, Taite Schoeppner   To contact Stroke Continuity provider, please refer to [email or web address]. After hours, contact General Neurology

## 2014-01-02 DIAGNOSIS — I359 Nonrheumatic aortic valve disorder, unspecified: Secondary | ICD-10-CM

## 2014-01-02 LAB — BASIC METABOLIC PANEL
Anion gap: 9 (ref 5–15)
BUN: 13 mg/dL (ref 6–23)
CALCIUM: 9 mg/dL (ref 8.4–10.5)
CO2: 24 mEq/L (ref 19–32)
Chloride: 108 mEq/L (ref 96–112)
Creatinine, Ser: 1.19 mg/dL (ref 0.50–1.35)
GFR calc Af Amer: 68 mL/min — ABNORMAL LOW (ref >90)
GFR, EST NON AFRICAN AMERICAN: 59 mL/min — AB (ref >90)
GLUCOSE: 114 mg/dL — AB (ref 70–99)
Potassium: 3.9 mEq/L (ref 3.7–5.3)
SODIUM: 141 meq/L (ref 137–147)

## 2014-01-02 LAB — CBC
HCT: 39.2 % (ref 39.0–52.0)
HEMOGLOBIN: 13.1 g/dL (ref 13.0–17.0)
MCH: 29.7 pg (ref 26.0–34.0)
MCHC: 33.4 g/dL (ref 30.0–36.0)
MCV: 88.9 fL (ref 78.0–100.0)
PLATELETS: 233 10*3/uL (ref 150–400)
RBC: 4.41 MIL/uL (ref 4.22–5.81)
RDW: 13.3 % (ref 11.5–15.5)
WBC: 10 10*3/uL (ref 4.0–10.5)

## 2014-01-02 MED ORDER — ASPIRIN EC 325 MG PO TBEC
325.0000 mg | DELAYED_RELEASE_TABLET | Freq: Every day | ORAL | Status: DC
  Administered 2014-01-02 – 2014-01-05 (×3): 325 mg via ORAL
  Filled 2014-01-02 (×4): qty 1

## 2014-01-02 MED ORDER — ENOXAPARIN SODIUM 40 MG/0.4ML ~~LOC~~ SOLN
40.0000 mg | SUBCUTANEOUS | Status: DC
  Administered 2014-01-02 – 2014-01-04 (×3): 40 mg via SUBCUTANEOUS
  Filled 2014-01-02 (×3): qty 0.4

## 2014-01-02 NOTE — Progress Notes (Signed)
Patient being ambulated to bathroom with one standby assist and walker.  Patient right leg gave out and patient lowered himself to the floor.  No immediate injuries noted, patient was able to get off the ground and walk to bed with assist of walker.  Staff remained at his side when returned to bed.  Vitals were obtained once returned to bed.  Onalee Huaavid, PA from stroke team was made aware of the situation.  Attempted to call and update son but was unable to reach at this time.  Will continue to monitor patient.

## 2014-01-02 NOTE — Progress Notes (Signed)
  Echocardiogram 2D Echocardiogram has been performed.  Arvil ChacoFoster, Nikeshia Keetch 01/02/2014, 3:56 PM

## 2014-01-02 NOTE — Progress Notes (Signed)
Stroke Team Progress Note  HISTORY Calvin Merritt is a 73 y.o. male, right handed, with a past medical history significant for HTN, hyperlipidemia, left hip surgery few years ago, brought in via EMS as a code stroke due to acute onset of right hemiparesis and dysarthria. He lives by himself, and said that today when he got into his truck he couldn't use the key to turn the truck on and was unable to grip the steering wheel. He probably stayed inside the truck for half an hour trying to maneuver the truck, then fell out of the truck and hit his head when he tried to get out the truck. He called his Merritt who found him with slurred speech and having difficulty using the right side and walking.  EMS was summoned and patient brought to the ED.   Initial NIHSS 9.  CT brain with " focal area of decreased attenuation right parietal region". The left MCA seems to be denser than the right.  Patient takes aspirin daily.   Date last known well: 12/31/13  Time last known well: 230 pm  tPA Given: yes  NIHSS: 9  MRS:     SUBJECTIVE Spoke with Calvin Merritt by phone. Calvin Merritt spoke with patient's Merritt by phone. Pt has been upset over pending divorce. Calvin Merritt believes pt. is depressed. Feels he may need psych consult. Discussed with patient. He does not want psych counseling for depression. Pt was lowered to floor today by nursing staff today after right leg gave out on the way to the bathroom. No obvious injury. Pt denies injury.  Calvin Merritt discussed need for TEE with patient who is agreeable.  OBJECTIVE Most recent Vital Signs: Filed Vitals:   01/02/14 0148 01/02/14 0617 01/02/14 1033 01/02/14 1148  BP: 146/67 139/74 148/54 144/55  Pulse: 53 53 47 59  Temp: 98.6 F (37 C) 98.4 F (36.9 C) 98.5 F (36.9 C) 98.1 F (36.7 C)  TempSrc: Oral Oral Oral Oral  Resp: 18 18 18 18   Height:      Weight:      SpO2: 95% 94% 97% 96%   CBG (last 3)   Recent Labs  12/31/13 1651  GLUCAP 174*    IV  Fluid Intake:   . sodium chloride 75 mL/hr at 01/01/14 2345    MEDICATIONS  . aspirin EC  325 mg Oral Daily  . atorvastatin  10 mg Oral q1800  . pantoprazole  40 mg Oral Daily   PRN:  acetaminophen, acetaminophen, labetalol, senna-docusate  Diet:  Cardiac thin liquids Activity:  Bedrest DVT Prophylaxis:  SCDs - lovenox added  CLINICALLY SIGNIFICANT STUDIES Basic Metabolic Panel:   Recent Labs Lab 12/31/13 1655 12/31/13 1717 01/02/14 0730  NA 142 140 141  K 3.5* 3.3* 3.9  CL 104 108 108  CO2 21  --  24  GLUCOSE 176* 176* 114*  BUN 16 17 13   CREATININE 1.29 1.40* 1.19  CALCIUM 9.6  --  9.0   Liver Function Tests:   Recent Labs Lab 12/31/13 1655  AST 16  ALT 12  ALKPHOS 68  BILITOT 0.7  PROT 7.3  ALBUMIN 3.9   CBC:   Recent Labs Lab 12/31/13 1655 12/31/13 1717 01/02/14 0730  WBC 15.5*  --  10.0  NEUTROABS 11.2*  --   --   HGB 15.6 16.7 13.1  HCT 45.2 49.0 39.2  MCV 86.1  --  88.9  PLT 300  --  233   Coagulation:  Recent Labs Lab 12/31/13 1655  LABPROT 14.1  INR 1.09   Cardiac Enzymes: No results found for this basename: CKTOTAL, CKMB, CKMBINDEX, TROPONINI,  in the last 168 hours Urinalysis:   Recent Labs Lab 12/31/13 1713  COLORURINE YELLOW  LABSPEC 1.017  PHURINE 5.5  GLUCOSEU NEGATIVE  HGBUR NEGATIVE  BILIRUBINUR NEGATIVE  KETONESUR NEGATIVE  PROTEINUR NEGATIVE  UROBILINOGEN 1.0  NITRITE NEGATIVE  LEUKOCYTESUR TRACE*   Lipid Panel    Component Value Date/Time   CHOL 173 01/01/2014 0250   TRIG 163* 01/01/2014 0250   HDL 33* 01/01/2014 0250   CHOLHDL 5.2 01/01/2014 0250   VLDL 33 01/01/2014 0250   LDLCALC 107* 01/01/2014 0250   HgbA1C  Lab Results  Component Value Date   HGBA1C 6.4* 01/01/2014    Urine Drug Screen:     Component Value Date/Time   LABOPIA NONE DETECTED 12/31/2013 1713   COCAINSCRNUR NONE DETECTED 12/31/2013 1713   LABBENZ NONE DETECTED 12/31/2013 1713   AMPHETMU NONE DETECTED 12/31/2013 1713   THCU NONE  DETECTED 12/31/2013 1713   LABBARB NONE DETECTED 12/31/2013 1713    Alcohol Level:   Recent Labs Lab 12/31/13 1655  ETH <11    Ct Angio Head W/cm &/or Wo Cm 12/31/2013    1. No major intracranial arterial occlusion or flow limiting proximal stenosis.  2. 4 mm slightly ill-defined hyperattenuating focus along the posterior margin of the proximal right PCA, of uncertain etiology. This may reflect a prominent venous structure. It does not clearly communicate with the adjacent PCA, although a partially thrombosed aneurysm cannot be completely excluded but is felt unlikely. Follow-up CTA could be performed in 6-12 months to assess stability.  3. Mild cervical carotid atherosclerosis without stenosis.  4. 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     Ct Head Wo Contrast 12/31/2013    No intracranial hemorrhage.  Chronic small vessel ischemic change. Additionally within the right parietal white matter is a more focal area of decreased attenuation which may represent an age-indeterminate infarct.     MRI / MRA of the brain 12/31/2013 1. 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  2. Small, acute right parietal infarcts.  3. No major intracranial arterial occlusion or proximal stenosis. Mild branch vessel irregularity.  4. 4 mm abnormality along the posterior aspect of the proximal right  PCA described on CTA remains indeterminate but may reflect a partially thrombosed aneurysm.   Carotid Doppler  See CTA of neck - cancel carotid doppler  2D Echocardiogram  Pending  CXR    EKG - SR rate 97 BPM - For complete results please see formal report.   Therapy Recommendations - pending  Physical Exam    General:  Mental Status:  Alert, oriented, thought content appropriate. Dysarthria. Able to follow 3 step commands without difficulty.   Cranial Nerves:  II: Discs not seen; Visual fields grossly normal, pupils equal, round, reactive to light and accommodation  III,IV, VI: ptosis OS chronic, extra-ocular motions intact bilaterally  V,VII: smile symmetric, facial light touch sensation normal bilaterally  VIII: hearing normal bilaterally  IX,X: gag reflex present  XI: bilateral shoulder shrug  XII: midline tongue extension without atrophy or fasciculations  Motor:  Mild weakness right arm and leg. RUE 4+/5 - grip 3/5 RLE 4/5 Tone and bulk:normal tone throughout; no atrophy noted  Sensory: Pinprick and light  touch impaired in the right side.  Deep Tendon Reflexes:  Right: Upper Extremity Left: Upper extremity  biceps (C-5 to C-6) 2/4 biceps (C-5 to C-6) 2/4  tricep (C7) 2/4 triceps (C7) 2/4  Brachioradialis (C6) 2/4 Brachioradialis (C6) 2/4  Lower Extremity Lower Extremity  quadriceps (L-2 to L-4) 2/4 quadriceps (L-2 to L-4) 2/4  Achilles (S1) 2/4 Achilles (S1) 2/4  Plantars:  Right: downgoing Left: downgoing  Cerebellar:  Can not perform finger to nose and heel to shin in the right side  Gait:  Not tested.   ASSESSMENT Mr. Calvin Merritt is a 73 y.o. male presenting with right hemiparesis and dysarthria.. Status post IV t-PA but stopped due to oral bleeding. Patient received 56 cc of the total 76 cc dose.  Head CT - No intracranial hemorrhage. 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  Infarcts felt to be embolic of unknown source On aspirin 81 mg orally every day prior to admission. Now on aspirin 325 mg daily for secondary stroke prevention. Patient with resultant mild right heliparesis. Stroke work up underway.   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   4 mm slightly ill-defined hyperattenuating focus along the posterior margin of the proximal right PCA,  of uncertain etiology. A partially thrombosed aneurysm cannot be completely excluded  Hypertension history  Hyperlipidemia history - Chol - 173; LDL - 107. No statin PTA. Lipitor 10 mg daily started .   Leukocytosis - resolved  Hypokalemia - 3.9 today  II/VI systolic murmer - await 2-D echo  Hemoglobin A1c 6.4  Depression  Plan TEE   Hospital day # 2  TREATMENT/PLAN  Add aspirin 325 mg orally every day for secondary stroke prevention if no bleeding on F/U study.  Await therapy evals  Lipitor added  Await echo  Lovenox added  Will contact cardiology and make patient n.p.o. after midnight for TEE tomorrow.  Delton Seeavid Rinehuls PA-C [facility] Pager [phone] 01/02/2014, 12:59 PM   SIGNED  Appreciate PT evaluation, needs CIR MRI likely cardio embolic strokes.  TEE in AM Pt appears depressed with curret divorce situation.  Asked if he wants to speak with Psychiatry by he refused. Called Calvin Merritt via phone and explained the case at Calvin request.    This patient is ill and at significant risk of neurological worsening, constant monitoring of vital signs, hemodynamics,respiratory and cardiac monitoring,review of multiple databases, neurological assessment, discussion with family, other specialists and medical decision making of high complexity.I have made any additions or clarifications directly to the above note.  Pauletta BrownsZEYLIKMAN, Heavenlee Maiorana     To contact Stroke Continuity provider, please refer to [email or web address]. After hours, contact General Neurology

## 2014-01-03 DIAGNOSIS — I6340 Cerebral infarction due to embolism of unspecified cerebral artery: Secondary | ICD-10-CM

## 2014-01-03 DIAGNOSIS — I6350 Cerebral infarction due to unspecified occlusion or stenosis of unspecified cerebral artery: Secondary | ICD-10-CM

## 2014-01-03 NOTE — Progress Notes (Signed)
Physical Therapy Treatment Patient Details Name: Calvin Merritt MRN: [id number] DOB: 14-Oct-1940 Today's Date: 01/03/2014    History of Present Illness Pt adm with rt sided weakness. CT - with focal area of decreased attenuation right parietal region. MRI pending.    PT Comments    Pt demo'd improved cognition and insight to deficits however con't to have R sided weakness and ataxia as well as sensation deficits. Cont' to recommend CIR upon d/c for maximal functional recovery.  Follow Up Recommendations  CIR     Equipment Recommendations       Recommendations for Other Services Rehab consult     Precautions / Restrictions Precautions Precautions: Fall Restrictions Weight Bearing Restrictions: No    Mobility  Bed Mobility               General bed mobility comments: pt received sitting EOB wtih RN staff  Transfers Overall transfer level: Needs assistance Equipment used: Rolling walker (2 wheeled) Transfers: Sit to/from Stand Sit to Stand: Mod assist         General transfer comment: verbal/tactile cues for hand placement and R LE management. assist to clear hips from bed, increased time  Ambulation/Gait Ambulation/Gait assistance: Mod assist Ambulation Distance (Feet): 50 Feet Assistive device: Rolling walker (2 wheeled) Gait Pattern/deviations: Step-to pattern;Decreased step length - right;Decreased stance time - left;Decreased weight shift to left;Shuffle;Ataxic;Trunk flexed;Narrow base of support Gait velocity: slow   General Gait Details: max directional v/c's to sequence R LE otherwise pt dragged his R foot. Pt with strong lean to the R requiring v/c's. v/c's to achieve terminal R knee extension   Stairs            Wheelchair Mobility    Modified Rankin (Stroke Patients Only) Modified Rankin (Stroke Patients Only) Pre-Morbid Rankin Score: No symptoms Modified Rankin: Moderately severe disability     Balance Overall balance assessment:  Needs assistance Sitting-balance support: Feet supported;Single extremity supported Sitting balance-Leahy Scale: Poor Sitting balance - Comments: posterior and L lateral lean   Standing balance support: Bilateral upper extremity supported Standing balance-Leahy Scale: Poor Standing balance comment: max directional v/c's to achieve midline posture and achieve full upright posture               High Level Balance Comments: worked on static standing balance with and without UE support    Cognition Arousal/Alertness: Awake/alert Behavior During Therapy: WFL for tasks assessed/performed Overall Cognitive Status: Impaired/Different from baseline Area of Impairment: Attention;Safety/judgement;Problem solving   Current Attention Level: Sustained     Safety/Judgement: Decreased awareness of safety;Decreased awareness of deficits   Problem Solving: Requires verbal cues;Requires tactile cues;Difficulty sequencing General Comments: Pt aware of R sided deficits however remains slightly impulsive. max directional v/c's with sequencing R LE    Exercises      General Comments        Pertinent Vitals/Pain Pain Assessment: No/denies pain    Home Living                      Prior Function            PT Goals (current goals can now be found in the care plan section) Acute Rehab PT Goals Patient Stated Goal: go home Progress towards PT goals: Progressing toward goals    Frequency  Min 4X/week    PT Plan Current plan remains appropriate    Co-evaluation             End of Session  Equipment Utilized During Treatment: Gait belt Activity Tolerance: Patient tolerated treatment well Patient left: in chair;with call bell/phone within reach;with chair alarm set     Time: 4098-1191 PT Time Calculation (min): 33 min  Charges:  $Gait Training: 8-22 mins $Neuromuscular Re-education: 8-22 mins                    G Codes:      Marcene Brawn 01/03/2014,  12:27 PM  Lewis Shock, PT, DPT Pager #: [phone] Office #: [phone]

## 2014-01-03 NOTE — Progress Notes (Signed)
CARE MANAGEMENT NOTE 01/03/2014  Patient:  Calvin Merritt,Calvin Merritt   Account Number:  [id number]  Date Initiated:  01/03/2014  Documentation initiated by:  Jiles CrockerHANDLER,Davida Falconi  Subjective/Objective Assessment:   ADMITTED WITH STROKE     Action/Plan:   CM FOLLOWING FOR DCP   Anticipated DC Date:  01/07/2014   Anticipated DC Plan:  IP REHAB FACILIT    DC Planning Services  CM consult         Status of service:  In process, will continue to follow Medicare Important Message given?   (If response is "NO", the following Medicare IM given date fields will be blank)  Per UR Regulation:  Reviewed for med. necessity/level of care/duration of stay  Comments:  8/17/2015Abelino Derrick- B Kore Madlock RN,BSN,MHA [phone]

## 2014-01-03 NOTE — Progress Notes (Signed)
Rehab Admissions Coordinator Note:  Patient was screened by Blanchie Zeleznik L for appropriateness for an Inpatient Acute Rehab Consult.  At this time, we are recommending Inpatient Rehab consult.  Gal Feldhaus, PT Rehabilitation Admissions Coordinator [phone]  

## 2014-01-03 NOTE — Consult Note (Signed)
Physical Medicine and Rehabilitation Consult  Reason for Consult: Right sided weakness and Slurred speech.  Referring Physician:  Dr. Roda Shutters.    HPI: Calvin Merritt is a 73 y.o. male with history of Hep C, spastic paraparesis, HTN, who was admitted on 12/31/13 with right sided weakness and slurred speech.  Patient reported that he had difficulty turning the key or gripping the wheel in his truck for about 30 minutes then fell out of his truck while trying to get out and struck his head. EMS activated and CT head in ED negative for hemorrhage. MRI brain with patchy areas of acute infarction in the left MCA territory, predominantly involving the parietal lobe and small acute right parietal infarct. MRA brain with  4 mm abnormality along the posterior aspect of the proximal right PCA described on CTA remains indeterminate with question of partially thrombosed aneurysm. 2D echo with EF 55-60%, moderate AVS, pseudonormal left ventricular filling pattern with grade 2 diastolic dysfunction. Carotid dopplers without significant ICA stenosis.   Neurology recommends ASA for embolic stroke of unknown source and TEE recommended for full workup.  Family reported that patient with stress as well as depressed mood due to pending divorce and recommended psych follow up but patient has  declined this. He continues to be limited by right sided weakness and mild dysarthria has resolved. MD, PT recommending CIR for follow up therapies.      Review of Systems  Constitutional: Negative for fever and chills.  HENT: Negative for ear pain, hearing loss and tinnitus.   Eyes: Negative for blurred vision and double vision.  Respiratory: Negative for cough.   Cardiovascular: Negative for chest pain and palpitations.  Gastrointestinal: Negative for nausea, vomiting and abdominal pain.  Musculoskeletal: Negative for myalgias.  Neurological: Positive for tremors and focal weakness. Negative for headaches.    Psychiatric/Behavioral: Negative for depression and suicidal ideas.     Past Medical History  Diagnosis Date  . Hyperlipidemia   . Hypertension   . Spastic paraparesis   . 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 Acetabular fracture     left  . Diverticulosis     Past Surgical History  Procedure Laterality Date  . Nephrectomy  11/05  . Hip surgery  05/11    Family History  Problem Relation Age of Onset  . Stomach cancer Sister   . Breast cancer Sister   . Heart attack Father   . Hypertension Mother   . Aneurysm Mother    Social History:    Lives alone.  reports that he has never smoked. He has never used smokeless tobacco. He reports that he does not drink alcohol or use illicit drugs.  Allergies: No Known Allergies  Medications Prior to Admission  Medication Sig Dispense Refill  . amLODipine (NORVASC) 10 MG tablet Take 10 mg by mouth daily.      Marland Kitchen aspirin EC 81 MG tablet Take 81 mg by mouth daily.      . benazepril (LOTENSIN) 20 MG tablet Take 20 mg by mouth daily.      . furosemide (LASIX) 20 MG tablet Take 20 mg by mouth daily.      . Multiple Vitamin (MULTIVITAMIN) tablet Take 1 tablet by mouth daily.        Home: Home Living Family/patient expects to be discharged to:: Private residence Living Arrangements: Alone Available Help at Discharge: Family Type of Home: House Home Access: Ramped entrance Home Layout: Two level;Laundry or work  area in basement Home Equipment: Walker - 2 wheels Additional Comments: Unsure if family willing/able to provide assistance at dc.  Lives With: Alone  Functional History: Prior Function Level of Independence: Independent Functional Status:  Mobility: Bed Mobility Overal bed mobility: Needs Assistance Bed Mobility: Supine to Sit Supine to sit: Min assist General bed mobility comments: Assist to untangle rt foot from bedrail as pt unaware of where his leg was. Verbal cues fro technique. Transfers Overall transfer level:  Needs assistance Equipment used: Rolling walker (2 wheeled) Transfers: Sit to/from [employer] Sit to Stand: Min assist;Mod assist Stand pivot transfers: Mod assist General transfer comment: Verbal/tactile cues for hand placement. Assist to bring hips up - more assist from low chair. Pt with difficulty moving rt leg when attempting to pivot to chair with walker.       ADL:    Cognition: Cognition Overall Cognitive Status: Impaired/Different from baseline Arousal/Alertness: Awake/alert Orientation Level: Oriented X4 Attention: Selective Selective Attention: Appears intact Memory: Appears intact Awareness: Appears intact Problem Solving: Appears intact Executive Function: Sequencing;Organizing Sequencing: Appears intact Organizing: Appears intact Safety/Judgment: Appears intact Cognition Arousal/Alertness: Awake/alert Behavior During Therapy: WFL for tasks assessed/performed Overall Cognitive Status: Impaired/Different from baseline Area of Impairment: Safety/judgement;Awareness;Problem solving Safety/Judgement: Decreased awareness of safety;Decreased awareness of deficits Awareness: Intellectual Problem Solving: Requires verbal cues;Requires tactile cues General Comments: Pt with rt side inattention and neglect. Pt believes he can go home this afternoon.  Blood pressure 155/67, pulse 52, temperature 98.2 F (36.8 C), temperature source Oral, resp. rate 20, height 6\' 1"  (1.854 m), weight 83.6 kg (184 lb 4.9 oz), SpO2 97.00%. Physical Exam  Constitutional: He appears well-developed and well-nourished.  HENT:  Head: Normocephalic and atraumatic.  Eyes: Conjunctivae are normal. Pupils are equal, round, and reactive to light.  Neck: No JVD present. No tracheal deviation present. No thyromegaly present.  Cardiovascular: Normal rate.   Respiratory: No respiratory distress.  GI: He exhibits no distension.  Musculoskeletal: Normal range of motion.  Neurological:    Alert, a little impulsive, fair insight and awareness. Right upper limb > Right lower limb ataxia with decreased fine motor coordination. +PD on right but re-focus and improve. No gross sensory differences right to left although has decreased position sense on the right. Strength 4- to 4/5 RUE prox to distal. LUE 4/5. Lower ext's 3+ HF and 4/5 Knees and ankle.   Psychiatric:  Pleasant but a little impulsive.     No results found for this or any previous visit (from the past 24 hour(s)). Mr Brain Wo Contrast  01/01/2014   CLINICAL DATA:  Right hemiparesis and dysarthria status post tPA.  EXAM: MRI HEAD WITHOUT CONTRAST  MRA HEAD WITHOUT CONTRAST  TECHNIQUE: Multiplanar, multiecho pulse sequences of the brain and surrounding structures were obtained without intravenous contrast. Angiographic images of the head were obtained using MRA technique without contrast.  COMPARISON:  Head CTA 12/31/2013.  FINDINGS: MRI HEAD FINDINGS  There are patchy areas of acute infarction involving the posterior left insula, left parietal operculum, and more superior aspect of the left parietal lobe. There are also a few subcentimeter foci of acute infarction in the right parietal lobe. There is no intracranial hemorrhage, mass, midline shift, or extra-axial fluid collection. There is very mild cytotoxic edema and areas of infarct without mass effect. There is moderate cerebral atrophy.  Distal right vertebral artery flow void appears small. 4 mm T2 hypointense focus is noted adjacent to the proximal right PCA corresponding to the abnormality  described on CTA. Orbits are unremarkable. Paranasal sinuses and mastoid air cells are clear.  MRA HEAD FINDINGS  Distal right vertebral artery is small. Distal left vertebral artery is patent and dominant. PICA origins appear patent. AICA and SCA origins appear patent. Basilar artery is patent without stenosis. There is no proximal PCA stenosis. There is mild bilateral PCA branch vessel  irregularity. Corresponding to the abnormality on CTA is a 4 mm abnormality along the posterior aspect of the proximal right PCA, with possible trace flow related enhancement extending towards this lesion from the posterior aspect of the right P1 segment, which could reflect a thrombosed aneurysm.  Internal carotid arteries are patent from skullbase to carotid termini. MCAs are unremarkable aside from mild branch vessel irregularity, without high-grade proximal stenosis or major vessel occlusion. ACAs are unremarkable.  IMPRESSION: 1. 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 2. Small, acute right parietal infarcts. 3. No major intracranial arterial occlusion or proximal stenosis. Mild branch vessel irregularity. 4. 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   Electronically Signed   By: Sebastian AcheAllen  Grady   On: 01/01/2014 17:46   Mr Maxine GlennMra Head/brain Wo Cm  01/01/2014   CLINICAL DATA:  Right hemiparesis and dysarthria status post tPA.  EXAM: MRI HEAD WITHOUT CONTRAST  MRA HEAD WITHOUT CONTRAST  TECHNIQUE: Multiplanar, multiecho pulse sequences of the brain and surrounding structures were obtained without intravenous contrast. Angiographic images of the head were obtained using MRA technique without contrast.  COMPARISON:  Head CTA 12/31/2013.  FINDINGS: MRI HEAD FINDINGS  There are patchy areas of acute infarction involving the posterior left insula, left parietal operculum, and more superior aspect of the left parietal lobe. There are also a few subcentimeter foci of acute infarction in the right parietal lobe. There is no intracranial hemorrhage, mass, midline shift, or extra-axial fluid collection. There is very mild cytotoxic edema and areas of infarct without mass effect. There is moderate cerebral atrophy.  Distal right vertebral artery flow  void appears small. 4 mm T2 hypointense focus is noted adjacent to the proximal right PCA corresponding to the abnormality described on CTA. Orbits are unremarkable. Paranasal sinuses and mastoid air cells are clear.  MRA HEAD FINDINGS  Distal right vertebral artery is small. Distal left vertebral artery is patent and dominant. PICA origins appear patent. AICA and SCA origins appear patent. Basilar artery is patent without stenosis. There is no proximal PCA stenosis. There is mild bilateral PCA branch vessel irregularity. Corresponding to the abnormality on CTA is a 4 mm abnormality along the posterior aspect of the proximal right PCA, with possible trace flow related enhancement extending towards this lesion from the posterior aspect of the right P1 segment, which could reflect a thrombosed aneurysm.  Internal carotid arteries are patent from skullbase to carotid termini. MCAs are unremarkable aside from mild branch vessel irregularity, without high-grade proximal stenosis or major vessel occlusion. ACAs are unremarkable.  IMPRESSION: 1. 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 2. Small, acute right parietal infarcts. 3. No major intracranial arterial occlusion or proximal stenosis. Mild branch vessel irregularity. 4. 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   Electronically Signed   By: Sebastian AcheAllen  Grady   On:  01/01/2014 17:46    Assessment/Plan: Diagnosis: left and right MCA infarcts 1. Does the need for close, 24 hr/day medical supervision in concert with the patient's rehab needs make it unreasonable for this patient to be served in a less intensive setting? Yes 2. Co-Morbidities requiring supervision/potential complications: htn, osa 3. Due to bladder management, bowel management, safety, skin/wound care, disease management,  medication administration, pain management and patient education, does the patient require 24 hr/day rehab nursing? Yes 4. Does the patient require coordinated care of a physician, rehab nurse, PT (1-2 hrs/day, 5 days/week) and OT (1-2 hrs/day, 5 days/week) to address physical and functional deficits in the context of the above medical diagnosis(es)? Yes Addressing deficits in the following areas: balance, endurance, locomotion, strength, transferring, bowel/bladder control, bathing, dressing, feeding, grooming, toileting and psychosocial support 5. Can the patient actively participate in an intensive therapy program of at least 3 hrs of therapy per day at least 5 days per week? Yes 6. The potential for patient to make measurable gains while on inpatient rehab is excellent 7. Anticipated functional outcomes upon discharge from inpatient rehab are modified independent  with PT, modified independent with OT, n/a with SLP. 8. Estimated rehab length of stay to reach the above functional goals is: 8-11 days 9. Does the patient have adequate social supports to accommodate these discharge functional goals? Yes 10. Anticipated D/C setting: Home 11. Anticipated post D/C treatments: HH therapy and Outpatient therapy 12. Overall Rehab/Functional Prognosis: excellent  RECOMMENDATIONS: This patient's condition is appropriate for continued rehabilitative care in the following setting: CIR Patient has agreed to participate in recommended program. Yes Note that insurance prior authorization may be required for reimbursement for recommended care.  Comment: Rehab Admissions Coordinator to follow up.  Thanks,  Ranelle Oyster, MD, [place] [facility]     01/03/2014

## 2014-01-03 NOTE — Evaluation (Signed)
Occupational Therapy Evaluation Patient Details Name: Calvin Merritt MRN: [id number] DOB: 1941-04-26 Today's Date: 01/03/2014    History of Present Illness Pt adm with rt sided weakness. CT - with focal area of decreased attenuation right parietal region.  MRI - Patchy areas of acute infarction in the left MCA territory.  Small, acute right parietal infarcts   Clinical Impression   Patient admitted with above. Patient independent PTA. Feel patient will benefit from acute OT to enhance overall performance in the areas of BADLs/IADLs, functional mobility, functional use of RUE. Patient overall min to mod assist for self-care tasks and functional mobility. Currently, patient requires up to +2 for functional ambulation secondary to increased ataxic movements and decreased overall awareness. Patient requires verbal and tactile cueing for safety during sit<>stands and dynamic standing tasks. Recommending CIR for extensive comprehensive rehab to get patient back to his PLOF. Patient willing and eager for CIR.      Follow Up Recommendations  CIR    Equipment Recommendations   (Defer to next venue)    Recommendations for Other Services Rehab consult     Precautions / Restrictions Precautions Precautions: Fall Restrictions Weight Bearing Restrictions: No      Mobility  Transfers Overall transfer level: Needs assistance Equipment used: Rolling walker (2 wheeled) Transfers: Sit to/from Stand Sit to Stand: Mod assist         General transfer comment: verbal/tactile cues for hand placement and R LE management. assist to clear hips from bed, increased time    Balance Overall balance assessment: Needs assistance Sitting-balance support: Feet supported;Single extremity supported Sitting balance-Leahy Scale: Poor Sitting balance - Comments: posterior and L lateral lean   Standing balance support: Bilateral upper extremity supported Standing balance-Leahy Scale: Poor Standing balance  comment: max directional v/c's to achieve midline posture and achieve full upright posture               High Level Balance Comments: worked on static standing balance with and without UE support            ADL Overall ADL's : Needs assistance/impaired Eating/Feeding: Moderate assistance;Cueing for safety;Cueing for sequencing (up to mod assist (hand over hand) due to ataxia in RUE)   Grooming: Moderate assistance;Cueing for safety;Cueing for sequencing (Up to mod assist (hand over hand) due to ataxic movements)   Upper Body Bathing: Cueing for safety;Cueing for sequencing;Minimal assitance   Lower Body Bathing: Moderate assistance;Cueing for safety;Cueing for sequencing   Upper Body Dressing : Minimal assistance;Cueing for safety;Cueing for sequencing   Lower Body Dressing: Moderate assistance;Cueing for safety;Cueing for sequencing   Toilet Transfer: Moderate assistance;Stand-pivot;RW;BSC             General ADL Comments: Patient requried mod assist for sit<>stand secodnary to therapist assisted with lift & lower. Patient also requires min verbal cues for safety & sequencing. Patient performed stand pivot transfer onto [facility] using RW. Patient with decreased  safety awareness, patient requires up to moderate assistance with ADL tasks at this time, he believed he was able to walk without assistance. +2 needed for functional ambulation for safety at this time.      Vision  No apparent visual impairments. 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                           Pertinent Vitals/Pain Pain Assessment: No/denies pain     Hand Dominance Right   Extremity/Trunk Assessment Upper  Extremity Assessment Upper Extremity Assessment: RUE deficits/detail RUE Deficits / Details: Patient with increased ataxic movements in RUE RUE Sensation: decreased proprioception;decreased light touch (patient reports numbness in pointer and middle finger) RUE  Coordination: decreased fine motor;decreased gross motor   Lower Extremity Assessment Lower Extremity Assessment: Defer to PT evaluation       Communication Communication Communication: No difficulties   Cognition Arousal/Alertness: Awake/alert Behavior During Therapy: WFL for tasks assessed/performed Overall Cognitive Status: Impaired/Different from baseline Area of Impairment: Attention;Memory;Safety/judgement;Awareness;Problem solving   Current Attention Level: Sustained Memory: Decreased short-term memory   Safety/Judgement: Decreased awareness of safety;Decreased awareness of deficits Awareness: Intellectual Problem Solving: Requires verbal cues;Requires tactile cues;Difficulty sequencing General Comments: Pt aware of R sided deficits however remains slightly impulsive. max directional v/c's with sequencing R LE              Home Living Family/patient expects to be discharged to:: Private residence Living Arrangements: Alone Available Help at Discharge: Family Type of Home: House Home Access: Ramped entrance     Home Layout: Two level;Laundry or work area in basement     Foot LockerBathroom Shower/Tub: Producer, television/film/videoWalk-in shower   Bathroom Toilet: Standard     Home Equipment: Environmental consultantWalker - 2 wheels   Additional Comments: Unsure if family willing/able to provide assistance at [employer].  Lives With: Alone    Prior Functioning/Environment Level of Independence: Independent             OT Diagnosis: Generalized weakness;Ataxia   OT Problem List: Decreased strength;Decreased activity tolerance;Impaired balance (sitting and/or standing);Decreased coordination;Decreased cognition;Decreased safety awareness;Decreased knowledge of use of DME or AE;Impaired sensation;Impaired UE functional use   OT Treatment/Interventions: Self-care/ADL training;Therapeutic exercise;Neuromuscular education;Energy conservation;DME and/or AE instruction;Therapeutic activities;Cognitive  remediation/compensation;Patient/family education;Balance training    OT Goals(Current goals can be found in the care plan section) Acute Rehab OT Goals Patient Stated Goal:  (none stated) OT Goal Formulation: With patient Time For Goal Achievement: 01/10/14 Potential to Achieve Goals: Good  OT Frequency: Min 2X/week   Barriers to D/C: Decreased caregiver support             End of Session Equipment Utilized During Treatment: Gait belt;Rolling walker Nurse Communication: Mobility status  Activity Tolerance: Patient tolerated treatment well Patient left: in chair;with call bell/phone within reach;with chair alarm set;with nursing/sitter in room   Time: 1215-1237 OT Time Calculation (min): 22 min Charges:  OT General Charges $OT Visit: 1 Procedure OT Evaluation $Initial OT Evaluation Tier I: 1 Procedure OT Treatments $Self Care/Home Management : 8-22 mins G-Codes:    Tamarcus Condie , MS, OTR/L, CLT Pager: [phone] 01/03/2014, 1:27 PM

## 2014-01-03 NOTE — Progress Notes (Signed)
*  PRELIMINARY RESULTS* Vascular Ultrasound Carotid Duplex (Doppler) has been completed.  Preliminary findings: Bilateral:  1-39% ICA stenosis.  Vertebral artery flow is antegrade.     Farrel DemarkJill Eunice, RDMS, RVT  01/03/2014, 9:11 AM

## 2014-01-03 NOTE — Progress Notes (Signed)
Stroke Team Progress Note  HISTORY Calvin Merritt is a 73 y.o. male, right handed, with a past medical history significant for HTN, hyperlipidemia, left hip surgery few years ago, brought in via EMS as a code stroke due to acute onset of right hemiparesis and dysarthria. He lives by himself, and said that today when he got into his truck he couldn't use the key to turn the truck on and was unable to grip the steering wheel. He probably stayed inside the truck for half an hour trying to maneuver the truck, then fell out of the truck and hit his head when he tried to get out the truck. He called his son who found him with slurred speech and having difficulty using the right side and walking.  EMS was summoned and patient brought to the ED.   Initial NIHSS 9.  CT brain with " focal area of decreased attenuation right parietal region". The left MCA seems to be denser than the right.  Patient takes aspirin daily.   Date last known well: 12/31/13  Time last known well: 230 pm  tPA Given: yes  NIHSS: 9  MRS:   SUBJECTIVE Sitting up in chair. "I can Friday. I think I had a stroke". Reports he could not grip at that time. Leg ok. Face ok. No speech problems.  OBJECTIVE Most recent Vital Signs: Filed Vitals:   01/02/14 2201 01/03/14 0135 01/03/14 0606 01/03/14 1019  BP: 166/74 137/75 155/67 149/66  Pulse: 63 51 52 51  Temp: 98.2 F (36.8 C) 97.9 F (36.6 C) 98.2 F (36.8 C) 98.4 F (36.9 C)  TempSrc: Oral Oral Oral Oral  Resp: 18 18 20 16   Height:      Weight:      SpO2: 95% 96% 97% 97%   CBG (last 3)   Recent Labs  12/31/13 1651  GLUCAP 174*    IV Fluid Intake:   . sodium chloride 75 mL/hr at 01/01/14 2345    MEDICATIONS  . aspirin EC  325 mg Oral Daily  . atorvastatin  10 mg Oral q1800  . enoxaparin (LOVENOX) injection  40 mg Subcutaneous Q24H  . pantoprazole  40 mg Oral Daily   PRN:  acetaminophen, acetaminophen, labetalol, senna-docusate  Diet:  Cardiac thin  liquids Activity:  Up with assistance DVT Prophylaxis:  Lovenox 40 mg sq daily   CLINICALLY SIGNIFICANT STUDIES Basic Metabolic Panel:   Recent Labs Lab 12/31/13 1655 12/31/13 1717 01/02/14 0730  NA 142 140 141  K 3.5* 3.3* 3.9  CL 104 108 108  CO2 21  --  24  GLUCOSE 176* 176* 114*  BUN 16 17 13   CREATININE 1.29 1.40* 1.19  CALCIUM 9.6  --  9.0   Liver Function Tests:   Recent Labs Lab 12/31/13 1655  AST 16  ALT 12  ALKPHOS 68  BILITOT 0.7  PROT 7.3  ALBUMIN 3.9   CBC:   Recent Labs Lab 12/31/13 1655 12/31/13 1717 01/02/14 0730  WBC 15.5*  --  10.0  NEUTROABS 11.2*  --   --   HGB 15.6 16.7 13.1  HCT 45.2 49.0 39.2  MCV 86.1  --  88.9  PLT 300  --  233   Coagulation:   Recent Labs Lab 12/31/13 1655  LABPROT 14.1  INR 1.09   Cardiac Enzymes: No results found for this basename: CKTOTAL, CKMB, CKMBINDEX, TROPONINI,  in the last 168 hours Urinalysis:   Recent Labs Lab 12/31/13 1713  COLORURINE YELLOW  LABSPEC 1.017  PHURINE 5.5  GLUCOSEU NEGATIVE  HGBUR NEGATIVE  BILIRUBINUR NEGATIVE  KETONESUR NEGATIVE  PROTEINUR NEGATIVE  UROBILINOGEN 1.0  NITRITE NEGATIVE  LEUKOCYTESUR TRACE*   Lipid Panel    Component Value Date/Time   CHOL 173 01/01/2014 0250   TRIG 163* 01/01/2014 0250   HDL 33* 01/01/2014 0250   CHOLHDL 5.2 01/01/2014 0250   VLDL 33 01/01/2014 0250   LDLCALC 107* 01/01/2014 0250   HgbA1C  Lab Results  Component Value Date   HGBA1C 6.4* 01/01/2014    Urine Drug Screen:     Component Value Date/Time   LABOPIA NONE DETECTED 12/31/2013 1713   COCAINSCRNUR NONE DETECTED 12/31/2013 1713   LABBENZ NONE DETECTED 12/31/2013 1713   AMPHETMU NONE DETECTED 12/31/2013 1713   THCU NONE DETECTED 12/31/2013 1713   LABBARB NONE DETECTED 12/31/2013 1713    Alcohol Level:   Recent Labs Lab 12/31/13 1655  ETH <11    Ct Angio Head W/cm &/or Wo Cm 12/31/2013    1. No major intracranial arterial occlusion or flow limiting proximal stenosis.   2. 4 mm slightly ill-defined hyperattenuating focus along the posterior margin of the proximal right PCA, of uncertain etiology. This may reflect a prominent venous structure. It does not clearly communicate with the adjacent PCA, although a partially thrombosed aneurysm cannot be completely excluded but is felt unlikely. Follow-up CTA could be performed in 6-12 months to assess stability.  3. Mild cervical carotid atherosclerosis without stenosis.  4. 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   Ct Head Wo Contrast 12/31/2013    No intracranial hemorrhage.  Chronic small vessel ischemic change. Additionally within the right parietal white matter is a more focal area of decreased attenuation which may represent an age-indeterminate infarct.    MRI / MRA of the brain 12/31/2013 1. 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  2. Small, acute right parietal infarcts.  3. No major intracranial arterial occlusion or proximal stenosis. Mild branch vessel irregularity.  4. 4 mm abnormality along the posterior aspect of the proximal right  PCA described on CTA remains indeterminate but may reflect a partially thrombosed aneurysm.  Carotid Doppler  No evidence of hemodynamically significant internal carotid artery stenosis. Vertebral artery flow is antegrade.   2D Echocardiogram EF 55-60% with no source of embolus.   EKG - SR rate 97 BPM - For complete results please see formal report.   Therapy Recommendations - CIR  Physical Exam    General:  Mental Status:  Alert, oriented, thought content appropriate. Dysarthria. Able to follow 3 step commands without difficulty.  Cranial Nerves:  II: Discs not seen; Visual fields grossly normal, pupils equal, round, reactive to light and accommodation  III,IV, VI: ptosis OS chronic, extra-ocular motions  intact bilaterally  V,VII: smile symmetric, facial light touch sensation normal bilaterally  VIII: hearing normal bilaterally  IX,X: gag reflex present  XI: bilateral shoulder shrug  XII: midline tongue extension without atrophy or fasciculations  Motor:  Mild weakness right arm and leg. RUE 4+/5 - grip 3/5 RLE 4/5 Tone and bulk:normal tone throughout; no atrophy noted  Sensory: Pinprick and light touch impaired in the right side.  Deep Tendon Reflexes:  Right: Upper Extremity Left: Upper extremity  biceps (C-5 to C-6) 2/4 biceps (C-5 to C-6) 2/4  tricep (C7) 2/4 triceps (C7) 2/4  Brachioradialis (C6)  2/4 Brachioradialis (C6) 2/4  Lower Extremity Lower Extremity  quadriceps (L-2 to L-4) 2/4 quadriceps (L-2 to L-4) 2/4  Achilles (S1) 2/4 Achilles (S1) 2/4  Plantars:  Right: downgoing Left: downgoing  Cerebellar:  Can not perform finger to nose and heel to shin in the right side  Gait:  Not tested.   ASSESSMENT Mr. BAYLON SANTELLI is a 73 y.o. male presenting with right hemiparesis and dysarthria.. Status post IV t-PA but stopped due to oral bleeding. Patient received 56 cc of the total 76 cc dose.  Head CT - No intracranial hemorrhage. 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  Infarcts felt to be embolic of unknown source. No hx racing heart, afib sx.  On aspirin 81 mg orally every day prior to admission. Now on aspirin 325 mg daily for secondary stroke prevention. Patient with resultant mild right heliparesis.   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   4 mm slightly ill-defined hyperattenuating focus along the posterior margin of the proximal right PCA, of uncertain etiology. A partially thrombosed aneurysm cannot be completely excluded  Hypertension history  Hyperlipidemia history - Chol -  173; LDL - 107. No statin PTA. Lipitor 10 mg daily started .   Leukocytosis - resolved  Hypokalemia - 3.9 today  Hemoglobin A1c 6.4  Depression -  appears depressed with current divorce situation.  Refused Psychiatry contact.  Hospital day # 3  TREATMENT/PLAN  Continue  aspirin 325 mg orally every day for secondary stroke prevention   Continue Lipitor  TEE to look for embolic source. Arranged with [facility] for tomorrow.  If positive for PFO (patent foramen ovale), check bilateral lower extremity venous dopplers to rule out DVT as possible source of stroke. (I have made patient NPO after midnight tonight).  Rehab consult OP f/u of parotid mass  Annie Main, MSN, RN, ANVP-BC, ANP-BC, GNP-BC [facility] Pager: [phone] 01/03/2014 1:06 PM  I, the attending vascular neurologist, have personally obtained a history, examined the patient, evaluated laboratory data, individually viewed imaging studies, and formulated the assessment and plan of care.  I have made any additions or clarifications directly to the above note and agree with the findings and plan as currently documented.   Marvel Plan, MD PhD Stroke Neurology 01/04/2014 12:23 AM   To contact Stroke Continuity provider, please refer to [email or web address]. After hours, contact General Neurology

## 2014-01-03 NOTE — Progress Notes (Signed)
    [facility] has been requested to perform a transesophageal echocardiogram on Mr. Calvin Merritt for stroke.  After careful review of history and examination, the risks and benefits of transesophageal echocardiogram have been explained including risks of esophageal damage, perforation (1:10,000 risk), bleeding, pharyngeal hematoma as well as other potential complications associated with conscious sedation including aspiration, arrhythmia, respiratory failure and death. Alternatives to treatment were discussed, questions were answered. Patient is willing to proceed. It is scheduled with Dr. Shirlee LatchMcLean for 8am tomorrow. Orders written.  Beauford Lando PA-C 01/03/2014 5:08 PM

## 2014-01-04 ENCOUNTER — Encounter (HOSPITAL_COMMUNITY)
Admission: EM | Disposition: A | Discharge status: Rehabilitation Facility | Payer: Self-pay | Source: Home / Self Care | Attending: Neurology

## 2014-01-04 ENCOUNTER — Encounter (HOSPITAL_COMMUNITY): Payer: Self-pay

## 2014-01-04 DIAGNOSIS — Q2111 Secundum atrial septal defect: Secondary | ICD-10-CM

## 2014-01-04 DIAGNOSIS — I6350 Cerebral infarction due to unspecified occlusion or stenosis of unspecified cerebral artery: Secondary | ICD-10-CM

## 2014-01-04 DIAGNOSIS — I6789 Other cerebrovascular disease: Secondary | ICD-10-CM

## 2014-01-04 DIAGNOSIS — Q211 Atrial septal defect: Secondary | ICD-10-CM

## 2014-01-04 HISTORY — PX: TEE WITHOUT CARDIOVERSION: SHX5443

## 2014-01-04 SURGERY — ECHOCARDIOGRAM, TRANSESOPHAGEAL
Anesthesia: Moderate Sedation

## 2014-01-04 MED ORDER — FENTANYL CITRATE 0.05 MG/ML IJ SOLN
INTRAMUSCULAR | Status: DC | PRN
  Administered 2014-01-04 (×2): 25 ug via INTRAVENOUS

## 2014-01-04 MED ORDER — BUTAMBEN-TETRACAINE-BENZOCAINE 2-2-14 % EX AERO
INHALATION_SPRAY | CUTANEOUS | Status: DC | PRN
  Administered 2014-01-04: 2 via TOPICAL

## 2014-01-04 MED ORDER — SODIUM CHLORIDE 0.9 % IV SOLN
INTRAVENOUS | Status: DC
  Administered 2014-01-04: 500 mL via INTRAVENOUS

## 2014-01-04 MED ORDER — FENTANYL CITRATE 0.05 MG/ML IJ SOLN
INTRAMUSCULAR | Status: AC
  Filled 2014-01-04: qty 2

## 2014-01-04 MED ORDER — MIDAZOLAM HCL 5 MG/ML IJ SOLN
INTRAMUSCULAR | Status: AC
  Filled 2014-01-04: qty 2

## 2014-01-04 MED ORDER — MIDAZOLAM HCL 10 MG/2ML IJ SOLN
INTRAMUSCULAR | Status: DC | PRN
  Administered 2014-01-04 (×2): 2 mg via INTRAVENOUS
  Administered 2014-01-04: 1 mg via INTRAVENOUS

## 2014-01-04 MED ORDER — DIPHENHYDRAMINE HCL 50 MG/ML IJ SOLN
INTRAMUSCULAR | Status: AC
  Filled 2014-01-04: qty 1

## 2014-01-04 NOTE — Interval H&P Note (Signed)
History and Physical Interval Note:  01/04/2014 8:39 AM  Calvin Merritt  has presented today for surgery, with the diagnosis of STROKE  The various methods of treatment have been discussed with the patient and family. After consideration of risks, benefits and other options for treatment, the patient has consented to  Procedure(s): TRANSESOPHAGEAL ECHOCARDIOGRAM (TEE) (N/A) as a surgical intervention .  The patient's history has been reviewed, patient examined, no change in status, stable for surgery.  I have reviewed the patient's chart and labs.  Questions were answered to the patient's satisfaction.     Lakesia Dahle Chesapeake EnergyMcLean

## 2014-01-04 NOTE — Progress Notes (Signed)
PT Cancellation Note  Patient Details Name: Calvin Merritt MRN: [id number] DOB: 1941/01/08   Cancelled Treatment:    Reason Eval/Treat Not Completed: Patient at procedure or test/unavailable.  Pt off the floor at endo. Attempted upon pt return however pt still drowsy from anestesia from procedure. PT to return as able.   Marcene BrawnChadwell, Jorge Amparo Marie 01/04/2014, 10:18 AM  Lewis ShockAshly Toriann Spadoni, PT, DPT Pager #: [phone] Office #: [phone]

## 2014-01-04 NOTE — H&P (View-Only) (Signed)
Stroke Team Progress Note  HISTORY Calvin Merritt is a 73 y.o. male, right handed, with a past medical history significant for HTN, hyperlipidemia, left hip surgery few years ago, brought in via EMS as a code stroke due to acute onset of right hemiparesis and dysarthria. He lives by himself, and said that today when he got into his truck he couldn't use the key to turn the truck on and was unable to grip the steering wheel. He probably stayed inside the truck for half an hour trying to maneuver the truck, then fell out of the truck and hit his head when he tried to get out the truck. He called his son who found him with slurred speech and having difficulty using the right side and walking.  EMS was summoned and patient brought to the ED.   Initial NIHSS 9.  CT brain with " focal area of decreased attenuation right parietal region". The left MCA seems to be denser than the right.  Patient takes aspirin daily.   Date last known well: 12/31/13  Time last known well: 230 pm  tPA Given: yes  NIHSS: 9  MRS:     SUBJECTIVE Spoke with pt's son by phone. Dr Loretha Brasil spoke with patient's daughter by phone. Pt has been upset over pending divorce. Pt's son believes pt. is depressed. Feels he may need psych consult. Discussed with patient. He does not want psych counseling for depression. Pt was lowered to floor today by nursing staff today after right leg gave out on the way to the bathroom. No obvious injury. Pt denies injury.  Dr Loretha Brasil discussed need for TEE with patient who is agreeable.  OBJECTIVE Most recent Vital Signs: Filed Vitals:   01/02/14 0148 01/02/14 0617 01/02/14 1033 01/02/14 1148  BP: 146/67 139/74 148/54 144/55  Pulse: 53 53 47 59  Temp: 98.6 F (37 C) 98.4 F (36.9 C) 98.5 F (36.9 C) 98.1 F (36.7 C)  TempSrc: Oral Oral Oral Oral  Resp: 18 18 18 18   Height:      Weight:      SpO2: 95% 94% 97% 96%   CBG (last 3)   Recent Labs  12/31/13 1651  GLUCAP 174*    IV  Fluid Intake:   . sodium chloride 75 mL/hr at 01/01/14 2345    MEDICATIONS  . aspirin EC  325 mg Oral Daily  . atorvastatin  10 mg Oral q1800  . pantoprazole  40 mg Oral Daily   PRN:  acetaminophen, acetaminophen, labetalol, senna-docusate  Diet:  Cardiac thin liquids Activity:  Bedrest DVT Prophylaxis:  SCDs - lovenox added  CLINICALLY SIGNIFICANT STUDIES Basic Metabolic Panel:   Recent Labs Lab 12/31/13 1655 12/31/13 1717 01/02/14 0730  NA 142 140 141  K 3.5* 3.3* 3.9  CL 104 108 108  CO2 21  --  24  GLUCOSE 176* 176* 114*  BUN 16 17 13   CREATININE 1.29 1.40* 1.19  CALCIUM 9.6  --  9.0   Liver Function Tests:   Recent Labs Lab 12/31/13 1655  AST 16  ALT 12  ALKPHOS 68  BILITOT 0.7  PROT 7.3  ALBUMIN 3.9   CBC:   Recent Labs Lab 12/31/13 1655 12/31/13 1717 01/02/14 0730  WBC 15.5*  --  10.0  NEUTROABS 11.2*  --   --   HGB 15.6 16.7 13.1  HCT 45.2 49.0 39.2  MCV 86.1  --  88.9  PLT 300  --  233   Coagulation:  Recent Labs Lab 12/31/13 1655  LABPROT 14.1  INR 1.09   Cardiac Enzymes: No results found for this basename: CKTOTAL, CKMB, CKMBINDEX, TROPONINI,  in the last 168 hours Urinalysis:   Recent Labs Lab 12/31/13 1713  COLORURINE YELLOW  LABSPEC 1.017  PHURINE 5.5  GLUCOSEU NEGATIVE  HGBUR NEGATIVE  BILIRUBINUR NEGATIVE  KETONESUR NEGATIVE  PROTEINUR NEGATIVE  UROBILINOGEN 1.0  NITRITE NEGATIVE  LEUKOCYTESUR TRACE*   Lipid Panel    Component Value Date/Time   CHOL 173 01/01/2014 0250   TRIG 163* 01/01/2014 0250   HDL 33* 01/01/2014 0250   CHOLHDL 5.2 01/01/2014 0250   VLDL 33 01/01/2014 0250   LDLCALC 107* 01/01/2014 0250   HgbA1C  Lab Results  Component Value Date   HGBA1C 6.4* 01/01/2014    Urine Drug Screen:     Component Value Date/Time   LABOPIA NONE DETECTED 12/31/2013 1713   COCAINSCRNUR NONE DETECTED 12/31/2013 1713   LABBENZ NONE DETECTED 12/31/2013 1713   AMPHETMU NONE DETECTED 12/31/2013 1713   THCU NONE  DETECTED 12/31/2013 1713   LABBARB NONE DETECTED 12/31/2013 1713    Alcohol Level:   Recent Labs Lab 12/31/13 1655  ETH <11    Ct Angio Head W/cm &/or Wo Cm 12/31/2013    1. No major intracranial arterial occlusion or flow limiting proximal stenosis.  2. 4 mm slightly ill-defined hyperattenuating focus along the posterior margin of the proximal right PCA, of uncertain etiology. This may reflect a prominent venous structure. It does not clearly communicate with the adjacent PCA, although a partially thrombosed aneurysm cannot be completely excluded but is felt unlikely. Follow-up CTA could be performed in 6-12 months to assess stability.  3. Mild cervical carotid atherosclerosis without stenosis.  4. 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     Ct Head Wo Contrast 12/31/2013    No intracranial hemorrhage.  Chronic small vessel ischemic change. Additionally within the right parietal white matter is a more focal area of decreased attenuation which may represent an age-indeterminate infarct.     MRI / MRA of the brain 12/31/2013 1. 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  2. Small, acute right parietal infarcts.  3. No major intracranial arterial occlusion or proximal stenosis. Mild branch vessel irregularity.  4. 4 mm abnormality along the posterior aspect of the proximal right  PCA described on CTA remains indeterminate but may reflect a partially thrombosed aneurysm.   Carotid Doppler  See CTA of neck - cancel carotid doppler  2D Echocardiogram  Pending  CXR    EKG - SR rate 97 BPM - For complete results please see formal report.   Therapy Recommendations - pending  Physical Exam    General:  Mental Status:  Alert, oriented, thought content appropriate. Dysarthria. Able to follow 3 step commands without difficulty.   Cranial Nerves:  II: Discs not seen; Visual fields grossly normal, pupils equal, round, reactive to light and accommodation  III,IV, VI: ptosis OS chronic, extra-ocular motions intact bilaterally  V,VII: smile symmetric, facial light touch sensation normal bilaterally  VIII: hearing normal bilaterally  IX,X: gag reflex present  XI: bilateral shoulder shrug  XII: midline tongue extension without atrophy or fasciculations  Motor:  Mild weakness right arm and leg. RUE 4+/5 - grip 3/5 RLE 4/5 Tone and bulk:normal tone throughout; no atrophy noted  Sensory: Pinprick and light  touch impaired in the right side.  Deep Tendon Reflexes:  Right: Upper Extremity Left: Upper extremity  biceps (C-5 to C-6) 2/4 biceps (C-5 to C-6) 2/4  tricep (C7) 2/4 triceps (C7) 2/4  Brachioradialis (C6) 2/4 Brachioradialis (C6) 2/4  Lower Extremity Lower Extremity  quadriceps (L-2 to L-4) 2/4 quadriceps (L-2 to L-4) 2/4  Achilles (S1) 2/4 Achilles (S1) 2/4  Plantars:  Right: downgoing Left: downgoing  Cerebellar:  Can not perform finger to nose and heel to shin in the right side  Gait:  Not tested.   ASSESSMENT Mr. Ulyses Jarredngus W Tandon is a 73 y.o. male presenting with right hemiparesis and dysarthria.. Status post IV t-PA but stopped due to oral bleeding. Patient received 56 cc of the total 76 cc dose.  Head CT - No intracranial hemorrhage. 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  Infarcts felt to be embolic of unknown source On aspirin 81 mg orally every day prior to admission. Now on aspirin 325 mg daily for secondary stroke prevention. Patient with resultant mild right heliparesis. Stroke work up underway.   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   4 mm slightly ill-defined hyperattenuating focus along the posterior margin of the proximal right PCA,  of uncertain etiology. A partially thrombosed aneurysm cannot be completely excluded  Hypertension history  Hyperlipidemia history - Chol - 173; LDL - 107. No statin PTA. Lipitor 10 mg daily started .   Leukocytosis - resolved  Hypokalemia - 3.9 today  II/VI systolic murmer - await 2-D echo  Hemoglobin A1c 6.4  Depression  Plan TEE   Hospital day # 2  TREATMENT/PLAN  Add aspirin 325 mg orally every day for secondary stroke prevention if no bleeding on F/U study.  Await therapy evals  Lipitor added  Await echo  Lovenox added  Will contact cardiology and make patient n.p.o. after midnight for TEE tomorrow.  Delton Seeavid Rinehuls PA-C [facility] Pager [phone] 01/02/2014, 12:59 PM   SIGNED  Appreciate PT evaluation, needs CIR MRI likely cardio embolic strokes.  TEE in AM Pt appears depressed with curret divorce situation.  Asked if he wants to speak with Psychiatry by he refused. Called pt's daughter via phone and explained the case at pt's request.    This patient is ill and at significant risk of neurological worsening, constant monitoring of vital signs, hemodynamics,respiratory and cardiac monitoring,review of multiple databases, neurological assessment, discussion with family, other specialists and medical decision making of high complexity.I have made any additions or clarifications directly to the above note.  Pauletta BrownsZEYLIKMAN, [place]     To contact Stroke Continuity provider, please refer to [email or web address]. After hours, contact General Neurology

## 2014-01-04 NOTE — Progress Notes (Signed)
Stroke Team Progress Note  HISTORY Calvin Merritt is a 73 y.o. male, right handed, with a past medical history significant for HTN, hyperlipidemia, left hip surgery few years ago, brought in via EMS as a code stroke due to acute onset of right hemiparesis and dysarthria. He lives by himself, and said that today 8/14 at 230p when he got into his truck he couldn't use the key to turn the truck on and was unable to grip the steering wheel. He probably stayed inside the truck for half an hour trying to maneuver the truck, then fell out of the truck and hit his head when he tried to get out the truck. He called his son who found him with slurred speech and having difficulty using the right side and walking.  EMS was summoned and patient brought to the ED.  Initial NIHSS 9. CT brain with " focal area of decreased attenuation right parietal region". The left MCA seems to be denser than the right. Patient takes aspirin daily. Patient was administered tPA. He was admitted to the neuro ICU for further evaluation and treatment.   SUBJECTIVE Patient lying in bed. Just back from TEE at 930 this am. Tolerate procedure well. He still has right sided hemiparesis but improved some. Therapy recommend inpt rehab. He requested Dr. Roda Shutters to call his daughter, who is supposed to be on her way here.  OBJECTIVE Most recent Vital Signs: Filed Vitals:   01/04/14 0905 01/04/14 0920 01/04/14 1022 01/04/14 1037  BP: 137/65 133/75 131/51   Pulse: 50 54 54 40  Temp:   98.2 F (36.8 C)   TempSrc:   Oral   Resp: 12 18 18    Height:      Weight:      SpO2: 96% 95% 95%    CBG (last 3)  No results found for this basename: GLUCAP,  in the last 72 hours  IV Fluid Intake:   . sodium chloride 500 mL (01/04/14 0834)    MEDICATIONS  . aspirin EC  325 mg Oral Daily  . atorvastatin  10 mg Oral q1800  . enoxaparin (LOVENOX) injection  40 mg Subcutaneous Q24H  . pantoprazole  40 mg Oral Daily   PRN:  acetaminophen, acetaminophen,  labetalol, senna-docusate  Diet:  NPO  Activity:  Up with assistance DVT Prophylaxis:  Lovenox 40 mg sq daily   CLINICALLY SIGNIFICANT STUDIES Basic Metabolic Panel:   Recent Labs Lab 12/31/13 1655 12/31/13 1717 01/02/14 0730  NA 142 140 141  K 3.5* 3.3* 3.9  CL 104 108 108  CO2 21  --  24  GLUCOSE 176* 176* 114*  BUN 16 17 13   CREATININE 1.29 1.40* 1.19  CALCIUM 9.6  --  9.0   Liver Function Tests:   Recent Labs Lab 12/31/13 1655  AST 16  ALT 12  ALKPHOS 68  BILITOT 0.7  PROT 7.3  ALBUMIN 3.9   CBC:   Recent Labs Lab 12/31/13 1655 12/31/13 1717 01/02/14 0730  WBC 15.5*  --  10.0  NEUTROABS 11.2*  --   --   HGB 15.6 16.7 13.1  HCT 45.2 49.0 39.2  MCV 86.1  --  88.9  PLT 300  --  233   Coagulation:   Recent Labs Lab 12/31/13 1655  LABPROT 14.1  INR 1.09   Cardiac Enzymes: No results found for this basename: CKTOTAL, CKMB, CKMBINDEX, TROPONINI,  in the last 168 hours Urinalysis:   Recent Labs Lab 12/31/13 1713  COLORURINE YELLOW  LABSPEC 1.017  PHURINE 5.5  GLUCOSEU NEGATIVE  HGBUR NEGATIVE  BILIRUBINUR NEGATIVE  KETONESUR NEGATIVE  PROTEINUR NEGATIVE  UROBILINOGEN 1.0  NITRITE NEGATIVE  LEUKOCYTESUR TRACE*   Lipid Panel    Component Value Date/Time   CHOL 173 01/01/2014 0250   TRIG 163* 01/01/2014 0250   HDL 33* 01/01/2014 0250   CHOLHDL 5.2 01/01/2014 0250   VLDL 33 01/01/2014 0250   LDLCALC 107* 01/01/2014 0250   HgbA1C  Lab Results  Component Value Date   HGBA1C 6.4* 01/01/2014    Urine Drug Screen:     Component Value Date/Time   LABOPIA NONE DETECTED 12/31/2013 1713   COCAINSCRNUR NONE DETECTED 12/31/2013 1713   LABBENZ NONE DETECTED 12/31/2013 1713   AMPHETMU NONE DETECTED 12/31/2013 1713   THCU NONE DETECTED 12/31/2013 1713   LABBARB NONE DETECTED 12/31/2013 1713    Alcohol Level:   Recent Labs Lab 12/31/13 1655  ETH <11    Ct Angio Head W/cm &/or Wo Cm 12/31/2013    1. No major intracranial arterial occlusion or  flow limiting proximal stenosis.  2. 4 mm slightly ill-defined hyperattenuating focus along the posterior margin of the proximal right PCA, of uncertain etiology. This may reflect a prominent venous structure. It does not clearly communicate with the adjacent PCA, although a partially thrombosed aneurysm cannot be completely excluded but is felt unlikely. Follow-up CTA could be performed in 6-12 months to assess stability.  3. Mild cervical carotid atherosclerosis without stenosis.  4. 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   Ct Head Wo Contrast 12/31/2013    No intracranial hemorrhage.  Chronic small vessel ischemic change. Additionally within the right parietal white matter is a more focal area of decreased attenuation which may represent an age-indeterminate infarct.    MRI / MRA of the brain 12/31/2013 1. 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  2. Small, acute right parietal infarcts.  3. No major intracranial arterial occlusion or proximal stenosis. Mild branch vessel irregularity.  4. 4 mm abnormality along the posterior aspect of the proximal right  PCA described on CTA remains indeterminate but may reflect a partially thrombosed aneurysm.  Carotid Doppler  No evidence of hemodynamically significant internal carotid artery stenosis. Vertebral artery flow is antegrade.   2D Echocardiogram EF 55-60% with no source of embolus.  TEE sm PFO, no obvious SOE   EKG - SR rate 97 BPM - For complete results please see formal report.   Therapy Recommendations - CIR  Physical Exam   General:  Mental Status:  Alert, oriented, thought content appropriate. Dysarthria. Able to follow 3 step commands without difficulty.  Cranial Nerves:  II: Discs not seen; Visual fields grossly normal, pupils equal, round, reactive to light and  accommodation  III,IV, VI: ptosis OS chronic, extra-ocular motions intact bilaterally  V,VII: smile symmetric, facial light touch sensation normal bilaterally  VIII: hearing normal bilaterally  IX,X: gag reflex present  XI: bilateral shoulder shrug  XII: midline tongue extension without atrophy or fasciculations  Motor:  Mild weakness right arm and leg. RUE 4+/5 - grip 3/5 RLE 4/5 Tone and bulk:normal tone throughout; no atrophy noted  Sensory: Pinprick and light touch impaired in the right side.  Deep Tendon Reflexes:  Right: Upper Extremity Left: Upper extremity  biceps (C-5 to C-6) 2/4 biceps (C-5 to C-6) 2/4  tricep (C7) 2/4  triceps (C7) 2/4  Brachioradialis (C6) 2/4 Brachioradialis (C6) 2/4  Lower Extremity Lower Extremity  quadriceps (L-2 to L-4) 2/4 quadriceps (L-2 to L-4) 2/4  Achilles (S1) 2/4 Achilles (S1) 2/4  Plantars:  Right: downgoing Left: downgoing  Cerebellar:  Can not perform finger to nose and heel to shin in the right side  Gait:  Not tested.   ASSESSMENT Calvin Merritt is a 73 y.o. male presenting with right hemiparesis and dysarthria.. Status post IV t-PA but stopped due to oral bleeding. Patient received 56 cc of the total 76 cc dose.  Head CT - No intracranial hemorrhage. 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  Infarcts felt to be embolic of unknown source. No hx racing heart, afib sx.  TEE with PFO. On aspirin 81 mg orally every day prior to admission. Now on aspirin 325 mg daily for secondary stroke prevention. Patient with resultant mild right heliparesis. Needs CIR.   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   4 mm slightly ill-defined hyperattenuating focus along the posterior margin of the proximal right PCA, of uncertain etiology. A partially thrombosed  aneurysm cannot be completely excluded  Hypertension history  Hyperlipidemia history - Chol - 173; LDL - 107. No statin PTA. Lipitor 10 mg daily started .   Leukocytosis - resolved  Hypokalemia - 3.9 today  Hemoglobin A1c 6.4  Depression -  appears depressed with current divorce situation.  Refused Psychiatry contact.  Hospital day # 4  TREATMENT/PLAN  Continue  aspirin 325 mg orally every day for secondary stroke prevention   Continue Lipitor  PFO (patent foramen ovale) likely an incidental finding. Will check LE venous dopplers for DVT as possible cause of stroke. If negative, If TEE negative, a [facility] electrophysiologist will consult and consider placement of an implantable loop recorder to evaluate for atrial fibrillation as etiology of stroke. This has been explained to patient/family by Dr. Roda Shutters and they are agreeable. Disposition:  Rehab. They are following. Await bed. OP f/u of parotid mass Resume diet post TEE Dr. Roda Shutters called daughter and updated her about pt condition.  Annie Main, MSN, RN, ANVP-BC, ANP-BC, [facility] Pager: [phone] 01/04/2014 12:16 PM  I, the attending vascular neurologist, have personally obtained a history, examined the patient, evaluated laboratory data, individually viewed imaging studies, and formulated the assessment and plan of care.  I have made any additions or clarifications directly to the above note and agree with the findings and plan as currently documented.   Marvel Plan, MD PhD Stroke Neurology 01/04/2014 6:44 PM   To contact Stroke Continuity provider, please refer to [email or web address]. After hours, contact General Neurology

## 2014-01-04 NOTE — Progress Notes (Signed)
*  PRELIMINARY RESULTS* Vascular Ultrasound Lower extremity venous duplex has been completed.  Preliminary findings: no evidence of DVT.  Farrel DemarkJill Eunice, RDMS, RVT  01/04/2014, 2:14 PM

## 2014-01-04 NOTE — Progress Notes (Signed)
LE doppler negative for VTE in setting of PFO. Recommend Loop recorder placement. Have contacted cardiology for placement; will confirm in am. Anticipate d/c to inpatient rehab tomorrow (01/05/2014) as soon as loop placed.  [name], MSN, RN, ANVP-BC, ANP-BC, GNP-BC [facility] Pager: [phone] 01/04/2014 4:35 PM

## 2014-01-04 NOTE — Progress Notes (Signed)
Physical Therapy Treatment Patient Details Name: Calvin Merritt MRN: [id number] DOB: 1940/08/06 Today's Date: 01/04/2014    History of Present Illness Pt adm with rt sided weakness. CT - with focal area of decreased attenuation right parietal region.  MRI - Patchy areas of acute infarction in the left MCA territory.  Small, acute right parietal infarcts    PT Comments    Pt with improved R UE strength but remains to have ataxia in R UE/LE, impaired co-ordination, impaired sequencing and increased falls risk. Pt con't to be motivated to work with PT. Con't to recommend CIR upon d/c once medically stable.   Follow Up Recommendations  CIR     Equipment Recommendations       Recommendations for Other Services Rehab consult     Precautions / Restrictions Precautions Precautions: Fall Restrictions Weight Bearing Restrictions: No    Mobility  Bed Mobility Overal bed mobility:  (pt up in chair upon PT arrival)                Transfers Overall transfer level: Needs assistance Equipment used: Rolling walker (2 wheeled) Transfers: Sit to/from Stand Sit to Stand: Mod assist         General transfer comment: pt required 2 attempts to stand, increased time. v/c's to achieve terminal bilat knee extension  Ambulation/Gait Ambulation/Gait assistance: Mod assist;+2 physical assistance Ambulation Distance (Feet): 50 Feet Assistive device: Rolling walker (2 wheeled) Gait Pattern/deviations: Step-to pattern;Ataxic;Narrow base of support (R lateral lean) Gait velocity: slow Gait velocity interpretation: Below normal speed for age/gender General Gait Details: max tactile cues at hips to maintain midline posture, max tactile cues at R hip to facilitate proper sequential gait pattern. Pt required max v/c's to increase step height to clear foot and to achieve terminal R knee extension during stance phase   Stairs            Wheelchair Mobility    Modified Rankin (Stroke  Patients Only) Modified Rankin (Stroke Patients Only) Pre-Morbid Rankin Score: No symptoms Modified Rankin: Moderately severe disability     Balance           Standing balance support: Bilateral upper extremity supported Standing balance-Leahy Scale: Poor Standing balance comment: requires physical assist for safe standing                    Cognition Arousal/Alertness: Awake/alert Behavior During Therapy: WFL for tasks assessed/performed Overall Cognitive Status: Impaired/Different from baseline Area of Impairment: Safety/judgement;Problem solving   Current Attention Level: Sustained     Safety/Judgement: Decreased awareness of safety   Problem Solving: Slow processing;Requires verbal cues;Requires tactile cues;Difficulty sequencing      Exercises      General Comments        Pertinent Vitals/Pain Pain Assessment: No/denies pain    Home Living                      Prior Function            PT Goals (current goals can now be found in the care plan section) Progress towards PT goals: Progressing toward goals    Frequency  Min 4X/week    PT Plan Current plan remains appropriate    Co-evaluation             End of Session Equipment Utilized During Treatment: Gait belt Activity Tolerance: Patient tolerated treatment well Patient left:  (with transporter in w/c for vascular lab)     Time:  [id number] PT Time Calculation (min): 23 min  Charges:  $Gait Training: 23-37 mins                    G Codes:      Marcene BrawnChadwell, Tyniah Kastens Marie 01/04/2014, 2:04 PM  Lewis ShockAshly Varetta Chavers, PT, DPT Pager #: [phone] Office #: [phone]

## 2014-01-04 NOTE — Progress Notes (Signed)
*  PRELIMINARY RESULTS* Echocardiogram Echocardiogram Transesophageal has been performed.  Jeryl ColumbiaLLIOTT, Marites Nath 01/04/2014, 9:17 AM

## 2014-01-04 NOTE — CV Procedure (Signed)
Procedure: TEE  Indication: CVA  Sedation: Versed 5 mg IV, Fentanyl 50 mcg IV  Findings: Please see echo section for full report.  Normal LV size with mild LV hypertrophy. EF 55-60%.  Trileaflet, severely calcified aortic valve with moderate aortic stenosis (mean gradient 25 mmHg).  Mild AI and trivial MR.  Normal RV size and systolic function.  No LA appendage thrombus.  Small PFO, bubbles crossed only with valsalva.  Grade III plaque in descending thoracic aorta.  Normal atrial sizes.   Small PFO is potential source of embolus.   Calvin AnconaDalton Lyndon Merritt 01/04/2014 8:59 AM

## 2014-01-05 ENCOUNTER — Encounter (HOSPITAL_COMMUNITY)
Admission: EM | Disposition: A | Discharge status: Rehabilitation Facility | Payer: Self-pay | Source: Home / Self Care | Attending: Neurology

## 2014-01-05 ENCOUNTER — Inpatient Hospital Stay (HOSPITAL_COMMUNITY)
Admission: RE | Admit: 2014-01-05 | Discharge: 2014-01-22 | DRG: 945 | Disposition: A | Discharge status: Home Health | Payer: Medicare Other | Source: Intra-hospital | Attending: Physical Medicine & Rehabilitation | Admitting: Physical Medicine & Rehabilitation

## 2014-01-05 ENCOUNTER — Encounter (HOSPITAL_COMMUNITY): Payer: Self-pay | Admitting: Cardiology

## 2014-01-05 ENCOUNTER — Encounter (HOSPITAL_COMMUNITY): Payer: Self-pay | Admitting: *Deleted

## 2014-01-05 DIAGNOSIS — Q211 Atrial septal defect: Secondary | ICD-10-CM

## 2014-01-05 DIAGNOSIS — I350 Nonrheumatic aortic (valve) stenosis: Secondary | ICD-10-CM

## 2014-01-05 DIAGNOSIS — F32A Depression, unspecified: Secondary | ICD-10-CM | POA: Diagnosis present

## 2014-01-05 DIAGNOSIS — Z803 Family history of malignant neoplasm of breast: Secondary | ICD-10-CM

## 2014-01-05 DIAGNOSIS — B1920 Unspecified viral hepatitis C without hepatic coma: Secondary | ICD-10-CM | POA: Diagnosis present

## 2014-01-05 DIAGNOSIS — I6340 Cerebral infarction due to embolism of unspecified cerebral artery: Secondary | ICD-10-CM

## 2014-01-05 DIAGNOSIS — Z79899 Other long term (current) drug therapy: Secondary | ICD-10-CM | POA: Diagnosis not present

## 2014-01-05 DIAGNOSIS — Z5189 Encounter for other specified aftercare: Principal | ICD-10-CM

## 2014-01-05 DIAGNOSIS — Z905 Acquired absence of kidney: Secondary | ICD-10-CM

## 2014-01-05 DIAGNOSIS — I6350 Cerebral infarction due to unspecified occlusion or stenosis of unspecified cerebral artery: Secondary | ICD-10-CM

## 2014-01-05 DIAGNOSIS — R29898 Other symptoms and signs involving the musculoskeletal system: Secondary | ICD-10-CM | POA: Diagnosis present

## 2014-01-05 DIAGNOSIS — Z602 Problems related to living alone: Secondary | ICD-10-CM | POA: Diagnosis not present

## 2014-01-05 DIAGNOSIS — R4789 Other speech disturbances: Secondary | ICD-10-CM | POA: Diagnosis present

## 2014-01-05 DIAGNOSIS — Z9181 History of falling: Secondary | ICD-10-CM | POA: Diagnosis not present

## 2014-01-05 DIAGNOSIS — F3289 Other specified depressive episodes: Secondary | ICD-10-CM | POA: Diagnosis present

## 2014-01-05 DIAGNOSIS — Z800 Family history of malignant neoplasm of digestive organs: Secondary | ICD-10-CM

## 2014-01-05 DIAGNOSIS — I359 Nonrheumatic aortic valve disorder, unspecified: Secondary | ICD-10-CM | POA: Diagnosis present

## 2014-01-05 DIAGNOSIS — K5730 Diverticulosis of large intestine without perforation or abscess without bleeding: Secondary | ICD-10-CM | POA: Diagnosis present

## 2014-01-05 DIAGNOSIS — Q100 Congenital ptosis: Secondary | ICD-10-CM | POA: Diagnosis not present

## 2014-01-05 DIAGNOSIS — Z8249 Family history of ischemic heart disease and other diseases of the circulatory system: Secondary | ICD-10-CM | POA: Diagnosis not present

## 2014-01-05 DIAGNOSIS — F329 Major depressive disorder, single episode, unspecified: Secondary | ICD-10-CM

## 2014-01-05 DIAGNOSIS — I10 Essential (primary) hypertension: Secondary | ICD-10-CM | POA: Diagnosis present

## 2014-01-05 DIAGNOSIS — Z7982 Long term (current) use of aspirin: Secondary | ICD-10-CM

## 2014-01-05 DIAGNOSIS — I639 Cerebral infarction, unspecified: Secondary | ICD-10-CM | POA: Diagnosis present

## 2014-01-05 DIAGNOSIS — R4587 Impulsiveness: Secondary | ICD-10-CM | POA: Diagnosis present

## 2014-01-05 DIAGNOSIS — R269 Unspecified abnormalities of gait and mobility: Secondary | ICD-10-CM | POA: Diagnosis present

## 2014-01-05 DIAGNOSIS — E785 Hyperlipidemia, unspecified: Secondary | ICD-10-CM | POA: Diagnosis present

## 2014-01-05 DIAGNOSIS — Q2112 Patent foramen ovale: Secondary | ICD-10-CM

## 2014-01-05 DIAGNOSIS — R279 Unspecified lack of coordination: Secondary | ICD-10-CM | POA: Diagnosis present

## 2014-01-05 DIAGNOSIS — K118 Other diseases of salivary glands: Secondary | ICD-10-CM | POA: Diagnosis present

## 2014-01-05 DIAGNOSIS — G114 Hereditary spastic paraplegia: Secondary | ICD-10-CM | POA: Diagnosis present

## 2014-01-05 HISTORY — DX: Nonrheumatic aortic (valve) stenosis: I35.0

## 2014-01-05 HISTORY — PX: LOOP RECORDER IMPLANT: SHX5477

## 2014-01-05 HISTORY — DX: Patent foramen ovale: Q21.12

## 2014-01-05 HISTORY — DX: Atrial septal defect: Q21.1

## 2014-01-05 SURGERY — LOOP RECORDER IMPLANT
Anesthesia: LOCAL

## 2014-01-05 MED ORDER — DIPHENHYDRAMINE HCL 12.5 MG/5ML PO ELIX: 12.5000 mg | ORAL_SOLUTION | Freq: Four times a day (QID) | ORAL | Status: DC | PRN

## 2014-01-05 MED ORDER — ALUM & MAG HYDROXIDE-SIMETH 200-200-20 MG/5ML PO SUSP: 30.0000 mL | ORAL | Status: DC | PRN

## 2014-01-05 MED ORDER — ESCITALOPRAM OXALATE 5 MG PO TABS
5.0000 mg | ORAL_TABLET | Freq: Every day | ORAL | Status: DC
  Administered 2014-01-05 – 2014-01-21 (×17): 5 mg via ORAL
  Filled 2014-01-05 (×19): qty 1

## 2014-01-05 MED ORDER — PROCHLORPERAZINE MALEATE 5 MG PO TABS
5.0000 mg | ORAL_TABLET | Freq: Four times a day (QID) | ORAL | Status: DC | PRN
  Filled 2014-01-05: qty 2

## 2014-01-05 MED ORDER — ENOXAPARIN SODIUM 40 MG/0.4ML ~~LOC~~ SOLN
40.0000 mg | SUBCUTANEOUS | Status: DC
  Administered 2014-01-05 – 2014-01-21 (×17): 40 mg via SUBCUTANEOUS
  Filled 2014-01-05 (×18): qty 0.4

## 2014-01-05 MED ORDER — PROCHLORPERAZINE EDISYLATE 5 MG/ML IJ SOLN
5.0000 mg | Freq: Four times a day (QID) | INTRAMUSCULAR | Status: DC | PRN
  Filled 2014-01-05: qty 2

## 2014-01-05 MED ORDER — LIDOCAINE-EPINEPHRINE 1 %-1:100000 IJ SOLN
INTRAMUSCULAR | Status: AC
  Filled 2014-01-05: qty 1

## 2014-01-05 MED ORDER — FLEET ENEMA 7-19 GM/118ML RE ENEM: 1.0000 | ENEMA | Freq: Once | RECTAL | Status: AC | PRN

## 2014-01-05 MED ORDER — PANTOPRAZOLE SODIUM 40 MG PO TBEC
40.0000 mg | DELAYED_RELEASE_TABLET | Freq: Every day | ORAL | Status: DC
  Administered 2014-01-06 – 2014-01-22 (×17): 40 mg via ORAL
  Filled 2014-01-05 (×17): qty 1

## 2014-01-05 MED ORDER — SENNOSIDES-DOCUSATE SODIUM 8.6-50 MG PO TABS: 1.0000 | ORAL_TABLET | Freq: Every evening | ORAL | Status: DC | PRN

## 2014-01-05 MED ORDER — TRAZODONE HCL 50 MG PO TABS
25.0000 mg | ORAL_TABLET | Freq: Every evening | ORAL | Status: DC | PRN
  Administered 2014-01-06 – 2014-01-14 (×9): 50 mg via ORAL
  Filled 2014-01-05 (×9): qty 1

## 2014-01-05 MED ORDER — PROCHLORPERAZINE 25 MG RE SUPP
12.5000 mg | Freq: Four times a day (QID) | RECTAL | Status: DC | PRN
  Filled 2014-01-05: qty 1

## 2014-01-05 MED ORDER — ATORVASTATIN CALCIUM 10 MG PO TABS
10.0000 mg | ORAL_TABLET | Freq: Every day | ORAL | Status: DC
  Administered 2014-01-06 – 2014-01-21 (×16): 10 mg via ORAL
  Filled 2014-01-05 (×17): qty 1

## 2014-01-05 MED ORDER — GUAIFENESIN-DM 100-10 MG/5ML PO SYRP: 5.0000 mL | ORAL_SOLUTION | Freq: Four times a day (QID) | ORAL | Status: DC | PRN

## 2014-01-05 MED ORDER — ACETAMINOPHEN 325 MG PO TABS
325.0000 mg | ORAL_TABLET | ORAL | Status: DC | PRN
  Administered 2014-01-05: 650 mg via ORAL
  Filled 2014-01-05: qty 2

## 2014-01-05 MED ORDER — ASPIRIN EC 325 MG PO TBEC
325.0000 mg | DELAYED_RELEASE_TABLET | Freq: Every day | ORAL | Status: DC
  Administered 2014-01-06 – 2014-01-22 (×17): 325 mg via ORAL
  Filled 2014-01-05 (×19): qty 1

## 2014-01-05 MED ORDER — BISACODYL 10 MG RE SUPP: 10.0000 mg | Freq: Every day | RECTAL | Status: DC | PRN

## 2014-01-05 NOTE — Progress Notes (Signed)
Ranelle Oyster, MD Physician Signed Physical Medicine and Rehabilitation Consult Note Service date: 01/03/2014 10:00 AM  Related encounter: Admission (Current) from 12/31/2013 in [facility] NEUROSCIENCE           Physical Medicine and Rehabilitation Consult   Reason for Consult: Right sided weakness and Slurred speech.   Referring Physician:  Dr. Roda Shutters.      HPI: Calvin Merritt is a 73 y.o. male with history of Hep C, spastic paraparesis, HTN, who was admitted on 12/31/13 with right sided weakness and slurred speech.  Patient reported that he had difficulty turning the key or gripping the wheel in his truck for about 30 minutes then fell out of his truck while trying to get out and struck his head. EMS activated and CT head in ED negative for hemorrhage. MRI brain with patchy areas of acute infarction in the left MCA territory, predominantly involving the parietal lobe and small acute right parietal infarct. MRA brain with  4 mm abnormality along the posterior aspect of the proximal right PCA described on CTA remains indeterminate with question of partially thrombosed aneurysm. 2D echo with EF 55-60%, moderate AVS, pseudonormal left ventricular filling pattern with grade 2 diastolic dysfunction. Carotid dopplers without significant ICA stenosis.    Neurology recommends ASA for embolic stroke of unknown source and TEE recommended for full workup.  Family reported that patient with stress as well as depressed mood due to pending divorce and recommended psych follow up but patient has  declined this. He continues to be limited by right sided weakness and mild dysarthria has resolved. MD, PT recommending CIR for follow up therapies.        Review of Systems  Constitutional: Negative for fever and chills.  HENT: Negative for ear pain, hearing loss and tinnitus.   Eyes: Negative for blurred vision and double vision.  Respiratory: Negative for cough.   Cardiovascular:  Negative for chest pain and palpitations.  Gastrointestinal: Negative for nausea, vomiting and abdominal pain.  Musculoskeletal: Negative for myalgias.  Neurological: Positive for tremors and focal weakness. Negative for headaches.  Psychiatric/Behavioral: Negative for depression and suicidal ideas.       Past Medical History   Diagnosis  Date   .  Hyperlipidemia     .  Hypertension     .  Spastic paraparesis     .  Hepatitis C antibody test positive     .  Acetabular fracture         left   .  Diverticulosis         Past Surgical History   Procedure  Laterality  Date   .  Nephrectomy    11/05   .  Hip surgery    05/11       Family History   Problem  Relation  Age of Onset   .  Stomach cancer  Sister     .  Breast cancer  Sister     .  Heart attack  Father     .  Hypertension  Mother     .  Aneurysm  Mother      Social History:    Lives alone.  reports that he has never smoked. He has never used smokeless tobacco. He reports that he does not drink alcohol or use illicit drugs.   Allergies: No Known Allergies    Medications Prior to Admission   Medication  Sig  Dispense  Refill   .  amLODipine (NORVASC) 10 MG tablet  Take 10 mg by mouth daily.         Marland Kitchen  aspirin EC 81 MG tablet  Take 81 mg by mouth daily.         .  benazepril (LOTENSIN) 20 MG tablet  Take 20 mg by mouth daily.         .  furosemide (LASIX) 20 MG tablet  Take 20 mg by mouth daily.         .  Multiple Vitamin (MULTIVITAMIN) tablet  Take 1 tablet by mouth daily.            Home: Home Living Family/patient expects to be discharged to:: Private residence Living Arrangements: Alone Available Help at Discharge: Family Type of Home: House Home Access: Ramped entrance Home Layout: Two level;Laundry or work area in [employer] Equipment: Environmental consultant - 2 wheels Additional Comments: Unsure if family willing/able to provide assistance at dc.  Lives With: Alone   Functional History: Prior Function Level of  Independence: Independent Functional Status:   Mobility: Bed Mobility Overal bed mobility: Needs Assistance Bed Mobility: Supine to Sit Supine to sit: Min assist General bed mobility comments: Assist to untangle rt foot from bedrail as pt unaware of where his leg was. Verbal cues fro technique. Transfers Overall transfer level: Needs assistance Equipment used: Rolling walker (2 wheeled) Transfers: Sit to/from [employer] Sit to Stand: Min assist;Mod assist Stand pivot transfers: Mod assist General transfer comment: Verbal/tactile cues for hand placement. Assist to bring hips up - more assist from low chair. Pt with difficulty moving rt leg when attempting to pivot to chair with walker.    ADL:   Cognition: Cognition Overall Cognitive Status: Impaired/Different from baseline Arousal/Alertness: Awake/alert Orientation Level: Oriented X4 Attention: Selective Selective Attention: Appears intact Memory: Appears intact Awareness: Appears intact Problem Solving: Appears intact Executive Function: Sequencing;Organizing Sequencing: Appears intact Organizing: Appears intact Safety/Judgment: Appears intact Cognition Arousal/Alertness: Awake/alert Behavior During Therapy: WFL for tasks assessed/performed Overall Cognitive Status: Impaired/Different from baseline Area of Impairment: Safety/judgement;Awareness;Problem solving Safety/Judgement: Decreased awareness of safety;Decreased awareness of deficits Awareness: Intellectual Problem Solving: Requires verbal cues;Requires tactile cues General Comments: Pt with rt side inattention and neglect. Pt believes he can go home this afternoon.   Blood pressure 155/67, pulse 52, temperature 98.2 F (36.8 C), temperature source Oral, resp. rate 20, height 6\' 1"  (1.854 m), weight 83.6 kg (184 lb 4.9 oz), SpO2 97.00%. Physical Exam  Constitutional: He appears well-developed and well-nourished.  HENT:   Head: Normocephalic and  atraumatic.  Eyes: Conjunctivae are normal. Pupils are equal, round, and reactive to light.  Neck: No JVD present. No tracheal deviation present. No thyromegaly present.  Cardiovascular: Normal rate.   Respiratory: No respiratory distress.  GI: He exhibits no distension.  Musculoskeletal: Normal range of motion.  Neurological:  Alert, a little impulsive, fair insight and awareness. Right upper limb > Right lower limb ataxia with decreased fine motor coordination. +PD on right but re-focus and improve. No gross sensory differences right to left although has decreased position sense on the right. Strength 4- to 4/5 RUE prox to distal. LUE 4/5. Lower ext's 3+ HF and 4/5 Knees and ankle.   Psychiatric:  Pleasant but a little impulsive.     No results found for this or any previous visit (from the past 24 hour(s)). Mr Brain Wo Contrast   01/01/2014   CLINICAL DATA:  Right hemiparesis and dysarthria status post tPA.  EXAM: MRI HEAD  WITHOUT CONTRAST  MRA HEAD WITHOUT CONTRAST  TECHNIQUE: Multiplanar, multiecho pulse sequences of the brain and surrounding structures were obtained without intravenous contrast. Angiographic images of the head were obtained using MRA technique without contrast.  COMPARISON:  Head CTA 12/31/2013.  FINDINGS: MRI HEAD FINDINGS  There are patchy areas of acute infarction involving the posterior left insula, left parietal operculum, and more superior aspect of the left parietal lobe. There are also a few subcentimeter foci of acute infarction in the right parietal lobe. There is no intracranial hemorrhage, mass, midline shift, or extra-axial fluid collection. There is very mild cytotoxic edema and areas of infarct without mass effect. There is moderate cerebral atrophy.  Distal right vertebral artery flow void appears small. 4 mm T2 hypointense focus is noted adjacent to the proximal right PCA corresponding to the abnormality described on CTA. Orbits are unremarkable. Paranasal  sinuses and mastoid air cells are clear.  MRA HEAD FINDINGS  Distal right vertebral artery is small. Distal left vertebral artery is patent and dominant. PICA origins appear patent. AICA and SCA origins appear patent. Basilar artery is patent without stenosis. There is no proximal PCA stenosis. There is mild bilateral PCA branch vessel irregularity. Corresponding to the abnormality on CTA is a 4 mm abnormality along the posterior aspect of the proximal right PCA, with possible trace flow related enhancement extending towards this lesion from the posterior aspect of the right P1 segment, which could reflect a thrombosed aneurysm.  Internal carotid arteries are patent from skullbase to carotid termini. MCAs are unremarkable aside from mild branch vessel irregularity, without high-grade proximal stenosis or major vessel occlusion. ACAs are unremarkable.  IMPRESSION: 1. 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 2. Small, acute right parietal infarcts. 3. No major intracranial arterial occlusion or proximal stenosis. Mild branch vessel irregularity. 4. 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   Electronically Signed   By: Sebastian AcheAllen  Grady   On: 01/01/2014 17:46    Mr Maxine GlennMra Head/brain Wo Cm   01/01/2014   CLINICAL DATA:  Right hemiparesis and dysarthria status post tPA.  EXAM: MRI HEAD WITHOUT CONTRAST  MRA HEAD WITHOUT CONTRAST  TECHNIQUE: Multiplanar, multiecho pulse sequences of the brain and surrounding structures were obtained without intravenous contrast. Angiographic images of the head were obtained using MRA technique without contrast.  COMPARISON:  Head CTA 12/31/2013.  FINDINGS: MRI HEAD FINDINGS  There are patchy areas of acute infarction involving the posterior left insula, left parietal operculum, and more superior aspect of the left  parietal lobe. There are also a few subcentimeter foci of acute infarction in the right parietal lobe. There is no intracranial hemorrhage, mass, midline shift, or extra-axial fluid collection. There is very mild cytotoxic edema and areas of infarct without mass effect. There is moderate cerebral atrophy.  Distal right vertebral artery flow void appears small. 4 mm T2 hypointense focus is noted adjacent to the proximal right PCA corresponding to the abnormality described on CTA. Orbits are unremarkable. Paranasal sinuses and mastoid air cells are clear.  MRA HEAD FINDINGS  Distal right vertebral artery is small. Distal left vertebral artery is patent and dominant. PICA origins appear patent. AICA and SCA origins appear patent. Basilar artery is patent without stenosis. There is no proximal PCA stenosis. There is mild bilateral PCA branch vessel irregularity. Corresponding to the abnormality on CTA  is a 4 mm abnormality along the posterior aspect of the proximal right PCA, with possible trace flow related enhancement extending towards this lesion from the posterior aspect of the right P1 segment, which could reflect a thrombosed aneurysm.  Internal carotid arteries are patent from skullbase to carotid termini. MCAs are unremarkable aside from mild branch vessel irregularity, without high-grade proximal stenosis or major vessel occlusion. ACAs are unremarkable.  IMPRESSION: 1. 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 2. Small, acute right parietal infarcts. 3. No major intracranial arterial occlusion or proximal stenosis. Mild branch vessel irregularity. 4. 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   Electronically Signed   By: Sebastian Ache   On: 01/01/2014 17:46     Assessment/Plan: Diagnosis: left and right MCA infarcts Does the need  for close, 24 hr/day medical supervision in concert with the patient's rehab needs make it unreasonable for this patient to be served in a less intensive setting? Yes Co-Morbidities requiring supervision/potential complications: htn, osa Due to bladder management, bowel management, safety, skin/wound care, disease management, medication administration, pain management and patient education, does the patient require 24 hr/day rehab nursing? Yes Does the patient require coordinated care of a physician, rehab nurse, PT (1-2 hrs/day, 5 days/week) and OT (1-2 hrs/day, 5 days/week) to address physical and functional deficits in the context of the above medical diagnosis(es)? Yes Addressing deficits in the following areas: balance, endurance, locomotion, strength, transferring, bowel/bladder control, bathing, dressing, feeding, grooming, toileting and psychosocial support Can the patient actively participate in an intensive therapy program of at least 3 hrs of therapy per day at least 5 days per week? Yes The potential for patient to make measurable gains while on inpatient rehab is excellent Anticipated functional outcomes upon discharge from inpatient rehab are modified independent  with PT, modified independent with OT, n/a with SLP. Estimated rehab length of stay to reach the above functional goals is: 8-11 days Does the patient have adequate social supports to accommodate these discharge functional goals? Yes Anticipated D/C setting: Home Anticipated post D/C treatments: HH therapy and Outpatient therapy Overall Rehab/Functional Prognosis: excellent   RECOMMENDATIONS: This patient's condition is appropriate for continued rehabilitative care in the following setting: CIR Patient has agreed to participate in recommended program. Yes Note that insurance prior authorization may be required for reimbursement for recommended care.   Comment: Rehab Admissions Coordinator to follow  up.   Thanks,   Ranelle Oyster, MD, [place] [facility]         01/03/2014

## 2014-01-05 NOTE — H&P (View-Only) (Signed)
Physical Medicine and Rehabilitation Admission H&P    Chief Complaint  Patient presents with  . Right sided weakness and slurred speech    HPI:  Calvin Merritt is a 73 y.o. male with history of Hep C, familial spastic paraparesis with gait disorder, HTN, who was admitted on 12/31/13 with right sided weakness and slurred speech. Patient reported that he had difficulty turning the key or gripping the wheel in his truck for about 30 minutes then fell out of his truck while trying to get out and struck his head. EMS activated and CT head in ED negative for hemorrhage. MRI brain with patchy areas of acute infarction in the left MCA territory, predominantly involving the parietal lobe and small acute right parietal infarct. MRA brain with 4 mm abnormality along the posterior aspect of the proximal right PCA described on CTA remains indeterminate with question of partially thrombosed aneurysm. 2D echo with EF 55-60%, moderate AVS, pseudonormal left ventricular filling pattern with grade 2 diastolic dysfunction. Carotid dopplers without significant ICA stenosis.   Neurology recommends ASA for embolic stroke of unknown source. TEE with severely calcified aortic valve with moderate AS, EF 55-60%, small PFO, bubbles cross with valsalva and grade III plaque in descending thoracic aorta.  BLE dopplers without DVT.   Family reported that patient with stress as well as depressed mood due to pending divorce and recommended psych follow up but patient has declined this. Loop recorder to be placed today.  He has had improvement in RUE strength but continues to have ataxia RUE/RLE, decreased balance as well as poor safety awareness. MD and Rehab team recommended CIR and patient admitted today for follow up therapies.   Review of Systems  HENT: Negative for hearing loss.   Eyes: Negative for blurred vision and double vision.  Respiratory: Negative for cough and shortness of breath.   Cardiovascular: Negative for  chest pain and palpitations.  Gastrointestinal: Negative for heartburn and nausea.  Musculoskeletal: Positive for falls. Negative for myalgias.  Neurological: Positive for focal weakness. Negative for dizziness, tingling, weakness and headaches.  Psychiatric/Behavioral: Positive for depression. The patient is nervous/anxious.     Past Medical History  Diagnosis Date  . Hyperlipidemia   . Hypertension   . Spastic paraparesis   . 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 Acetabular fracture     left  . Diverticulosis     Past Surgical History  Procedure Laterality Date  . Nephrectomy Right 11/05  . Hip surgery  05/11    L-acetabular fracture  . Tee without cardioversion N/A 01/04/2014    Procedure: TRANSESOPHAGEAL ECHOCARDIOGRAM (TEE);  Surgeon: Laurey Morale, MD;  Location: [facility] ENDOSCOPY;  Service: Cardiovascular;  Laterality: N/A;    Family History  Problem Relation Age of Onset  . Stomach cancer Sister   . Breast cancer Sister   . Heart attack Father   . Hypertension Mother   . Aneurysm Mother     Social History:  Divorced. Lives alone. Owns and manages a cemetery. He reports that he has never smoked. He has never used smokeless tobacco. He reports that he does not drink alcohol or use illicit drugs.   Allergies: No Known Allergies   Medications Prior to Admission  Medication Sig Dispense Refill  . amLODipine (NORVASC) 10 MG tablet Take 10 mg by mouth daily.      Marland Kitchen aspirin EC 81 MG tablet Take 81 mg by mouth daily.      . benazepril (LOTENSIN)  20 MG tablet Take 20 mg by mouth daily.      . furosemide (LASIX) 20 MG tablet Take 20 mg by mouth daily.      . Multiple Vitamin (MULTIVITAMIN) tablet Take 1 tablet by mouth daily.        Home: Home Living Family/patient expects to be discharged to:: Private residence Living Arrangements: Alone Available Help at Discharge: Family Type of Home: House Home Access: Ramped entrance Home Layout: Two level;Laundry or work area  in [employer] Equipment: Gilmer Mor - Manufacturing systems engineer - 2 wheels Additional Comments: Unsure if family willing/able to provide assistance at dc.  Lives With: Alone   Functional History: Prior Function Level of Independence: Independent Comments: reports he uses a quad cane but is independent  Functional Status:  Mobility: Bed Mobility Overal bed mobility:  (pt up in chair upon PT arrival) Bed Mobility: Supine to Sit Supine to sit: Min assist General bed mobility comments: pt received sitting EOB wtih RN staff Transfers Overall transfer level: Needs assistance Equipment used: Rolling walker (2 wheeled) Transfers: Sit to/from Stand Sit to Stand: Mod assist Stand pivot transfers: Mod assist General transfer comment: pt required 2 attempts to stand, increased time. v/c's to achieve terminal bilat knee extension Ambulation/Gait Ambulation/Gait assistance: Mod assist;+2 physical assistance Ambulation Distance (Feet): 50 Feet Assistive device: Rolling walker (2 wheeled) Gait Pattern/deviations: Step-to pattern;Ataxic;Narrow base of support (R lateral lean) Gait velocity: slow Gait velocity interpretation: Below normal speed for age/gender General Gait Details: max tactile cues at hips to maintain midline posture, max tactile cues at R hip to facilitate proper sequential gait pattern. Pt required max v/c's to increase step height to clear foot and to achieve terminal R knee extension during stance phase    ADL: ADL Overall ADL's : Needs assistance/impaired Eating/Feeding: Moderate assistance;Cueing for safety;Cueing for sequencing (up to mod assist (hand over hand) due to ataxia in RUE) Grooming: Moderate assistance;Cueing for safety;Cueing for sequencing (Up to mod assist (hand over hand) due to ataxic movements) Upper Body Bathing: Cueing for safety;Cueing for sequencing;Minimal assitance Lower Body Bathing: Moderate assistance;Cueing for safety;Cueing for sequencing Upper Body Dressing :  Minimal assistance;Cueing for safety;Cueing for sequencing Lower Body Dressing: Moderate assistance;Cueing for safety;Cueing for sequencing Toilet Transfer: Moderate assistance;Stand-pivot;RW;BSC General ADL Comments: Patient requried mod assist for sit<>stand secodnary to therapist assisted with lift & lower. Patient also requires min verbal cues for safety & sequencing. Patient performed stand pivot transfer onto [facility] using RW. Patient with decreased  safety awareness, patient requires up to moderate assistance with ADL tasks at this time, he believed he was able to walk without assistance. +2 needed for functional ambulation for safety at this time.   Cognition: Cognition Overall Cognitive Status: Impaired/Different from baseline Arousal/Alertness: Awake/alert Orientation Level: Oriented X4 Attention: Selective Selective Attention: Appears intact Memory: Appears intact Awareness: Appears intact Problem Solving: Appears intact Executive Function: Sequencing;Organizing Sequencing: Appears intact Organizing: Appears intact Safety/Judgment: Appears intact Cognition Arousal/Alertness: Awake/alert Behavior During Therapy: WFL for tasks assessed/performed Overall Cognitive Status: Impaired/Different from baseline Area of Impairment: Safety/judgement;Problem solving Current Attention Level: Sustained Memory: Decreased short-term memory Safety/Judgement: Decreased awareness of safety Awareness: Intellectual Problem Solving: Slow processing;Requires verbal cues;Requires tactile cues;Difficulty sequencing General Comments: Pt aware of R sided deficits however remains slightly impulsive. max directional v/c's with sequencing R LE  Physical Exam: Blood pressure 162/69, pulse 51, temperature 98.8 F (37.1 C), temperature source Oral, resp. rate 20, height 6\' 1"  (1.854 m), weight 83.6 kg (184 lb 4.9 oz), SpO2 98.00%. Physical  Exam Constitutional: He appears well-developed and well-nourished.    HENT: oral mucosa moist, dentition fair Head: Normocephalic and atraumatic.  Eyes: Conjunctivae are normal. Pupils are equal, round, and reactive to light. Left ptosis(birth injury)  Neck: No JVD present. No tracheal deviation present. No thyromegaly present.  Cardiovascular: Normal rate. Systolic murmur, reg rhythm  Respiratory: No respiratory distress. No wheezes or rales GI: He exhibits no distension. Bowels sounds + Musculoskeletal: Normal range of motion. Large ecchymotic area right upper arm.  Neurological:  Alert, still a little impulsive, fair insight and awareness. Right upper limb > Right lower limb ataxia with decreased fine motor coordination. +PD RUE. No gross sensory differences to light touch/pain right to left although has decreased position sense on the right. Strength 4- to 4/5 RUE prox to distal. LUE 5-/5. Lower ext's 3+ HF and 4/5 KE,  And 4/5 ADF/APF.  Psychiatric:  Pleasant and cooperative  No results found for this or any previous visit (from the past 48 hour(s)). No results found.     Medical Problem List and Plan: 1. 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  DVT Prophylaxis/Anticoagulation: Pharmaceutical: Lovenox 3. Pain Management: Tylenol prn.  4. Depression/Mood: Willing to start antidepressant. Willing to talk to psychologist about current impairments  as well as issues regarding ex-wife.  5. Neuropsych: This patient is capable of making decisions on his own behalf. 6. Skin/Wound Care: Routine pressure relief measures. Maintain adequate hydration and nutritional status.  7. Dyslipidemia: On lipitor     Post Admission Physician Evaluation: 1. Functional deficits secondary  to bilateral embolic infarcts, right MCA > Left parietal. 2. Patient is admitted to receive collaborative, interdisciplinary care between the physiatrist, rehab nursing staff, and therapy team. 3. Patient's level of  medical complexity and substantial therapy needs in context of that medical necessity cannot be provided at a lesser intensity of care such as a SNF. 4. Patient has experienced substantial functional loss from his/her baseline which was documented above under the "Functional History" and "Functional Status" headings.  Judging by the patient's diagnosis, physical exam, and functional history, the patient has potential for functional progress which will result in measurable gains while on inpatient rehab.  These gains will be of substantial and practical use upon discharge  in facilitating mobility and self-care at the household level. 5. Physiatrist will provide 24 hour management of medical needs as well as oversight of the therapy plan/treatment and provide guidance as appropriate regarding the interaction of the two. 6. 24 hour rehab nursing will assist with bladder management, bowel management, safety, skin/wound care, disease management, medication administration, pain management and patient education  and help integrate therapy concepts, techniques,education, etc. 7. PT will assess and treat for/with: Lower extremity strength, range of motion, stamina, balance, functional mobility, safety, adaptive techniques and equipment, NMR, cognitive perceptual awareness, stroke education, community reintegration.   Goals are: supervision. 8. OT will assess and treat for/with: ADL's, functional mobility, safety, upper extremity strength, adaptive techniques and equipment, NMR, cognitive perceptual awareness, leisure awareness, family ed.   Goals are: supervision. 9. SLP will assess and treat for/with: cognition, communication.  Goals are: supervision. 10. Case Management and Social Worker will assess and treat for psychological issues and discharge planning. 11. Team conference will be held weekly to assess progress toward goals and to determine barriers to discharge. 12. Patient will receive at least 3 hours of  therapy per day at least 5 days per week. 13. ELOS: 12-14  days       14. Prognosis:  excellent     Ranelle Oyster, MD, [facility] Physical Medicine & Rehabilitation   01/05/2014

## 2014-01-05 NOTE — Progress Notes (Signed)
Attempted to notify family via contact person and number listed, times [phone], [phone], [phone], without success, unable to leave message.

## 2014-01-05 NOTE — Consult Note (Signed)
ELECTROPHYSIOLOGY CONSULT NOTE  Patient ID: Calvin Merritt MRN: [id number], DOB/AGE: [birth year]   Admit date: 12/31/2013 Date of Consult: 01/05/2014  Primary Physician: Sandrea HughsMichael [name], MD Primary Cardiologist[facility] Reason for Consultation: Cryptogenic stroke; recommendations regarding Implantable Loop Recorder  History of Present Illness Jefferson Bing NeighborsW Gruenewald was admitted on 12/31/2013 with right hemiparesis and dysarthria.  Imaging demonstrated acute infarct in left MCA territory predominately involving parietal lobe.  He has undergone workup for stroke including echocardiogram and carotid dopplers.  The patient has been monitored on telemetry which has demonstrated sinus rhythm with runs of atrial tach.  TEE yesterday demonstrated normal LV size with mild LV hypertrophy, moderate AS, small PFO with negative LE venous dopplers.   Echocardiogram this admission demonstrated EF 55-60%, grade 2 diastolic dysfunction, LA 49.  Lab work is reviewed.  Prior to admission, the patient denies chest pain, shortness of breath, dizziness, palpitations, or syncope.  They are recovering from their stroke with plans to go to inpatient rehab at discharge.  EP has been asked to evaluate for placement of an implantable loop recorder to monitor for atrial fibrillation.  ROS is negative except as outlined above.    Past Medical History  Diagnosis Date  . Hyperlipidemia   . Hypertension   . Spastic paraparesis   . 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 Acetabular fracture     left  . Diverticulosis   . PFO (patent foramen ovale) 01/05/2014  . 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 Aortic valve stenosis, moderate 01/05/2014     Surgical History:  Past Surgical History  Procedure Laterality Date  . Nephrectomy Right 11/05  . Hip surgery  05/11    L-acetabular fracture  . Tee without  cardioversion N/A 01/04/2014    Procedure: TRANSESOPHAGEAL ECHOCARDIOGRAM (TEE);  Surgeon: Laurey Moralealton S McLean, MD;  [facility];  Service: Cardiovascular;  Laterality: N/A;     Prescriptions prior to admission  Medication Sig Dispense Refill  . amLODipine (NORVASC) 10 MG tablet Take 10 mg by mouth daily.      Marland Kitchen. aspirin EC 81 MG tablet Take 81 mg by mouth daily.      . benazepril (LOTENSIN) 20 MG tablet Take 20 mg by mouth daily.      . furosemide (LASIX) 20 MG tablet Take 20 mg by mouth daily.      . Multiple Vitamin (MULTIVITAMIN) tablet Take 1 tablet by mouth daily.        Inpatient Medications:  . aspirin EC  325 mg Oral Daily  . atorvastatin  10 mg Oral q1800  . enoxaparin (LOVENOX) injection  40 mg Subcutaneous Q24H  . pantoprazole  40 mg Oral Daily    Allergies: No Known Allergies  History   Social History  . Marital Status: Married    Spouse Name: N/A    Number of Children: N/A  . Years of Education: N/A   Occupational History  . Retired Engineer, [employer]    Social History Main Topics  . Smoking status: Never Smoker   . Smokeless tobacco: Never Used  . Alcohol Use: No  . Drug Use: No  . Sexual Activity: Not on file   Other Topics Concern  . Not on file   Social History Narrative  . No narrative on file     Family History  Problem Relation  Age of Onset  . Stomach cancer Sister   . Breast cancer Sister   . Heart attack Father   . Hypertension Mother   . Aneurysm Mother     BP 154/62  Pulse 54  Temp(Src) 98.5 F (36.9 C) (Oral)  Resp 18  Ht 6\' 1"  (1.854 m)  Wt 184 lb 4.9 oz (83.6 kg)  BMI 24.32 kg/m2  SpO2 100%  Physical Exam: Well developed and nourished in no acute distress HENT normal Neck supple with JVP-flat Carotids without bruits Clear Regular rate and rhythm, 2/6 murmur Abd-soft with active BS No Clubbing cyanosis edema Skin-warm and dry A & Oriented  Grossly normal sensory and motor function some dysarthria      Labs:     Lab Results  Component Value Date   WBC 10.0 01/02/2014   HGB 13.1 01/02/2014   HCT 39.2 01/02/2014   MCV 88.9 01/02/2014   PLT 233 01/02/2014    Recent Labs Lab 12/31/13 1655  01/02/14 0730  NA 142  < > 141  K 3.5*  < > 3.9  CL 104  < > 108  CO2 21  --  24  BUN 16  < > 13  CREATININE 1.29  < > 1.19  CALCIUM 9.6  --  9.0  PROT 7.3  --   --   BILITOT 0.7  --   --   ALKPHOS 68  --   --   ALT 12  --   --   AST 16  --   --   GLUCOSE 176*  < > 114*  < > = values in this interval not displayed. No results found for this basename: CKTOTAL,  CKMB,  CKMBINDEX,  TROPONINI    Radiology/Studies: Ct Angio Head W/cm &/or Wo Cm 12/31/2013   CLINICAL DATA:  Code stroke. Sudden onset right extremity weakness causing fall. Slurred speech.  EXAM: CT ANGIOGRAPHY HEAD AND NECK  TECHNIQUE: Multidetector CT imaging of the head and neck was performed using the standard protocol during bolus administration of intravenous contrast. Multiplanar CT image reconstructions and MIPs were obtained to evaluate the vascular anatomy. Carotid stenosis measurements (when applicable) are obtained utilizing NASCET criteria, using the distal internal carotid diameter as the denominator.  CONTRAST:  50mL OMNIPAQUE IOHEXOL 350 MG/ML SOLN  COMPARISON:  Head CT 12/31/2013.  Cervical spine MRI 03/28/2004.  FINDINGS: CTA HEAD FINDINGS  Postcontrast head CT images again demonstrate chronic small vessel ischemic disease and mild to moderate cerebral atrophy without evidence of acute intracranial hemorrhage, mass, midline shift, or abnormal enhancement. Orbits are unremarkable. Paranasal sinuses and mastoid air cells are clear.  Vertebral arteries are patent to the basilar, although the right is hypoplastic. PICA origins are patent. AICA and SCA origins are patent. Basilar artery is patent without stenosis. There are small bilateral posterior communicating arteries, right larger than left. The proximal right P2 segment courses anteriorly,  and immediately posterior to it is a 4 mm hyperattenuating focus which is less dense than the adjacent artery (series 402, image 244). PCAs are otherwise unremarkable.  Internal carotid arteries are patent from skullbase to carotid termini. Mild-to-moderate atherosclerotic calcification is present involving the carotid siphons. ACAs and MCAs are unremarkable.  Review of the MIP images confirms the above findings.  CTA NECK FINDINGS  Incidental note is made of a common origin of the brachiocephalic and left common carotid arteries, a normal variant. Mild atherosclerotic calcification is noted of the aortic arch and proximal left subclavian artery. Evaluation  of the proximal right common carotid artery is partially limited by motion. Mid and distal right common carotid artery is unremarkable. Mild calcified and noncalcified plaque is present at both carotid bifurcations without stenosis. Vertebral arteries are patent with the left being dominant in the right being diffusely hypoplastic.  There is an oval, well-circumscribed mass in the right parotid gland measuring 15 x 11 mm. This was partially imaged on 2005 MRI and appears unchanged in short axis measurement. Multiple subcentimeter nodules are present in both parotid glands, which may reflect small lymph nodes. Small lymph nodes are also noted in the lower neck. Diffuse bridging anterior osteophytosis is present throughout the cervical spine. Moderate to severe multilevel facet arthrosis is also noted.  Review of the MIP images confirms the above findings.  IMPRESSION: 1. No major intracranial arterial occlusion or flow limiting proximal stenosis. 2. 4 mm slightly ill-defined hyperattenuating focus along the posterior margin of the proximal right PCA, of uncertain etiology. This may reflect a prominent venous structure. It does not clearly communicate with the adjacent PCA, although a partially thrombosed aneurysm cannot be completely excluded but is felt unlikely.  Follow-up CTA could be performed in 6-12 months to assess stability. 3. Mild cervical carotid atherosclerosis without stenosis. 4. 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 These results were called by telephone at the time of interpretation on 12/31/2013 at 7:28 pm to Dr. Wyatt Portela , who verbally acknowledged these results.   Electronically Signed   By: Sebastian Ache   On: 12/31/2013 19:28   Ct Head Wo Contrast 12/31/2013   CLINICAL DATA:  Code stroke.  Syncopal episode.  EXAM: CT HEAD WITHOUT CONTRAST  TECHNIQUE: Contiguous axial images were obtained from the base of the skull through the vertex without intravenous contrast.  COMPARISON:  None.  FINDINGS: Prominent extra-axial spaces. The ventricles and sulci are unremarkable. No evidence for hemorrhage, mass effect or mass lesion. Periventricular white matter hypodensity compatible with chronic small vessel ischemic change. There is a more focal area of decreased attenuation within the right parietal white matter (image 23; series 2) which may represent an age indeterminate infarct. Calvarium is intact. Paranasal sinuses are unremarkable. Mastoid air cells are well aerated.  IMPRESSION: No intracranial hemorrhage.  Chronic small vessel ischemic change. Additionally within the right parietal white matter is a more focal area of decreased attenuation which may represent an age-indeterminate infarct.  Critical Value/emergent results were called by telephone at the time of interpretation on 12/31/2013 at 5:19 pm to Dr. Leroy Kennedy, who verbally acknowledged these results.   Electronically Signed   By: Annia Belt M.D.   On: 12/31/2013 17:24   12-lead ECG sinus rhythm, rate 97, normal intervals   Telemetry sinus brady/sinus rhythm    Assessment and Plan  Crytogenic stroke  Aortic Stenosis  Moderate  LAE  Moderate (49/2.36)  For LINQ insertion for cryptogenic  stroke Reviewed with pts who expresses understanding

## 2014-01-05 NOTE — Progress Notes (Signed)
Pt being discharged/transferred from 4N22 to [phone]. Report provided to receiving RN Anessa, Telemetry notified, IV at left hand saline lock left in place per receiving RN.

## 2014-01-05 NOTE — Progress Notes (Signed)
Patient returned from Loop recorder placement. Alert and in stable condition.

## 2014-01-05 NOTE — Discharge Summary (Signed)
Stroke Discharge Summary  Patient ID: Calvin Merritt   MRN: [id number]      DOB: [birth year]  Date of Admission: 12/31/2013 Date of Discharge: 01/05/2014  Attending Physician:  Marvel Plan, MD, Stroke MD  Consulting Physician(s):  Treatment Team:  Md Stroke, MD Rounding Lbcardiology, MD, Faith Rogue, MD (Physical Medicine & Rehabtilitation)  Patient's PCP:  Sandrea Hughs, MD  Discharge Diagnoses:  Principal Problem:   Cerebral embolism with cerebral infarction -  Patchy left MCA,  Status post IV t-PA, embolic of unknown source.  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 Hyperlipidemia   . Hypertension   . Spastic paraparesis   . 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 Acetabular fracture     left  . Diverticulosis    Past Surgical History  Procedure Laterality Date  . Nephrectomy Right 11/05  . Hip surgery  05/11    L-acetabular fracture  . Tee without cardioversion N/A 01/04/2014    Procedure: TRANSESOPHAGEAL ECHOCARDIOGRAM (TEE);  Surgeon: Laurey Morale, MD;  Location: [facility] ENDOSCOPY;  Service: Cardiovascular;  Laterality: N/A;    Medications to be continued on Rehab . aspirin EC  325 mg Oral Daily  . atorvastatin  10 mg Oral q1800  . enoxaparin (LOVENOX) injection  40 mg Subcutaneous Q24H  . pantoprazole  40 mg Oral Daily    LABORATORY STUDIES CBC    Component Value Date/Time   WBC 10.0 01/02/2014 0730   RBC 4.41 01/02/2014 0730   HGB 13.1 01/02/2014 0730   HCT 39.2 01/02/2014 0730   PLT 233 01/02/2014 0730   MCV 88.9 01/02/2014 0730   MCH 29.7 01/02/2014 0730   MCHC 33.4 01/02/2014 0730   RDW 13.3 01/02/2014 0730   LYMPHSABS 3.1 12/31/2013 1655   MONOABS 1.0 12/31/2013 1655    EOSABS 0.2 12/31/2013 1655   BASOSABS 0.0 12/31/2013 1655   CMP    Component Value Date/Time   NA 141 01/02/2014 0730   K 3.9 01/02/2014 0730   CL 108 01/02/2014 0730   CO2 24 01/02/2014 0730   GLUCOSE 114* 01/02/2014 0730   BUN 13 01/02/2014 0730   CREATININE 1.19 01/02/2014 0730   CALCIUM 9.0 01/02/2014 0730   PROT 7.3 12/31/2013 1655   ALBUMIN 3.9 12/31/2013 1655   AST 16 12/31/2013 1655   ALT 12 12/31/2013 1655   ALKPHOS 68 12/31/2013 1655   BILITOT 0.7 12/31/2013 1655   GFRNONAA 59* 01/02/2014 0730   GFRAA 68* 01/02/2014 0730   COAGS Lab Results  Component Value Date   INR 1.09 12/31/2013   Lipid Panel    Component Value Date/Time   CHOL 173 01/01/2014 0250   TRIG 163* 01/01/2014 0250   HDL 33* 01/01/2014 0250   CHOLHDL 5.2 01/01/2014 0250   VLDL 33 01/01/2014 0250   LDLCALC 107* 01/01/2014 0250   HgbA1C  Lab Results  Component Value Date   HGBA1C 6.4* 01/01/2014   Cardiac Panel (last 3 results) No results found for this basename: CKTOTAL, CKMB, TROPONINI, RELINDX,  in the last 72 hours Urinalysis    Component Value Date/Time   COLORURINE YELLOW 12/31/2013 1713   APPEARANCEUR CLEAR  12/31/2013 1713   LABSPEC 1.017 12/31/2013 1713   PHURINE 5.5 12/31/2013 1713   GLUCOSEU NEGATIVE 12/31/2013 1713   GLUCOSEU NEGATIVE 08/19/2013 0939   HGBUR NEGATIVE 12/31/2013 1713   BILIRUBINUR NEGATIVE 12/31/2013 1713   KETONESUR NEGATIVE 12/31/2013 1713   PROTEINUR NEGATIVE 12/31/2013 1713   UROBILINOGEN 1.0 12/31/2013 1713   NITRITE NEGATIVE 12/31/2013 1713   LEUKOCYTESUR TRACE* 12/31/2013 1713   Urine Drug Screen     Component Value Date/Time   LABOPIA NONE DETECTED 12/31/2013 1713   COCAINSCRNUR NONE DETECTED 12/31/2013 1713   LABBENZ NONE DETECTED 12/31/2013 1713   AMPHETMU NONE DETECTED 12/31/2013 1713   THCU NONE DETECTED 12/31/2013 1713   LABBARB NONE DETECTED 12/31/2013 1713    Alcohol Level    Component Value Date/Time   ETH <11 12/31/2013 1655     SIGNIFICANT DIAGNOSTIC STUDIES Ct  Angio Head W/cm &/or Wo Cm  12/31/2013  1. No major intracranial arterial occlusion or flow limiting proximal stenosis.  2. 4 mm slightly ill-defined hyperattenuating focus along the posterior margin of the proximal right PCA, of uncertain etiology. This may reflect a prominent venous structure. It does not clearly communicate with the adjacent PCA, although a partially thrombosed aneurysm cannot be completely excluded but is felt unlikely. Follow-up CTA could be performed in 6-12 months to assess stability.  3. Mild cervical carotid atherosclerosis without stenosis.  4. 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  Ct Head Wo Contrast  12/31/2013  No intracranial hemorrhage. Chronic small vessel ischemic change. Additionally within the right parietal white matter is a more focal area of decreased attenuation which may represent an age-indeterminate infarct.  MRI / MRA of the brain  12/31/2013  1. 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  2. Small, acute right parietal infarcts.  3. No major intracranial arterial occlusion or proximal stenosis. Mild branch vessel irregularity.  4. 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  Carotid Doppler No evidence of hemodynamically significant internal carotid artery stenosis. Vertebral artery flow is antegrade.  2D Echocardiogram EF 55-60% with no source of embolus.  TEE sm PFO, no obvious SOE  LE venous doppler negative EKG - SR rate 97 BPM - For complete results please see formal report.      History of Present Illness   Calvin Merritt is a 73 y.o. male, right handed, with a past medical history significant for HTN, hyperlipidemia, left hip surgery few years ago, brought in via EMS as a code  stroke due to acute onset of right hemiparesis and dysarthria.  He lives by himself, and said that today 8/14 at 230p when he got into his truck he couldn't use the key to turn the truck on and was unable to grip the steering wheel. He probably stayed inside the truck for half an hour trying to maneuver the truck, then fell out of the truck and hit his head when he tried to get out the truck. He called his son who found him with slurred speech and having difficulty using the right side and walking. EMS was summoned and patient brought to the ED. Initial NIHSS 9. CT brain with " focal area of decreased attenuation right parietal region". The left MCA seems to be denser than the right. Patient  takes aspirin daily. Patient was administered tPA. He was admitted to the neuro ICU for further evaluation and treatment.   Hospital Course  Status post IV t-PA, stopped early due to oral bleeding with patient only receiving 56 cc of the total 76 cc dose. This was no life-threatening. There were no further complications; post tPA Head CT with o intracranial hemorrhage. MRI  Confirmed Patchy areas of acute infarction in the left MCA territory, predominantly involving the parietal lobe, and also right parietal area. Infarcts felt to be embolic of unknown source. No hx racing heart, afib sx. TEE showed PFO. PFO (patent foramen ovale) likely an incidental finding. LE venous dopplers negative for DVT.  Loop recorder was placed. Patient was on aspirin 81 mg orally every day prior to admission. Changed to aspirin 325 mg daily for secondary stroke prevention. Patient with resultant mild right heliparesis. Needs CIR.   Patient with vascular risk factors of:  Hypertension history  Hyperlipidemia history - Chol - 173; LDL - 107. No statin PTA. Lipitor 10 mg daily started .  obstructive sleep apnea   Patient also with: 15 mm right parotid mass. This is suggestive of a primary parotid neoplasm, with benign neoplasm favored given  presence on 2005 study.  4 mm slightly ill-defined hyperattenuating focus along the posterior margin of the proximal right PCA, of uncertain etiology. A partially thrombosed aneurysm cannot be completely excluded  Leukocytosis - resolved  Hypokalemia - last check at 3.9  Depression - appears depressed with current divorce situation. Refused Psychiatry contact. Hemoglobin A1c 6.4   Patient with resultant right hemiparesis. Physical therapy, occupational therapy and speech therapy evaluated patient. All agreed inpatient rehab is needed. Patient's family is/are supportive and can provide care at discharge. CIR bed is available today and patient will be transferred there.  Discharge Exam  Blood pressure 154/62, pulse 54, temperature 98.5 F (36.9 C), temperature source Oral, resp. rate 18, height 6\' 1"  (1.854 m), weight 83.6 kg (184 lb 4.9 oz), SpO2 100.00%.  General:  Mental Status:  Alert, oriented, thought content appropriate. Dysarthria. Able to follow 3 step commands without difficulty.  Cranial Nerves:  II: Discs not seen; Visual fields grossly normal, pupils equal, round, reactive to light and accommodation  III,IV, VI: ptosis OS chronic, extra-ocular motions intact bilaterally  V,VII: smile symmetric, facial light touch sensation normal bilaterally  VIII: hearing normal bilaterally  IX,X: gag reflex present  XI: bilateral shoulder shrug  XII: midline tongue extension without atrophy or fasciculations  Motor:  Mild weakness right arm and leg. RUE 5/5 proximal but grip 4/5 and decreased dexterity, RLE 5/5  Tone and bulk:normal tone throughout; no atrophy noted  Sensory: Pinprick and light touch impaired in the right side.  Deep Tendon Reflexes:  Right: Upper Extremity Left: Upper extremity  biceps (C-5 to C-6) 2/4 biceps (C-5 to C-6) 2/4  tricep (C7) 2/4 triceps (C7) 2/4  Brachioradialis (C6) 2/4 Brachioradialis (C6) 2/4  Lower Extremity Lower Extremity  quadriceps (L-2 to L-4) 2/4  quadriceps (L-2 to L-4) 2/4  Achilles (S1) 2/4 Achilles (S1) 2/4  Plantars:  Right: downgoing Left: downgoing  Cerebellar:  Can not perform finger to nose and heel to shin in the right side  Gait:  Not tested.  Discharge Diet  Cardiac thin liquids  Discharge Plan  Disposition:  Transfer to [facility] for ongoing PT, OT and ST  Continue aspirin 325 mg orally every day for secondary stroke prevention   Continue Lipitor  Recommend ongoing risk factor control by Primary Care Physician at time of discharge from inpatient rehabilitation.  Needs OP f/u of parotid mass   Follow-up Sandrea HughsMichael [name], MD in 2 weeks following discharge from rehab.  Follow-up with Dr. Marvel PlanJindong Daruis Merritt, Stroke Clinic in 2 months.  35 minutes were spent preparing discharge.  Annie MainSHARON BIBY, MSN, RN, ANVP-BC, ANP-BC, Lawernce IonGNP-BC [facility] Pager: [phone] 01/05/2014 3:22 PM   Signed I, the attending vascular neurologist, have personally obtained a history, examined the patient, evaluated laboratory data, individually viewed imaging studies, and formulated the assessment and plan of care.  I have made any additions or clarifications directly to the above note and agree with the findings and plan as currently documented.   Marvel PlanJindong Kristeena Meineke, MD PhD Stroke Neurology 01/05/2014 3:35 PM

## 2014-01-05 NOTE — Progress Notes (Signed)
Inpatient Rehabilitation  I have received insurance authorization to admit Mr. Calvin Merritt following his loop recorder insertion. I have discussed with Annie MainSharon Biby and she will write DC orders.  Pt. Has been informed of the plan.  I have discussed with pt's RN Daphne and [facility].  Please call if questions.  Weldon PickingSusan Alliya Marcon PT Inpatient Rehab Admissions Coordinator Cell [phone] Office [phone]

## 2014-01-05 NOTE — PMR Pre-admission (Signed)
PMR Admission Coordinator Pre-Admission Assessment  Patient: Calvin Merritt is an 73 y.o., male MRN: [id number] DOB: 07-04-1940 Height: 6\' 1"  (185.4 cm) Weight: 83.6 kg (184 lb 4.9 oz)              Insurance Information HMO: yes   PPO:       PCP:       IPA:       80/20:       OTHER:   PRIMARY:  [employer] Medicare       Policy#: [id number]      Subscriber:  self CM Name: Kathlen Brunswick      Phone#: [phone]     Fax#: [phone] Pre-Cert#: [id number]; authorized by Katrina for  8/19-8/25/15; update due on 01/11/14      Employer: retired, self employed Benefits:  Phone #: [phone]      Name: Ronni Rumble. Date:  05/20/13     Deduct: none      Out of Pocket Max: $4900      Life Max: none CIR: $345 copay days 1-5     SNF:  $0 days 1-20; $155/day 21-52; $0 day 53 +;  100 days max Outpatient:      Co-Pay: $40 copay based on medical necessity Home Health: 100%      Co-Pay:  none DME:  80%     Co-Pay: 20%  Providers:  In network  Emergency Contact Information Contact Information   Name Relation Home Work Mobile   Weitman,James Son [phone]       Current Medical History  Patient Admitting Diagnosis: left and right MCA infarcts  History of Present Illness:HPI: Calvin Merritt is a 73 y.o. male with history of Hep C, spastic paraparesis, HTN, who was admitted on 12/31/13 with right sided weakness and slurred speech. Patient reported that he had difficulty turning the key or gripping the wheel in his truck for about 30 minutes then fell out of his truck while trying to get out and struck his head. EMS activated and CT head in ED negative for hemorrhage. MRI brain with patchy areas of acute infarction in the left MCA territory, predominantly involving the parietal lobe and small acute right parietal infarct. MRA brain with 4 mm abnormality along the posterior aspect of the proximal right PCA described on CTA remains indeterminate with question of partially thrombosed aneurysm. 2D echo with EF 55-60%,  moderate AVS, pseudonormal left ventricular filling pattern with grade 2 diastolic dysfunction. Carotid dopplers without significant ICA stenosis.  Neurology recommends ASA for embolic stroke of unknown source and TEE recommended for full workup. Family reported that patient with stress as well as depressed mood due to pending divorce and recommended psych follow up but patient has declined this. He continues to be limited by right sided weakness and mild dysarthria has resolved. MD, PT recommending CIR for follow up therapies. Pt. Is awaiting loop recorder insertion 01/05/14 with admit to CIR following.     Total: 7 NIH    Past Medical History  Past Medical History  Diagnosis Date  . Hyperlipidemia   . Hypertension   . Spastic paraparesis   . 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 Acetabular fracture     left  . Diverticulosis     Family History  family history includes Aneurysm in his mother; Breast cancer in his sister; Heart attack in his father; Hypertension in his mother; Stomach cancer in his sister.  Prior Rehab/Hospitalizations:  [facility] in 2009 after L hip  surgery   Current Medications  Current facility-administered medications:0.9 %  sodium chloride infusion, , Intravenous, Continuous, Dayna N Dunn, PA-C, Last Rate: 20 mL/hr at 01/04/14 0834, 500 mL at 01/04/14 0834;  acetaminophen (TYLENOL) suppository 650 mg, 650 mg, Rectal, Q4H PRN, Lunette Stands, MD;  acetaminophen (TYLENOL) tablet 650 mg, 650 mg, Oral, Q4H PRN, Lunette Stands, MD, 650 mg at 01/04/14 2340 aspirin EC tablet 325 mg, 325 mg, Oral, Daily, David L Rinehuls, PA-C, 325 mg at 01/05/14 1013;  atorvastatin (LIPITOR) tablet 10 mg, 10 mg, Oral, q1800, David L Rinehuls, PA-C, 10 mg at 01/04/14 1719;  enoxaparin (LOVENOX) injection 40 mg, 40 mg, Subcutaneous, Q24H, David L Rinehuls, PA-C, 40 mg at 01/04/14 1433;  labetalol (NORMODYNE,TRANDATE) injection 10 mg, 10 mg, Intravenous, Q10 min PRN, Lunette Stands,  MD pantoprazole (PROTONIX) EC tablet 40 mg, 40 mg, Oral, Daily, Delia Heady, MD, 40 mg at 01/05/14 1013;  senna-docusate (Senokot-S) tablet 1 tablet, 1 tablet, Oral, QHS PRN, Lunette Stands, MD  Patients Current Diet: heart healthy  Precautions / Restrictions Precautions Precautions: Fall Restrictions Weight Bearing Restrictions: No   Prior Activity Level Community (5-7x/wk): Pt. reports he goes out daily .  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  He hires the Loss adjuster, chartered and weed eaters but mows lawn and does basic maintenance.     Home Assistive Devices / Equipment Home Assistive Devices/Equipment: Environmental consultant (specify type) (four wheels) Home Equipment: Cane - quad;Walker - 2 wheels  Prior Functional Level Prior Function Level of Independence: Independent Comments: reports he uses a quad cane but is independent  Current Functional Level Cognition  Arousal/Alertness: Awake/alert Overall Cognitive Status: Impaired/Different from baseline Current Attention Level: Sustained Orientation Level: Oriented X4 Safety/Judgement: Decreased awareness of safety General Comments: Pt aware of R sided deficits however remains slightly impulsive. max directional v/c's with sequencing R LE Attention: Selective Selective Attention: Appears intact Memory: Appears intact Awareness: Appears intact Problem Solving: Appears intact Executive Function: Sequencing;Organizing Sequencing: Appears intact Organizing: Appears intact Safety/Judgment: Appears intact    Extremity Assessment (includes Sensation/Coordination)          ADLs  Overall ADL's : Needs assistance/impaired Eating/Feeding: Moderate assistance;Cueing for safety;Cueing for sequencing (up to mod assist (hand over hand) due to ataxia in RUE) Grooming: Moderate assistance;Cueing for safety;Cueing for sequencing (Up to mod assist (hand over hand) due to ataxic movements) Upper Body Bathing: Cueing for  safety;Cueing for sequencing;Minimal assitance Lower Body Bathing: Moderate assistance;Cueing for safety;Cueing for sequencing Upper Body Dressing : Minimal assistance;Cueing for safety;Cueing for sequencing Lower Body Dressing: Moderate assistance;Cueing for safety;Cueing for sequencing Toilet Transfer: Moderate assistance;Stand-pivot;RW;BSC General ADL Comments: Patient requried mod assist for sit<>stand secodnary to therapist assisted with lift & lower. Patient also requires min verbal cues for safety & sequencing. Patient performed stand pivot transfer onto [facility] using RW. Patient with decreased  safety awareness, patient requires up to moderate assistance with ADL tasks at this time, he believed he was able to walk without assistance. +2 needed for functional ambulation for safety at this time.     Mobility  Overal bed mobility:  (pt up in chair upon PT arrival) Bed Mobility: Supine to Sit Supine to sit: Min assist General bed mobility comments: pt received sitting EOB wtih RN staff    Transfers  Overall transfer level: Needs assistance Equipment used: Rolling walker (2 wheeled) Transfers: Sit to/from Stand Sit to Stand: Mod assist Stand pivot transfers: Mod assist General transfer comment: pt required 2  attempts to stand, increased time. v/c's to achieve terminal bilat knee extension    Ambulation / Gait / Stairs / Wheelchair Mobility  Ambulation/Gait Ambulation/Gait assistance: Mod assist;+2 physical assistance Ambulation Distance (Feet): 50 Feet Assistive device: Rolling walker (2 wheeled) Gait Pattern/deviations: Step-to pattern;Ataxic;Narrow base of support (R lateral lean) Gait velocity: slow Gait velocity interpretation: Below normal speed for age/gender General Gait Details: max tactile cues at hips to maintain midline posture, max tactile cues at R hip to facilitate proper sequential gait pattern. Pt required max v/c's to increase step height to clear foot and to achieve  terminal R knee extension during stance phase    Posture / Balance Dynamic Sitting Balance Sitting balance - Comments: posterior and L lateral lean High Level Balance High Level Balance Comments: worked on static standing balance with and without UE support    Special needs/care consideration      Skin large yellowish green bruise                              Location upper medial arm Bowel mgmt:01/04/14 Bladder mgmt: urinal, continent; pt. Reports he has to urinate at least every 2 hours even at night Diabetic mgmt  no     Previous Home Environment Living Arrangements: Alone  Lives With: Alone Available Help at Discharge: Family Type of Home: House Home Layout: Two level;Laundry or work area in basement Home Access: Ramped entrance Foot LockerBathroom Shower/Tub: Health visitorWalk-in shower Bathroom Toilet: Standard Home Care Services: No Additional Comments: Unsure if family willing/able to provide assistance at [employer].  Discharge Living Setting Plans for Discharge Living Setting: Patient's home Type of Home at Discharge: House Discharge Home Layout: Two level;Laundry or work area in basement Discharge Home Access: Ramped entrance Discharge Bathroom Shower/Tub: Garment/textile technologistWalk-in shower Discharge Bathroom Toilet: Standard Discharge Bathroom Accessibility: Yes How Accessible: Accessible via walker Does the patient have any problems obtaining your medications?: No  Social/Family/Support Systems Patient Roles: Parent Contact Information: Pt. has 2 sons and 2 daughters, all local.  He reports one son is currently unemployed and will likely be willing to help pt. as needed.  Later, he reports his son is "sorry" ant that he will not likely be helping him. Pt. reports his wife left hiim 18 months ago and he is now divorced.  He mentions this frequently in the course of conversation.    Goals/Additional Needs Patient/Family Goal for Rehab: mod (I) for PT/OT, n/a for SLP Expected length of stay: 8-11 days Cultural  Considerations: none Dietary Needs: heart healthy diet with thin liquids Equipment Needs: TBD Pt/Family Agrees to Admission and willing to participate: Yes Program Orientation Provided & Reviewed with Pt/Caregiver Including Roles  & Responsibilities: Yes   Decrease burden of Care through IP rehab admission: no   Possible need for SNF placement upon discharge:  Not anticipated   Patient Condition: This patient's medical and functional status has changed since the consult dated:  01/03/14  in which the Rehabilitation Physician determined and documented that the patient's condition is appropriate for intensive rehabilitative care in an inpatient rehabilitation facility. See "History of Present Illness" (above) for medical update. Functional changes are: + 2 mod assist for gait 50', ataxic, mod assist for transfers.. Patient's medical and functional status update has been discussed with the Rehabilitation physician and patient remains appropriate for inpatient rehabilitation. Will admit to inpatient rehab today.  Preadmission Screen Completed By:  Brendia SacksSusan BlankenshipPT , 01/05/2014 3:25 PM ______________________________________________________________________  Discussed status with Dr.  Riley Kill on 01/05/14 at  1527  and received telephone approval for admission today.  Admission Coordinator:  Kathleen Argue  Time 1527 Dorna Bloom 01/05/14

## 2014-01-05 NOTE — Interval H&P Note (Signed)
History and Physical Interval Note:  01/05/2014 6:18 PM  Calvin Merritt Wahlstrom  has presented today for surgery, with the diagnosis of syncope  The various methods of treatment have been discussed with the patient and family. After consideration of risks, benefits and other options for treatment, the patient has consented to  Procedure(s): LOOP RECORDER IMPLANT (N/A) as a surgical intervention .  The patient's history has been reviewed, patient examined, no change in status, stable for surgery.  I have reviewed the patient's chart and labs.  Questions were answered to the patient's satisfaction.     Sherryl MangesSteven Klein

## 2014-01-05 NOTE — H&P (View-Only) (Signed)
   ELECTROPHYSIOLOGY CONSULT NOTE  Patient ID: Calvin Merritt MRN: [id number], DOB/AGE: 12/26/1940   Admit date: 12/31/2013 Date of Consult: 01/05/2014  Primary Physician: Michael [name], MD Primary Cardiologist: new to [facility] Reason for Consultation: Cryptogenic stroke; recommendations regarding Implantable Loop Recorder  History of Present Illness Calvin Merritt was admitted on 12/31/2013 with right hemiparesis and dysarthria.  Imaging demonstrated acute infarct in left MCA territory predominately involving parietal lobe.  He has undergone workup for stroke including echocardiogram and carotid dopplers.  The patient has been monitored on telemetry which has demonstrated sinus rhythm with runs of atrial tach.  TEE yesterday demonstrated normal LV size with mild LV hypertrophy, moderate AS, small PFO with negative LE venous dopplers.   Echocardiogram this admission demonstrated EF 55-60%, grade 2 diastolic dysfunction, LA 49.  Lab work is reviewed.  Prior to admission, the patient denies chest pain, shortness of breath, dizziness, palpitations, or syncope.  They are recovering from their stroke with plans to go to inpatient rehab at discharge.  EP has been asked to evaluate for placement of an implantable loop recorder to monitor for atrial fibrillation.  ROS is negative except as outlined above.    Past Medical History  Diagnosis Date  . Hyperlipidemia   . Hypertension   . Spastic paraparesis   . 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 Acetabular fracture     left  . Diverticulosis   . PFO (patent foramen ovale) 01/05/2014  . 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 Aortic valve stenosis, moderate 01/05/2014     Surgical History:  Past Surgical History  Procedure Laterality Date  . Nephrectomy Right 11/05  . Hip surgery  05/11    L-acetabular fracture  . Tee without  cardioversion N/A 01/04/2014    Procedure: TRANSESOPHAGEAL ECHOCARDIOGRAM (TEE);  Surgeon: Dalton S McLean, MD;  Location: [facility];  Service: Cardiovascular;  Laterality: N/A;     Prescriptions prior to admission  Medication Sig Dispense Refill  . amLODipine (NORVASC) 10 MG tablet Take 10 mg by mouth daily.      . aspirin EC 81 MG tablet Take 81 mg by mouth daily.      . benazepril (LOTENSIN) 20 MG tablet Take 20 mg by mouth daily.      . furosemide (LASIX) 20 MG tablet Take 20 mg by mouth daily.      . Multiple Vitamin (MULTIVITAMIN) tablet Take 1 tablet by mouth daily.        Inpatient Medications:  . aspirin EC  325 mg Oral Daily  . atorvastatin  10 mg Oral q1800  . enoxaparin (LOVENOX) injection  40 mg Subcutaneous Q24H  . pantoprazole  40 mg Oral Daily    Allergies: No Known Allergies  History   Social History  . Marital Status: Married    Spouse Name: N/A    Number of Children: N/A  . Years of Education: N/A   Occupational History  . Retired [employer]    Social History Main Topics  . Smoking status: Never Smoker   . Smokeless tobacco: Never Used  . Alcohol Use: No  . Drug Use: No  . Sexual Activity: Not on file   Other Topics Concern  . Not on file   Social History Narrative  . No narrative on file     Family History  Problem Relation   Age of Onset  . Stomach cancer Sister   . Breast cancer Sister   . Heart attack Father   . Hypertension Mother   . Aneurysm Mother     BP 154/62  Pulse 54  Temp(Src) 98.5 F (36.9 C) (Oral)  Resp 18  Ht 6' 1" (1.854 m)  Wt 184 lb 4.9 oz (83.6 kg)  BMI 24.32 kg/m2  SpO2 100%  Physical Exam: Well developed and nourished in no acute distress HENT normal Neck supple with JVP-flat Carotids without bruits Clear Regular rate and rhythm, 2/6 murmur Abd-soft with active BS No Clubbing cyanosis edema Skin-warm and dry A & Oriented  Grossly normal sensory and motor function some dysarthria      Labs:     Lab Results  Component Value Date   WBC 10.0 01/02/2014   HGB 13.1 01/02/2014   HCT 39.2 01/02/2014   MCV 88.9 01/02/2014   PLT 233 01/02/2014    Recent Labs Lab 12/31/13 1655  01/02/14 0730  NA 142  < > 141  K 3.5*  < > 3.9  CL 104  < > 108  CO2 21  --  24  BUN 16  < > 13  CREATININE 1.29  < > 1.19  CALCIUM 9.6  --  9.0  PROT 7.3  --   --   BILITOT 0.7  --   --   ALKPHOS 68  --   --   ALT 12  --   --   AST 16  --   --   GLUCOSE 176*  < > 114*  < > = values in this interval not displayed. No results found for this basename: CKTOTAL,  CKMB,  CKMBINDEX,  TROPONINI    Radiology/Studies: Ct Angio Head W/cm &/or Wo Cm 12/31/2013   CLINICAL DATA:  Code stroke. Sudden onset right extremity weakness causing fall. Slurred speech.  EXAM: CT ANGIOGRAPHY HEAD AND NECK  TECHNIQUE: Multidetector CT imaging of the head and neck was performed using the standard protocol during bolus administration of intravenous contrast. Multiplanar CT image reconstructions and MIPs were obtained to evaluate the vascular anatomy. Carotid stenosis measurements (when applicable) are obtained utilizing NASCET criteria, using the distal internal carotid diameter as the denominator.  CONTRAST:  50mL OMNIPAQUE IOHEXOL 350 MG/ML SOLN  COMPARISON:  Head CT 12/31/2013.  Cervical spine MRI 03/28/2004.  FINDINGS: CTA HEAD FINDINGS  Postcontrast head CT images again demonstrate chronic small vessel ischemic disease and mild to moderate cerebral atrophy without evidence of acute intracranial hemorrhage, mass, midline shift, or abnormal enhancement. Orbits are unremarkable. Paranasal sinuses and mastoid air cells are clear.  Vertebral arteries are patent to the basilar, although the right is hypoplastic. PICA origins are patent. AICA and SCA origins are patent. Basilar artery is patent without stenosis. There are small bilateral posterior communicating arteries, right larger than left. The proximal right P2 segment courses anteriorly,  and immediately posterior to it is a 4 mm hyperattenuating focus which is less dense than the adjacent artery (series 402, image 244). PCAs are otherwise unremarkable.  Internal carotid arteries are patent from skullbase to carotid termini. Mild-to-moderate atherosclerotic calcification is present involving the carotid siphons. ACAs and MCAs are unremarkable.  Review of the MIP images confirms the above findings.  CTA NECK FINDINGS  Incidental note is made of a common origin of the brachiocephalic and left common carotid arteries, a normal variant. Mild atherosclerotic calcification is noted of the aortic arch and proximal left subclavian artery. Evaluation   of the proximal right common carotid artery is partially limited by motion. Mid and distal right common carotid artery is unremarkable. Mild calcified and noncalcified plaque is present at both carotid bifurcations without stenosis. Vertebral arteries are patent with the left being dominant in the right being diffusely hypoplastic.  There is an oval, well-circumscribed mass in the right parotid gland measuring 15 x 11 mm. This was partially imaged on 2005 MRI and appears unchanged in short axis measurement. Multiple subcentimeter nodules are present in both parotid glands, which may reflect small lymph nodes. Small lymph nodes are also noted in the lower neck. Diffuse bridging anterior osteophytosis is present throughout the cervical spine. Moderate to severe multilevel facet arthrosis is also noted.  Review of the MIP images confirms the above findings.  IMPRESSION: 1. No major intracranial arterial occlusion or flow limiting proximal stenosis. 2. 4 mm slightly ill-defined hyperattenuating focus along the posterior margin of the proximal right PCA, of uncertain etiology. This may reflect a prominent venous structure. It does not clearly communicate with the adjacent PCA, although a partially thrombosed aneurysm cannot be completely excluded but is felt unlikely.  Follow-up CTA could be performed in 6-12 months to assess stability. 3. Mild cervical carotid atherosclerosis without stenosis. 4. 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 These results were called by telephone at the time of interpretation on 12/31/2013 at 7:28 pm to Dr. OSVALDO CAMILO , who verbally acknowledged these results.   Electronically Signed   By: Allen  Grady   On: 12/31/2013 19:28   Ct Head Wo Contrast 12/31/2013   CLINICAL DATA:  Code stroke.  Syncopal episode.  EXAM: CT HEAD WITHOUT CONTRAST  TECHNIQUE: Contiguous axial images were obtained from the base of the skull through the vertex without intravenous contrast.  COMPARISON:  None.  FINDINGS: Prominent extra-axial spaces. The ventricles and sulci are unremarkable. No evidence for hemorrhage, mass effect or mass lesion. Periventricular white matter hypodensity compatible with chronic small vessel ischemic change. There is a more focal area of decreased attenuation within the right parietal white matter (image 23; series 2) which may represent an age indeterminate infarct. Calvarium is intact. Paranasal sinuses are unremarkable. Mastoid air cells are well aerated.  IMPRESSION: No intracranial hemorrhage.  Chronic small vessel ischemic change. Additionally within the right parietal white matter is a more focal area of decreased attenuation which may represent an age-indeterminate infarct.  Critical Value/emergent results were called by telephone at the time of interpretation on 12/31/2013 at 5:19 pm to Dr. Camilo, who verbally acknowledged these results.   Electronically Signed   By: Drew  Davis M.D.   On: 12/31/2013 17:24   12-lead ECG sinus rhythm, rate 97, normal intervals   Telemetry sinus brady/sinus rhythm    Assessment and Plan  Crytogenic stroke  Aortic Stenosis  Moderate  LAE  Moderate (49/2.36)  For LINQ insertion for cryptogenic  stroke Reviewed with pts who expresses understanding   

## 2014-01-05 NOTE — CV Procedure (Signed)
Pre op Dx Cryptogenic Stroke  Post op Dx same  Procedure  Loop Recorder implantation  After routine prep and drape of the left parasternal area, a small incision was created. A [employer]onic LINQ Reveal Loop Recorder  Serial Number  [id number] was inserted.    SteriStrip dressing was  applied.  The patient tolerated the procedure without apparent complication.

## 2014-01-05 NOTE — H&P (Signed)
Physical Medicine and Rehabilitation Admission H&P    Chief Complaint  Patient presents with  . Right sided weakness and slurred speech    HPI:  Calvin Merritt is a 73 y.o. male with history of Hep C, familial spastic paraparesis with gait disorder, HTN, who was admitted on 12/31/13 with right sided weakness and slurred speech. Patient reported that he had difficulty turning the key or gripping the wheel in his truck for about 30 minutes then fell out of his truck while trying to get out and struck his head. EMS activated and CT head in ED negative for hemorrhage. MRI brain with patchy areas of acute infarction in the left MCA territory, predominantly involving the parietal lobe and small acute right parietal infarct. MRA brain with 4 mm abnormality along the posterior aspect of the proximal right PCA described on CTA remains indeterminate with question of partially thrombosed aneurysm. 2D echo with EF 55-60%, moderate AVS, pseudonormal left ventricular filling pattern with grade 2 diastolic dysfunction. Carotid dopplers without significant ICA stenosis.   Neurology recommends ASA for embolic stroke of unknown source. TEE with severely calcified aortic valve with moderate AS, EF 55-60%, small PFO, bubbles cross with valsalva and grade III plaque in descending thoracic aorta.  BLE dopplers without DVT.   Family reported that patient with stress as well as depressed mood due to pending divorce and recommended psych follow up but patient has declined this. Loop recorder to be placed today.  He has had improvement in RUE strength but continues to have ataxia RUE/RLE, decreased balance as well as poor safety awareness. MD and Rehab team recommended CIR and patient admitted today for follow up therapies.   Review of Systems  HENT: Negative for hearing loss.   Eyes: Negative for blurred vision and double vision.  Respiratory: Negative for cough and shortness of breath.   Cardiovascular: Negative for  chest pain and palpitations.  Gastrointestinal: Negative for heartburn and nausea.  Musculoskeletal: Positive for falls. Negative for myalgias.  Neurological: Positive for focal weakness. Negative for dizziness, tingling, weakness and headaches.  Psychiatric/Behavioral: Positive for depression. The patient is nervous/anxious.     Past Medical History  Diagnosis Date  . Hyperlipidemia   . Hypertension   . Spastic paraparesis   . 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 Acetabular fracture     left  . Diverticulosis     Past Surgical History  Procedure Laterality Date  . Nephrectomy Right 11/05  . Hip surgery  05/11    L-acetabular fracture  . Tee without cardioversion N/A 01/04/2014    Procedure: TRANSESOPHAGEAL ECHOCARDIOGRAM (TEE);  Surgeon: Laurey Morale, MD;  Location: [facility] ENDOSCOPY;  Service: Cardiovascular;  Laterality: N/A;    Family History  Problem Relation Age of Onset  . Stomach cancer Sister   . Breast cancer Sister   . Heart attack Father   . Hypertension Mother   . Aneurysm Mother     Social History:  Divorced. Lives alone. Owns and manages a cemetery. He reports that he has never smoked. He has never used smokeless tobacco. He reports that he does not drink alcohol or use illicit drugs.   Allergies: No Known Allergies   Medications Prior to Admission  Medication Sig Dispense Refill  . amLODipine (NORVASC) 10 MG tablet Take 10 mg by mouth daily.      Marland Kitchen aspirin EC 81 MG tablet Take 81 mg by mouth daily.      . benazepril (LOTENSIN)  20 MG tablet Take 20 mg by mouth daily.      . furosemide (LASIX) 20 MG tablet Take 20 mg by mouth daily.      . Multiple Vitamin (MULTIVITAMIN) tablet Take 1 tablet by mouth daily.        Home: Home Living Family/patient expects to be discharged to:: Private residence Living Arrangements: Alone Available Help at Discharge: Family Type of Home: House Home Access: Ramped entrance Home Layout: Two level;Laundry or work area  in [employer] Equipment: Gilmer Mor - Manufacturing systems engineer - 2 wheels Additional Comments: Unsure if family willing/able to provide assistance at dc.  Lives With: Alone   Functional History: Prior Function Level of Independence: Independent Comments: reports he uses a quad cane but is independent  Functional Status:  Mobility: Bed Mobility Overal bed mobility:  (pt up in chair upon PT arrival) Bed Mobility: Supine to Sit Supine to sit: Min assist General bed mobility comments: pt received sitting EOB wtih RN staff Transfers Overall transfer level: Needs assistance Equipment used: Rolling walker (2 wheeled) Transfers: Sit to/from Stand Sit to Stand: Mod assist Stand pivot transfers: Mod assist General transfer comment: pt required 2 attempts to stand, increased time. v/c's to achieve terminal bilat knee extension Ambulation/Gait Ambulation/Gait assistance: Mod assist;+2 physical assistance Ambulation Distance (Feet): 50 Feet Assistive device: Rolling walker (2 wheeled) Gait Pattern/deviations: Step-to pattern;Ataxic;Narrow base of support (R lateral lean) Gait velocity: slow Gait velocity interpretation: Below normal speed for age/gender General Gait Details: max tactile cues at hips to maintain midline posture, max tactile cues at R hip to facilitate proper sequential gait pattern. Pt required max v/c's to increase step height to clear foot and to achieve terminal R knee extension during stance phase    ADL: ADL Overall ADL's : Needs assistance/impaired Eating/Feeding: Moderate assistance;Cueing for safety;Cueing for sequencing (up to mod assist (hand over hand) due to ataxia in RUE) Grooming: Moderate assistance;Cueing for safety;Cueing for sequencing (Up to mod assist (hand over hand) due to ataxic movements) Upper Body Bathing: Cueing for safety;Cueing for sequencing;Minimal assitance Lower Body Bathing: Moderate assistance;Cueing for safety;Cueing for sequencing Upper Body Dressing :  Minimal assistance;Cueing for safety;Cueing for sequencing Lower Body Dressing: Moderate assistance;Cueing for safety;Cueing for sequencing Toilet Transfer: Moderate assistance;Stand-pivot;RW;BSC General ADL Comments: Patient requried mod assist for sit<>stand secodnary to therapist assisted with lift & lower. Patient also requires min verbal cues for safety & sequencing. Patient performed stand pivot transfer onto [facility] using RW. Patient with decreased  safety awareness, patient requires up to moderate assistance with ADL tasks at this time, he believed he was able to walk without assistance. +2 needed for functional ambulation for safety at this time.   Cognition: Cognition Overall Cognitive Status: Impaired/Different from baseline Arousal/Alertness: Awake/alert Orientation Level: Oriented X4 Attention: Selective Selective Attention: Appears intact Memory: Appears intact Awareness: Appears intact Problem Solving: Appears intact Executive Function: Sequencing;Organizing Sequencing: Appears intact Organizing: Appears intact Safety/Judgment: Appears intact Cognition Arousal/Alertness: Awake/alert Behavior During Therapy: WFL for tasks assessed/performed Overall Cognitive Status: Impaired/Different from baseline Area of Impairment: Safety/judgement;Problem solving Current Attention Level: Sustained Memory: Decreased short-term memory Safety/Judgement: Decreased awareness of safety Awareness: Intellectual Problem Solving: Slow processing;Requires verbal cues;Requires tactile cues;Difficulty sequencing General Comments: Pt aware of R sided deficits however remains slightly impulsive. max directional v/c's with sequencing R LE  Physical Exam: Blood pressure 162/69, pulse 51, temperature 98.8 F (37.1 C), temperature source Oral, resp. rate 20, height 6\' 1"  (1.854 m), weight 83.6 kg (184 lb 4.9 oz), SpO2 98.00%. Physical  Exam Constitutional: He appears well-developed and well-nourished.    HENT: oral mucosa moist, dentition fair Head: Normocephalic and atraumatic.  Eyes: Conjunctivae are normal. Pupils are equal, round, and reactive to light. Left ptosis(birth injury)  Neck: No JVD present. No tracheal deviation present. No thyromegaly present.  Cardiovascular: Normal rate. Systolic murmur, reg rhythm  Respiratory: No respiratory distress. No wheezes or rales GI: He exhibits no distension. Bowels sounds + Musculoskeletal: Normal range of motion. Large ecchymotic area right upper arm.  Neurological:  Alert, still a little impulsive, fair insight and awareness. Right upper limb > Right lower limb ataxia with decreased fine motor coordination. +PD RUE. No gross sensory differences to light touch/pain right to left although has decreased position sense on the right. Strength 4- to 4/5 RUE prox to distal. LUE 5-/5. Lower ext's 3+ HF and 4/5 KE,  And 4/5 ADF/APF.  Psychiatric:  Pleasant and cooperative  No results found for this or any previous visit (from the past 48 hour(s)). No results found.     Medical Problem List and Plan: 1. 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  DVT Prophylaxis/Anticoagulation: Pharmaceutical: Lovenox 3. Pain Management: Tylenol prn.  4. Depression/Mood: Willing to start antidepressant. Willing to talk to psychologist about current impairments  as well as issues regarding ex-wife.  5. Neuropsych: This patient is capable of making decisions on his own behalf. 6. Skin/Wound Care: Routine pressure relief measures. Maintain adequate hydration and nutritional status.  7. Dyslipidemia: On lipitor     Post Admission Physician Evaluation: 1. Functional deficits secondary  to bilateral embolic infarcts, right MCA > Left parietal. 2. Patient is admitted to receive collaborative, interdisciplinary care between the physiatrist, rehab nursing staff, and therapy team. 3. Patient's level of  medical complexity and substantial therapy needs in context of that medical necessity cannot be provided at a lesser intensity of care such as a SNF. 4. Patient has experienced substantial functional loss from his/her baseline which was documented above under the "Functional History" and "Functional Status" headings.  Judging by the patient's diagnosis, physical exam, and functional history, the patient has potential for functional progress which will result in measurable gains while on inpatient rehab.  These gains will be of substantial and practical use upon discharge  in facilitating mobility and self-care at the household level. 5. Physiatrist will provide 24 hour management of medical needs as well as oversight of the therapy plan/treatment and provide guidance as appropriate regarding the interaction of the two. 6. 24 hour rehab nursing will assist with bladder management, bowel management, safety, skin/wound care, disease management, medication administration, pain management and patient education  and help integrate therapy concepts, techniques,education, etc. 7. PT will assess and treat for/with: Lower extremity strength, range of motion, stamina, balance, functional mobility, safety, adaptive techniques and equipment, NMR, cognitive perceptual awareness, stroke education, community reintegration.   Goals are: supervision. 8. OT will assess and treat for/with: ADL's, functional mobility, safety, upper extremity strength, adaptive techniques and equipment, NMR, cognitive perceptual awareness, leisure awareness, family ed.   Goals are: supervision. 9. SLP will assess and treat for/with: cognition, communication.  Goals are: supervision. 10. Case Management and Social Worker will assess and treat for psychological issues and discharge planning. 11. Team conference will be held weekly to assess progress toward goals and to determine barriers to discharge. 12. Patient will receive at least 3 hours of  therapy per day at least 5 days per week. 13. ELOS: 12-14  days       14. Prognosis:  excellent     Ranelle Oyster, MD, [facility] Physical Medicine & Rehabilitation   01/05/2014

## 2014-01-05 NOTE — Progress Notes (Signed)
Calvin Merritt Rehab Admission Coordinator Signed Physical Medicine and Rehabilitation PMR Pre-admission Service date: 01/05/2014 11:40 AM  Related encounter: Admission (Current) from 12/31/2013 in [facility]   PMR Admission Coordinator Pre-Admission Assessment  Patient: Calvin Merritt is an 73 y.o., male  MRN: [id number]  DOB: 03-Jan-1941  Height: 6\' 1"  (185.4 cm)  Weight: 83.6 kg (184 lb 4.9 oz)  Insurance Information  HMO: yes PPO: PCP: IPA: 80/20: OTHER:  PRIMARY: A[employer] Medicare Policy#: [id number] Subscriber: self  CM Name: Kathlen Brunswick Phone#: [phone] Fax#: [phone]  Pre-Cert#: [id number]; authorized by Katrina for 8/19-8/25/15; update due on 01/11/14 Employer: retired, self employed  Benefits: Phone #: [phone] Name: Ronni Rumble. Date: 05/20/13 Deduct: none Out of Pocket Max: $4900 Life Max: none  CIR: $345 copay days 1-5 SNF: $0 days 1-20; $155/day 21-52; $0 day 53 +; 100 days max  Outpatient: Co-Pay: $40 copay based on medical necessity  Home Health: 100% Co-Pay: none  DME: 80% Co-Pay: 20%  Providers: In network  Emergency Contact Information    Contact Information     Name  Relation  Home  Work  Mobile     [place],James  Son  [phone]          Current Medical History  Patient Admitting Diagnosis: left and right MCA infarcts  History of Present Illness:HPI: Calvin Merritt is a 73 y.o. male with history of Hep C, spastic paraparesis, HTN, who was admitted on 12/31/13 with right sided weakness and slurred speech. Patient reported that he had difficulty turning the key or gripping the wheel in his truck for about 30 minutes then fell out of his truck while trying to get out and struck his head. EMS activated and CT head in ED negative for hemorrhage. MRI brain with patchy areas of acute infarction in the left MCA territory, predominantly involving the parietal lobe and small acute right parietal infarct. MRA brain with 4 mm abnormality  along the posterior aspect of the proximal right PCA described on CTA remains indeterminate with question of partially thrombosed aneurysm. 2D echo with EF 55-60%, moderate AVS, pseudonormal left ventricular filling pattern with grade 2 diastolic dysfunction. Carotid dopplers without significant ICA stenosis.  Neurology recommends ASA for embolic stroke of unknown source and TEE recommended for full workup. Family reported that patient with stress as well as depressed mood due to pending divorce and recommended psych follow up but patient has declined this. He continues to be limited by right sided weakness and mild dysarthria has resolved. MD, PT recommending CIR for follow up therapies. Pt. Is awaiting loop recorder insertion 01/05/14 with admit to CIR following.  Total: 7 NIH   Past Medical History    Past Medical History    Diagnosis  Date    .  Hyperlipidemia     .  Hypertension     .  Spastic paraparesis     .  Hepatitis C antibody test positive     .  Acetabular fracture       left    .  Diverticulosis      Family History  family history includes Aneurysm in his mother; Breast cancer in his sister; Heart attack in his father; Hypertension in his mother; Stomach cancer in his sister.  Prior Rehab/Hospitalizations: [facility] in 2009 after L hip surgery  Current Medications  Current facility-administered medications:0.9 % sodium chloride infusion, , Intravenous, Continuous, Dayna N Dunn, PA-C, Last Rate: 20 mL/hr at 01/04/14 0834, 500  mL at 01/04/14 0834; acetaminophen (TYLENOL) suppository 650 mg, 650 mg, Rectal, Q4H PRN, Lunette Stands, MD; acetaminophen (TYLENOL) tablet 650 mg, 650 mg, Oral, Q4H PRN, Lunette Stands, MD, 650 mg at 01/04/14 2340  aspirin EC tablet 325 mg, 325 mg, Oral, Daily, David L Rinehuls, PA-C, 325 mg at 01/05/14 1013; atorvastatin (LIPITOR) tablet 10 mg, 10 mg, Oral, q1800, David L Rinehuls, PA-C, 10 mg at 01/04/14 1719; enoxaparin (LOVENOX) injection 40 mg, 40  mg, Subcutaneous, Q24H, David L Rinehuls, PA-C, 40 mg at 01/04/14 1433; labetalol (NORMODYNE,TRANDATE) injection 10 mg, 10 mg, Intravenous, Q10 min PRN, Lunette Stands, MD  pantoprazole (PROTONIX) EC tablet 40 mg, 40 mg, Oral, Daily, Delia Heady, MD, 40 mg at 01/05/14 1013; senna-docusate (Senokot-S) tablet 1 tablet, 1 tablet, Oral, QHS PRN, Lunette Stands, MD  Patients Current Diet: heart healthy  Precautions / Restrictions  Precautions  Precautions: Fall  Restrictions  Weight Bearing Restrictions: No  Prior Activity Level  Community (5-7x/wk): Pt. reports he goes out daily . 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  Home Assistive Devices / Equipment  Home Assistive Devices/Equipment: Environmental consultant (specify type) (four wheels)  Home Equipment: Cane - quad;Walker - 2 wheels  Prior Functional Level  Prior Function  Level of Independence: Independent  Comments: reports he uses a quad cane but is independent  Current Functional Level    Cognition  Arousal/Alertness: Awake/alert  Overall Cognitive Status: Impaired/Different from baseline  Current Attention Level: Sustained  Orientation Level: Oriented X4  Safety/Judgement: Decreased awareness of safety  General Comments: Pt aware of R sided deficits however remains slightly impulsive. max directional v/c's with sequencing R LE  Attention: Selective  Selective Attention: Appears intact  Memory: Appears intact  Awareness: Appears intact  Problem Solving: Appears intact  Executive Function: Sequencing;Organizing  Sequencing: Appears intact  Organizing: Appears intact  Safety/Judgment: Appears intact    Extremity Assessment  (includes Sensation/Coordination)      ADLs  Overall ADL's : Needs assistance/impaired  Eating/Feeding: Moderate assistance;Cueing for safety;Cueing for sequencing (up to mod assist (hand over hand) due to ataxia  in RUE)  Grooming: Moderate assistance;Cueing for safety;Cueing for sequencing (Up to mod assist (hand over hand) due to ataxic movements)  Upper Body Bathing: Cueing for safety;Cueing for sequencing;Minimal assitance  Lower Body Bathing: Moderate assistance;Cueing for safety;Cueing for sequencing  Upper Body Dressing : Minimal assistance;Cueing for safety;Cueing for sequencing  Lower Body Dressing: Moderate assistance;Cueing for safety;Cueing for sequencing  Toilet Transfer: Moderate assistance;Stand-pivot;RW;BSC  General ADL Comments: Patient requried mod assist for sit<>stand secodnary to therapist assisted with lift & lower. Patient also requires min verbal cues for safety & sequencing. Patient performed stand pivot transfer onto [facility] using RW. Patient with decreased safety awareness, patient requires up to moderate assistance with ADL tasks at this time, he believed he was able to walk without assistance. +2 needed for functional ambulation for safety at this time.    Mobility  Overal bed mobility: (pt up in chair upon PT arrival)  Bed Mobility: Supine to Sit  Supine to sit: Min assist  General bed mobility comments: pt received sitting EOB wtih RN staff    Transfers  Overall transfer level: Needs assistance  Equipment used: Rolling walker (2 wheeled)  Transfers: Sit to/from Stand  Sit to Stand: Mod assist  Stand pivot transfers: Mod assist  General transfer comment: pt required 2 attempts to  stand, increased time. v/c's to achieve terminal bilat knee extension    Ambulation / Gait / Stairs / Wheelchair Mobility  Ambulation/Gait  Ambulation/Gait assistance: Mod assist;+2 physical assistance  Ambulation Distance (Feet): 50 Feet  Assistive device: Rolling walker (2 wheeled)  Gait Pattern/deviations: Step-to pattern;Ataxic;Narrow base of support (R lateral lean)  Gait velocity: slow  Gait velocity interpretation: Below normal speed for age/gender  General Gait Details: max tactile cues at  hips to maintain midline posture, max tactile cues at R hip to facilitate proper sequential gait pattern. Pt required max v/c's to increase step height to clear foot and to achieve terminal R knee extension during stance phase    Posture / Balance  Dynamic Sitting Balance  Sitting balance - Comments: posterior and L lateral lean  High Level Balance  High Level Balance Comments: worked on static standing balance with and without UE support    Special needs/care consideration  Skin large yellowish green bruise Location upper medial arm  Bowel mgmt:01/04/14  Bladder mgmt: urinal, continent; pt. Reports he has to urinate at least every 2 hours even at night  Diabetic mgmt no    Previous Home Environment  Living Arrangements: Alone  Lives With: Alone  Available Help at Discharge: Family  Type of Home: House  Home Layout: Two level;Laundry or work area in basement  Home Access: Ramped entrance  [employer] Shower/Tub: Pension scheme manager: Standard  Home Care Services: No  Additional Comments: Unsure if family willing/able to provide assistance at [employer].  Discharge Living Setting  Plans for Discharge Living Setting: Patient's home  Type of Home at Discharge: House  Discharge Home Layout: Two level;Laundry or work area in basement  Discharge Home Access: Ramped entrance  Discharge Bathroom Shower/Tub: Soil scientist Toilet: Standard  Discharge Bathroom Accessibility: Yes  How Accessible: Accessible via walker  Does the patient have any problems obtaining your medications?: No  Social/Family/Support Systems  Patient Roles: Parent  Contact Information: Pt. has 2 sons and 2 daughters, all local. He reports one son is currently unemployed and will likely be willing to help pt. as needed. Later, he reports his son is "sorry" ant that he will not likely be helping him. Pt. reports his wife left hiim 18 months ago and he is now divorced. He mentions this frequently in the  course of conversation.  Goals/Additional Needs  Patient/Family Goal for Rehab: mod (I) for PT/OT, n/a for SLP  Expected length of stay: 8-11 days  Cultural Considerations: none  Dietary Needs: heart healthy diet with thin liquids  Equipment Needs: TBD  Pt/Family Agrees to Admission and willing to participate: Yes  Program Orientation Provided & Reviewed with Pt/Caregiver Including Roles & Responsibilities: Yes  Decrease burden of Care through IP rehab admission: no  Possible need for SNF placement upon discharge: Not anticipated  Patient Condition: This patient's medical and functional status has changed since the consult dated: 01/03/14 in which the Rehabilitation Physician determined and documented that the patient's condition is appropriate for intensive rehabilitative care in an inpatient rehabilitation facility. See "History of Present Illness" (above) for medical update. Functional changes are: + 2 mod assist for gait 50', ataxic, mod assist for transfers.. Patient's medical and functional status update has been discussed with the Rehabilitation physician and patient remains appropriate for inpatient rehabilitation. Will admit to inpatient rehab today.  Preadmission Screen Completed By: Brendia Sacks , 01/05/2014 3:25 PM  ______________________________________________________________________  Discussed status with Dr. Riley Kill on 01/05/14 at 1527  and received telephone approval for admission today.  Admission Coordinator: Kathleen ArgueSusan Gwendalynn Eckstrom,PT Time [id number] Dorna Bloom/Date 01/05/14    Cosigned by: Ranelle OysterZachary T Swartz, MD [01/05/2014 3:42 PM]

## 2014-01-05 NOTE — Progress Notes (Signed)
Patient arrived on Rehabilitation unit (4 west) at 2030 via bed with nurse. Vital signs stable  T 98.3 P 58 R 16 Bp 160/66 O2 saturations 94%. Loop recorder dressing intact with small amount bright red blood to left chest. No signs or symptoms of distress. adm

## 2014-01-05 NOTE — Interval H&P Note (Signed)
Calvin Merritt was admitted today to Inpatient Rehabilitation with the diagnosis of embolic CVA.  The patient's history has been reviewed, patient examined, and there is no change in status.  Patient continues to be appropriate for intensive inpatient rehabilitation.  I have reviewed the patient's chart and labs.  Questions were answered to the patient's satisfaction.  SWARTZ,ZACHARY T 01/05/2014, 8:46 PM

## 2014-01-06 ENCOUNTER — Inpatient Hospital Stay (HOSPITAL_COMMUNITY): Payer: Medicare Other

## 2014-01-06 ENCOUNTER — Inpatient Hospital Stay (HOSPITAL_COMMUNITY): Payer: Medicare Other | Admitting: Occupational Therapy

## 2014-01-06 DIAGNOSIS — F329 Major depressive disorder, single episode, unspecified: Secondary | ICD-10-CM

## 2014-01-06 DIAGNOSIS — F3289 Other specified depressive episodes: Secondary | ICD-10-CM

## 2014-01-06 DIAGNOSIS — Z5189 Encounter for other specified aftercare: Secondary | ICD-10-CM

## 2014-01-06 DIAGNOSIS — I6340 Cerebral infarction due to embolism of unspecified cerebral artery: Secondary | ICD-10-CM

## 2014-01-06 LAB — CBC WITH DIFFERENTIAL/PLATELET
BASOS ABS: 0 10*3/uL (ref 0.0–0.1)
BASOS PCT: 0 % (ref 0–1)
Eosinophils Absolute: 0.4 10*3/uL (ref 0.0–0.7)
Eosinophils Relative: 3 % (ref 0–5)
HEMATOCRIT: 43.5 % (ref 39.0–52.0)
Hemoglobin: 14.8 g/dL (ref 13.0–17.0)
Lymphocytes Relative: 37 % (ref 12–46)
Lymphs Abs: 4.5 10*3/uL — ABNORMAL HIGH (ref 0.7–4.0)
MCH: 29.7 pg (ref 26.0–34.0)
MCHC: 34 g/dL (ref 30.0–36.0)
MCV: 87.3 fL (ref 78.0–100.0)
MONO ABS: 1 10*3/uL (ref 0.1–1.0)
MONOS PCT: 8 % (ref 3–12)
Neutro Abs: 6.1 10*3/uL (ref 1.7–7.7)
Neutrophils Relative %: 52 % (ref 43–77)
Platelets: 279 10*3/uL (ref 150–400)
RBC: 4.98 MIL/uL (ref 4.22–5.81)
RDW: 13.2 % (ref 11.5–15.5)
WBC: 12.1 10*3/uL — ABNORMAL HIGH (ref 4.0–10.5)

## 2014-01-06 LAB — COMPREHENSIVE METABOLIC PANEL
ALBUMIN: 3.2 g/dL — AB (ref 3.5–5.2)
ALT: 14 U/L (ref 0–53)
ANION GAP: 11 (ref 5–15)
AST: 16 U/L (ref 0–37)
Alkaline Phosphatase: 57 U/L (ref 39–117)
BUN: 25 mg/dL — AB (ref 6–23)
CALCIUM: 9.8 mg/dL (ref 8.4–10.5)
CO2: 25 mEq/L (ref 19–32)
Chloride: 106 mEq/L (ref 96–112)
Creatinine, Ser: 1.34 mg/dL (ref 0.50–1.35)
GFR calc Af Amer: 59 mL/min — ABNORMAL LOW (ref >90)
GFR calc non Af Amer: 51 mL/min — ABNORMAL LOW (ref >90)
Glucose, Bld: 106 mg/dL — ABNORMAL HIGH (ref 70–99)
Potassium: 5.1 mEq/L (ref 3.7–5.3)
Sodium: 142 mEq/L (ref 137–147)
Total Bilirubin: 0.4 mg/dL (ref 0.3–1.2)
Total Protein: 6.4 g/dL (ref 6.0–8.3)

## 2014-01-06 MED ORDER — SENNOSIDES-DOCUSATE SODIUM 8.6-50 MG PO TABS
2.0000 | ORAL_TABLET | Freq: Two times a day (BID) | ORAL | Status: DC
  Administered 2014-01-06 – 2014-01-09 (×4): 2 via ORAL
  Filled 2014-01-06 (×8): qty 2

## 2014-01-06 NOTE — Progress Notes (Signed)
Note/chart reviewed.  Calvin Merritt, RD, LDN Pager #: [phone] After-Hours Pager #: [phone]  

## 2014-01-06 NOTE — Evaluation (Signed)
Physical Therapy Assessment and Plan  Patient Details  Name: Calvin Merritt MRN: [id number] Date of Birth: August 05, 1940  PT Diagnosis: Abnormal posture, Abnormality of gait, Ataxia, Ataxic gait, Difficulty walking, Hemiparesis dominant and Impaired sensation Rehab Potential: Good ELOS: 10 - 14 days   Today's Date: 01/06/2014 PT Individual Time: 1194-1740 PT Individual Time Calculation (min): 70 min    Problem List:  Patient Active Problem List   Diagnosis Date Noted  . 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 Depression 01/05/2014  . PFO (patent foramen ovale) 01/05/2014  . Aortic valve stenosis, moderate 01/05/2014  . CVA (cerebral infarction) 01/05/2014  . Cerebral embolism with cerebral infarction 12/31/2013  . Health care maintenance 08/21/2013  . HIP FRACTURE, LEFT 11/14/2009  . CERUMEN IMPACTION, BILATERAL 07/27/2008  . 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 OBSTRUCTIVE SLEEP APNEA 06/25/2007  . HYPERLIPIDEMIA 05/19/2007  . HYPERTENSION 05/19/2007    Past Medical History:  Past Medical History  Diagnosis Date  . Hyperlipidemia   . Hypertension   . Spastic paraparesis   . 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 Acetabular fracture     left  . Diverticulosis   . PFO (patent foramen ovale) 01/05/2014  . 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 Aortic valve stenosis, moderate 01/05/2014   Past Surgical History:  Past Surgical History  Procedure Laterality Date  . Nephrectomy Right 11/05  . Hip surgery  05/11    L-acetabular fracture  . Tee without cardioversion N/A 01/04/2014    Procedure: TRANSESOPHAGEAL ECHOCARDIOGRAM (TEE);  Surgeon: Larey Dresser, MD;  Location: [facility] ENDOSCOPY;  Service: Cardiovascular;  Laterality: N/A;    Assessment & Plan Clinical Impression: Calvin Merritt is a 73 y.o. male with history of Hep C, familial spastic paraparesis with gait  disorder, HTN, who was admitted on 12/31/13 with right sided weakness and slurred speech. Patient reported that he had difficulty turning the key or gripping the wheel in his truck for about 30 minutes then fell out of his truck while trying to get out and struck his head. EMS activated and CT head in ED negative for hemorrhage. MRI brain with patchy areas of acute infarction in the left MCA territory, predominantly involving the parietal lobe and small acute right parietal infarct. MRA brain with 4 mm abnormality along the posterior aspect of the proximal right PCA described on CTA remains indeterminate with question of partially thrombosed aneurysm. 2D echo with EF 55-60%, moderate AVS, pseudonormal left ventricular filling pattern with grade 2 diastolic dysfunction. Carotid dopplers without significant ICA stenosis. Neurology recommends ASA for embolic stroke of unknown source. TEE with severely calcified aortic valve with moderate AS, EF 55-60%, small PFO, bubbles cross with valsalva and grade III plaque in descending thoracic aorta. BLE dopplers without DVT. Family reported that patient with stress as well as depressed mood due to pending divorce and recommended psych follow up but patient has declined this. Loop recorder placed yesterday.  He has had improvement in RUE strength but continues to have ataxia RUE/RLE, decreased balance as well as poor safety awareness.  Patient transferred to CIR on 01/05/2014 .   Patient currently requires min with bed mobility and max assist with gait due to 2 LOB secondary to muscle weakness, ataxia and decreased coordination and decreased standing balance, hemiplegia and decreased balance strategies.  Prior to hospitalization, patient was modified independent  with mobility and lived with Alone in a House home.  Home access is  Ramped entrance.  Patient will benefit from skilled PT intervention to maximize safe functional mobility, minimize fall risk and decrease caregiver  burden for planned discharge home with intermittent assist.  Anticipate patient will benefit from follow up OP at discharge.  PT - End of Session Activity Tolerance: Tolerates 30+ min activity without fatigue PT Assessment Rehab Potential: Good Barriers to Discharge: Decreased caregiver support PT Patient demonstrates impairments in the following area(s): Balance;Motor;Perception PT Transfers Functional Problem(s): Bed Mobility;Bed to Chair;Car PT Locomotion Functional Problem(s): Ambulation;Wheelchair Mobility;Stairs PT Plan PT Intensity: Minimum of 1-2 x/day ,45 to 90 minutes PT Frequency: 5 out of 7 days PT Duration Estimated Length of Stay: 10 - 14 days PT Treatment/Interventions: Ambulation/gait training;Balance/vestibular training;Discharge planning;Functional mobility training;Neuromuscular re-education;Patient/family education;Stair training;Therapeutic Activities;Therapeutic Exercise;UE/LE Strength taining/ROM;UE/LE Coordination activities;Wheelchair propulsion/positioning PT Transfers Anticipated Outcome(s): Mod I with basic and Supervision with car transfers PT Locomotion Anticipated Outcome(s): Supervision household ambulator with assistive device PT Recommendation Follow Up Recommendations: Outpatient PT Patient destination: Home Equipment Recommended: To be determined Equipment Details: patient has 2 rollators  Skilled Therapeutic Intervention Patient sitting in wheelchair upon entering room. Patient participated in initial evaluation. Patient attempted to propel wheelchair using bilateral UE, bilateral LE. Both with difficulty due to decreased sensation and coordination of right extremities. Patient instructed in hemi technique using left extremities and still required min cueing for sequencing. Patient ambulated with rollator 80 feet with mod assist due to incoordination. Patient with narrow base of support, very small steps bilaterally, left foot tends to internally rotate  and adduct, and flexed at hips and trunk. Patient required verbal cueing to modify gait pattern and mod assist with balance. Patient ambulated up and down 10 steps using bilateral hand rails and required verbal cueing for sequencing and mod assist with balance due to decreased coordination. Patient transferred wheelchair to mat with min assist squat pivot. Patient performed bilateral LE exercises working on slow controlled movements for heel slides, hip abduction, and stepping over and back x 10 reps each. Patient ambulated 150 feet with rollator working on increasing base of support with cueing. Patient had 2 loss of balance requiring max assist to regain during swing of right LE where foot did not clear. Patient sit to supine and moved to [facility] using rails with supervision. Patient left in bed with all items in reach and bed alarm set.    PT Evaluation Precautions/Restrictions Precautions Precautions: Fall Precaution Comments: Loop recorder Restrictions Weight Bearing Restrictions: No General   Vital Signs  Pain Pain Assessment Pain Assessment: No/denies pain Home Living/Prior Functioning Home Living Living Arrangements: Alone Vision/Perception  Perception Comments: decreased attention to right during activities  Cognition Overall Cognitive Status: Within Functional Limits for tasks assessed Arousal/Alertness: Awake/alert Orientation Level: Oriented X4 Sensation Sensation Light Touch: Impaired Detail Light Touch Impaired Details: Impaired RUE;Impaired RLE Stereognosis: Impaired Detail Stereognosis Impaired Details: Impaired RUE;Impaired RLE Proprioception: Impaired Detail Proprioception Impaired Details: Impaired RUE;Impaired RLE Coordination Gross Motor Movements are Fluid and Coordinated: No Fine Motor Movements are Fluid and Coordinated: No Finger Nose Finger Test: decreased control of right; does better when watching right UE Heel Shin Test: decreased control of right; needed  cues to slow down and attend to movement. Motor  Motor Motor: Abnormal tone;Ataxia  Mobility Bed Mobility Bed Mobility: Supine to Sit;Sit to Supine;Scooting to HOB Supine to Sit: 4: Min guard Sit to Supine: 5: Supervision Scooting to [facility]: With rail;5: Supervision;Other (comment)  Scooting to [facility] Details (indicate cue type and reason): patient required cueing to use LE's Transfers Transfers: Yes Sit to Stand: 3: Mod assist Stand to Sit: 3: Mod assist Stand to Sit Details: assist needed for controlled descent Locomotion  Ambulation Ambulation: Yes Ambulation/Gait Assistance: 2: Max assist Ambulation Distance (Feet): 150 Feet Assistive device: Rollator Ambulation/Gait Assistance Details: Verbal cues for gait pattern;Verbal cues for precautions/safety;Verbal cues for sequencing;Manual facilitation for weight shifting Gait Gait: Yes Gait Pattern: Impaired Gait Pattern: Step-to pattern;Decreased stride length;Right circumduction;Ataxic;Trunk flexed;Narrow base of support;Right flexed knee in stance;Left flexed knee in stance Gait velocity: slow Stairs / Additional Locomotion Stairs: Yes Stairs Assistance: 3: Mod assist Stair Management Technique: Two rails;Step to pattern Number of Stairs: 10 Height of Stairs: 4 Wheelchair Mobility Wheelchair Mobility: Yes Wheelchair Assistance: 4: Advertising account executive Details: Verbal cues for technique;Verbal cues for Information systems manager: Both upper extremities;Both lower extermities;Left lower extremity;Left upper extremity;Other (comment) (pt attempted with B UE's difficult due to right UE incoordination; attempted with B LE and difficulty with right LE coordination; instructed in hemi technique using left extremities.) Wheelchair Parts Management: Supervision/cueing Distance: 120 feet  Trunk/Postural Assessment  Postural Control Postural Control: Deficits on evaluation Righting Reactions: delayed Protective  Responses: delayed  Balance Dynamic Sitting Balance Dynamic Sitting - Balance Support: Feet supported;Bilateral upper extremity supported Dynamic Sitting - Level of Assistance: 5: Stand by assistance Static Standing Balance Static Standing - Balance Support: Bilateral upper extremity supported Static Standing - Level of Assistance: 4: Min assist Extremity Assessment  RUE Assessment RUE Assessment:  (4+/5- decr coordination) RUE AROM (degrees) RUE Overall AROM Comments: Brunstrom V, decr coordination/ motor planning RUE Tone RUE Tone: Within Functional Limits LUE Assessment LUE Assessment: Within Functional Limits RLE Assessment RLE Assessment: Exceptions to [facility] RLE Strength RLE Overall Strength: Deficits RLE Overall Strength Comments: grossly 4/5 throughout R LE; decreased coordination; ataxic movements LLE Assessment LLE Assessment: Within Functional Limits  FIM:  FIM - Bed/Chair Transfer Bed/Chair Transfer: 5: Supine > Sit: Supervision (verbal cues/safety issues);5: Sit > Supine: Supervision (verbal cues/safety issues);4: Bed > Chair or W/C: Min A (steadying Pt. > 75%);4: Chair or W/C > Bed: Min A (steadying Pt. > 75%) FIM - Locomotion: Wheelchair Distance: 120 feet Locomotion: Wheelchair: 2: Travels 50 - 149 ft with minimal assistance (Pt.>75%) FIM - Locomotion: Ambulation Locomotion: Ambulation Assistive Devices: Other (comment) (rollator) Ambulation/Gait Assistance: 2: Max assist Locomotion: Ambulation: 2: Travels 150 ft or more with maximal assistance (Pt: 25 - 49%) FIM - Locomotion: Stairs Locomotion: Scientist, physiological: Hand rail - 2 Locomotion: Stairs: 2: Up and Down 4 - 11 stairs with moderate assistance (Pt: 50 - 74%)   Refer to Care Plan for Long Term Goals  Recommendations for other services: None  Discharge Criteria: Patient will be discharged from PT if patient refuses treatment 3 consecutive times without medical reason, if treatment goals not met, if  there is a change in medical status, if patient makes no progress towards goals or if patient is discharged from hospital.  The above assessment, treatment plan, treatment alternatives and goals were discussed and mutually agreed upon: by patient  Sanjuana Letters 01/06/2014, 12:29 PM

## 2014-01-06 NOTE — Care Management Note (Signed)
[facility]patient Rehabilitation [facility] Individual Statement of Services  Patient Name:  Calvin Merritt  Date:  01/06/2014  Welcome to the [facility]patient [facility].  Our goal is to provide you with an individualized program based on your diagnosis and situation, designed to meet your specific needs.  With this comprehensive rehabilitation program, you will be expected to participate in at least 3 hours of rehabilitation therapies Monday-Friday, with modified therapy programming on the weekends.  Your rehabilitation program will include the following services:  Physical Therapy (PT), Occupational Therapy (OT), Speech Therapy (ST), 24 hour per day rehabilitation nursing, Therapeutic Recreaction (TR), Neuropsychology, Case Management (Social Worker), Rehabilitation Medicine, Nutrition Services and Pharmacy Services  Weekly team conferences will be held on Wednesday to discuss your progress.  Your Social Worker will talk with you frequently to get your input and to update you on team discussions.  Team conferences with you and your family in attendance may also be held.  Expected length of stay: 2 weeks Overall anticipated outcome: supervision level  Depending on your progress and recovery, your program may change. Your Social Worker will coordinate services and will keep you informed of any changes. Your Social Worker's name and contact numbers are listed  below.  The following services may also be recommended but are not provided by the Inpatient [facility] Center:   Driving Evaluations  Home Health Rehabiltiation Services  Outpatient Rehabilitation Services  Vocational Rehabilitation   Arrangements will be made to provide these services after discharge if needed.  Arrangements include referral to agencies that provide these services.  Your insurance has been verified to be:  UHC-Medicare Your primary doctor is:  Sandrea HughsMichael [name]  Pertinent information will be shared with your doctor and  your insurance company.  Social Worker:  Dossie DerBecky Markevius Trombetta, SW [phone] or [phone]) [phone]  Information discussed with and copy given to patient by: Lucy Chrisupree, Orlanda Frankum G, 01/06/2014, 10:17 AM
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
## 2014-01-06 NOTE — Evaluation (Signed)
Occupational Therapy Assessment and Plan  Patient Details  Name: Calvin Merritt MRN: [id number] Date of Birth: [birth year]  OT Diagnosis: apraxia and hemiplegia affecting dominant side Rehab Potential: Rehab Potential: Good ELOS: 2 1/2 weeks   Today's Date: 01/06/2014 OT Individual Time:  - 800-900 Second session : 13:00-14:00       Problem List:  Patient Active Problem List   Diagnosis Date Noted  . 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 Depression 01/05/2014  . PFO (patent foramen ovale) 01/05/2014  . Aortic valve stenosis, moderate 01/05/2014  . CVA (cerebral infarction) 01/05/2014  . Cerebral embolism with cerebral infarction 12/31/2013  . Health care maintenance 08/21/2013  . HIP FRACTURE, LEFT 11/14/2009  . CERUMEN IMPACTION, BILATERAL 07/27/2008  . 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 OBSTRUCTIVE SLEEP APNEA 06/25/2007  . HYPERLIPIDEMIA 05/19/2007  . HYPERTENSION 05/19/2007    Past Medical History:  Past Medical History  Diagnosis Date  . Hyperlipidemia   . Hypertension   . Spastic paraparesis   . 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 Acetabular fracture     left  . Diverticulosis   . PFO (patent foramen ovale) 01/05/2014  . 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 Aortic valve stenosis, moderate 01/05/2014   Past Surgical History:  Past Surgical History  Procedure Laterality Date  . Nephrectomy Right 11/05  . Hip surgery  05/11    L-acetabular fracture  . Tee without cardioversion N/A 01/04/2014    Procedure: TRANSESOPHAGEAL ECHOCARDIOGRAM (TEE);  Surgeon: Larey Dresser, MD;  Location: [facility] ENDOSCOPY;  Service: Cardiovascular;  Laterality: N/A;    Assessment & Plan Clinical Impression: Patient is a 73 y.o. year old male history of Hep C, familial spastic paraparesis with gait disorder, HTN, who was admitted on 12/31/13 with right sided weakness and  slurred speech. Patient reported that he had difficulty turning the key or gripping the wheel in his truck for about 30 minutes then fell out of his truck while trying to get out and struck his head. EMS activated and CT head in ED negative for hemorrhage. MRI brain with patchy areas of acute infarction in the left MCA territory, predominantly involving the parietal lobe and small acute right parietal infarct. MRA brain with 4 mm abnormality along the posterior aspect of the proximal right PCA described on CTA remains indeterminate with question of partially thrombosed aneurysm. 2D echo with EF 55-60%, moderate AVS, pseudonormal left ventricular filling pattern with grade 2 diastolic dysfunction. Carotid dopplers without significant ICA stenosis.   Neurology recommends ASA for embolic stroke of unknown source. TEE with severely calcified aortic valve with moderate AS, EF 55-60%, small PFO, bubbles cross with valsalva and grade III plaque in descending thoracic aorta. BLE dopplers without DVT. Family reported that patient with stress as well as depressed mood due to pending divorce and recommended psych follow up but patient has declined this. Loop recorder to be placed today. He has had improvement in RUE strength but continues to have ataxia RUE/RLE, decreased balance as well as poor safety awareness.   Patient transferred to CIR on 01/05/2014 .    Patient currently requires mod with basic self-care skills and basic mobility secondary to muscle weakness, decreased cardiorespiratoy endurance, impaired timing and sequencing, unbalanced muscle activation, motor apraxia, decreased coordination and decreased motor planning, decreased motor planning and decreased sitting balance, decreased standing balance, hemiplegia  and decreased balance strategies.  Prior to hospitalization, patient could complete ADL with modified independent .  Patient will benefit from skilled intervention to decrease level of assist with  basic self-care skills and increase independence with basic self-care skills prior to discharge home with care partner.  Anticipate patient will require intermittent supervision and follow up outpatient.  OT - End of Session Activity Tolerance: Tolerates 30+ min activity without fatigue OT Assessment Rehab Potential: Good OT Patient demonstrates impairments in the following area(s): Balance;Cognition;Endurance;Motor;Perception;Safety;Sensory OT Basic ADL's Functional Problem(s): Eating;Grooming;Bathing;Dressing;Toileting OT Advanced ADL's Functional Problem(s): Simple Meal Preparation OT Transfers Functional Problem(s): Toilet;Tub/Shower OT Additional Impairment(s): Fuctional Use of Upper Extremity OT Plan OT Intensity: Minimum of 1-2 x/day, 45 to 90 minutes OT Frequency: 5 out of 7 days OT Duration/Estimated Length of Stay: 2 1/2-3 weeks OT Treatment/Interventions: Balance/vestibular training;Cognitive remediation/compensation;Community reintegration;Discharge planning;UE/LE Coordination activities;Neuromuscular re-education;UE/LE Strength taining/ROM;Self Care/advanced ADL retraining;Functional mobility training;Psychosocial support;Therapeutic Exercise;DME/adaptive equipment instruction;Patient/family education;Therapeutic Activities;Wheelchair propulsion/positioning OT Self Feeding Anticipated Outcome(s): mod I  OT Basic Self-Care Anticipated Outcome(s): supervision OT Toileting Anticipated Outcome(s): supervision OT Bathroom Transfers Anticipated Outcome(s): supervision  OT Recommendation Recommendations for Other Services: Neuropsych consult Patient destination: Home Follow Up Recommendations: Outpatient OT Equipment Recommended: To be determined   Skilled Therapeutic Intervention OT eval iniated with OT goals, purpose and role discussed. Self care retraining at shower level with focus on stand pivot transfers, sit to stand, standing balance, toileting, use of right hand as  dominant, visual attention to right hand to increase coordination with self care tasks, normal patterns of movement, attention to coordination and decr sensation in right UE/LE. Pt performed bed mobility at beginning of session with compensatory methods to come to EOB- requiring tactile cues for normal postural movements. With toileting, using right hand to perform hygiene pt dropped tissue and was wiping with bare hand with decr awareness due to decr sensation. Pt also with dropping wash cloth in shower with bathing with right hand. Pt required mod cues to slow down.  2nd session: no pain . Neuro muscular reeducation with focus on sit to stand, standing balance, functional use and coordination of right UE/ hand.  Pt self propelled w/c with bilateral LEs with mod A for normal patterns of movement an dto avoid abnormal trunk movements. Participated in [facility] activities including in hand manipulation, pincher grasp, lateral key pinch, stereognosis, functional reach etc. Sit to stand, standing balance during game of Connect 4 to work on balance and use of right hand. Pt requests 10 min on Nustep with focus on maintaining right foot on pedal and left LE adduction. Squat pivot transfers with steady A.   OT Evaluation Precautions/Restrictions  Precautions Precautions: Fall Precaution Comments: uncoordinated on right side Restrictions Weight Bearing Restrictions: No General Chart Reviewed: Yes Family/Caregiver Present: No Vital Signs Therapy Vitals Temp: 98.2 F (36.8 C) Temp src: Oral Pulse Rate: 54 Resp: 18 BP: 161/74 mmHg Patient Position (if appropriate): Lying Oxygen Therapy SpO2: 94 % O2 Device: None (Room air) Pain Pain Assessment Pain Assessment: No/denies pain Home Living/Prior Functioning Home Living Family/patient expects to be discharged to:: Private residence Living Arrangements: Alone Available Help at Discharge: Family Type of Home: House Home Access: Ramped entrance Home  Layout: Two level;Laundry or work area in basement Additional Comments: Engineer, 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  Lives With: Alone ADL ADL ADL Comments: see FIM Vision/Perception  Vision- History Baseline Vision/History: No visual deficits (left eye droops- birth defect) Patient Visual Report: No change from baseline Vision- Assessment Vision Assessment?: No  apparent visual deficits  Cognition Overall Cognitive Status: Within Functional Limits for tasks assessed Arousal/Alertness: Awake/alert Orientation Level: Oriented X4 Attention: Selective Selective Attention: Appears intact Memory: Appears intact Awareness: Appears intact Problem Solving: Appears intact Sequencing: Appears intact Organizing: Appears intact Safety/Judgment: Appears intact Sensation Sensation Light Touch: Impaired Detail Light Touch Impaired Details: Impaired RUE;Impaired RLE Stereognosis: Impaired Detail Stereognosis Impaired Details: Impaired RUE;Impaired RLE Proprioception: Impaired Detail Proprioception Impaired Details: Impaired RUE;Impaired RLE Coordination Gross Motor Movements are Fluid and Coordinated: No Fine Motor Movements are Fluid and Coordinated: No Motor  Motor Motor: Abnormal tone;Motor impersistence;Motor apraxia Mobility  Transfers Transfers: Sit to Stand;Stand to Sit Sit to Stand: 3: Mod assist Stand to Sit: 3: Mod assist  Trunk/Postural Assessment  Cervical Assessment Cervical Assessment: Within Functional Limits Thoracic Assessment Thoracic Assessment:  (flexed forward) Lumbar Assessment Lumbar Assessment: Exceptions to [facility] (posterior pelvic tilt) Postural Control Postural Control: Deficits on evaluation Righting Reactions: delayed Protective Responses: delayed  Balance Balance Balance Assessed: Yes Dynamic Sitting Balance Dynamic Sitting - Level of Assistance: 4: Min assist Static Standing Balance Static Standing - Balance Support: During functional activity Static Standing -  Level of Assistance: 3: Mod assist Extremity/Trunk Assessment RUE Assessment RUE Assessment:  (4+/5- decr coordination) RUE AROM (degrees) RUE Overall AROM Comments: Brunstrom V, decr coordination/ motor planning RUE Tone RUE Tone: Within Functional Limits LUE Assessment LUE Assessment: Within Functional Limits  FIM:      Refer to Care Plan for Long Term Goals  Recommendations for other services: Neuropsych  Discharge Criteria: Patient will be discharged from OT if patient refuses treatment 3 consecutive times without medical reason, if treatment goals not met, if there is a change in medical status, if patient makes no progress towards goals or if patient is discharged from hospital.  The above assessment, treatment plan, treatment alternatives and goals were discussed and mutually agreed upon: by patient  Nicoletta Ba 01/06/2014, 8:43 AM

## 2014-01-06 NOTE — Progress Notes (Signed)
Subjective/Complaints: 73 y.o. male with history of Hep C, familial spastic paraparesis with gait disorder, HTN, who was admitted on 12/31/13 with right sided weakness and slurred speech. Patient reported that he had difficulty turning the key or gripping the wheel in his truck for about 30 minutes then fell out of his truck while trying to get out and struck his head. EMS activated and CT head in ED negative for hemorrhage. MRI brain with patchy areas of acute infarction in the left MCA territory, predominantly involving the parietal lobe and small acute right parietal infarct. MRA brain with 4 mm abnormality along the posterior aspect of the proximal right PCA described on CTA remains indeterminate with question of partially thrombosed aneurysm. 2D echo with EF 55-60%, moderate AVS, pseudonormal left ventricular filling pattern with grade 2 diastolic dysfunction. Carotid dopplers without significant ICA stenosis.    Neurology recommends ASA for embolic stroke of unknown source. TEE with severely calcified aortic valve with moderate AS, EF 55-60%, small PFO, bubbles cross with valsalva and grade III plaque in descending thoracic aorta.  BLE dopplers without DVT.   Family reported that patient with stress as well as depressed mood due to pending divorce and recommended psych follow up but patient has declined this. Loop recorder to be placed    Review of Systems - Negative except both legs weak, constipation, poor appetite  [facility]:  Has 2 sisters with problems walking Objective: Vital Signs: Blood pressure 161/74, pulse 54, temperature 98.2 F (36.8 C), temperature source Oral, resp. rate 18, weight 80.5 kg (177 lb 7.5 oz), SpO2 94.00%. No results found. Results for orders placed during the hospital encounter of 01/05/14 (from the past 72 hour(s))  CBC WITH DIFFERENTIAL     Status: Abnormal   Collection Time    01/06/14  5:00 AM      Result Value Ref Range   WBC 12.1 (*) 4.0 - 10.5 K/uL   RBC  4.98  4.22 - 5.81 MIL/uL   Hemoglobin 14.8  13.0 - 17.0 g/dL   HCT 21.3  08.6 - 57.8 %   MCV 87.3  78.0 - 100.0 fL   MCH 29.7  26.0 - 34.0 pg   MCHC 34.0  30.0 - 36.0 g/dL   RDW 46.9  62.9 - 52.8 %   Platelets 279  150 - 400 K/uL   Neutrophils Relative % 52  43 - 77 %   Neutro Abs 6.1  1.7 - 7.7 K/uL   Lymphocytes Relative 37  12 - 46 %   Lymphs Abs 4.5 (*) 0.7 - 4.0 K/uL   Monocytes Relative 8  3 - 12 %   Monocytes Absolute 1.0  0.1 - 1.0 K/uL   Eosinophils Relative 3  0 - 5 %   Eosinophils Absolute 0.4  0.0 - 0.7 K/uL   Basophils Relative 0  0 - 1 %   Basophils Absolute 0.0  0.0 - 0.1 K/uL     HEENT: normal Cardio: RRR and Gr 3/6 SEM Resp: CTA B/L and unlabored GI: BS positive and NT, ND Extremity:  Pulses positive and No Edema Skin:   Intact Neuro: Alert/Oriented, Cranial Nerve II-XII normal, Abnormal Sensory reduced sensation in RUE to LT, Abnormal Motor 5/5 LUE, 4/5 RUE, 3/5 BLE and Tone:  increased bilateral LE extensor tone, MAS 2-3 Musc/Skel:  Other decreased ROM bilateral Hips, Knees, ankles Gen NAD   Assessment/Plan: 1. Functional deficits secondary to Right MCA infarct which require 3+ hours per day of  interdisciplinary therapy in a comprehensive inpatient rehab setting. Physiatrist is providing close team supervision and 24 hour management of active medical problems listed below. Physiatrist and rehab team continue to assess barriers to discharge/monitor patient progress toward functional and medical goals. FIM:                   Comprehension Comprehension Mode: Auditory Comprehension: 5-Follows basic conversation/direction: With no assist  Expression Expression Mode: Verbal Expression: 5-Expresses basic needs/ideas: With no assist  Social Interaction Social Interaction: 7-Interacts appropriately with others - No medications needed.  Problem Solving Problem Solving: 6-Solves complex problems: With extra time  Memory Memory: 7-Complete  Independence: No helper  Medical Problem List and Plan: 1. 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  DVT Prophylaxis/Anticoagulation: Pharmaceutical: Lovenox 3. Pain Management: Tylenol prn.   4. Depression/Mood: Willing to start antidepressant. Willing to talk to psychologist about current impairments  as well as issues regarding ex-wife.   5. Neuropsych: This patient is capable of making decisions on his own behalf. 6. Skin/Wound Care: Routine pressure relief measures. Maintain adequate hydration and nutritional status.   7. Dyslipidemia: On lipitor   LOS (Days) 1 A FACE TO FACE EVALUATION WAS PERFORMED  KIRSTEINS,ANDREW E 01/06/2014, 7:41 AM

## 2014-01-06 NOTE — Progress Notes (Signed)
Nutrition Brief Note  Patient identified on the Malnutrition Screening Tool (MST) Report  Wt Readings from Last 15 Encounters:  01/05/14 177 lb 7.5 oz (80.5 kg)  12/31/13 184 lb 4.9 oz (83.6 kg)  12/31/13 184 lb 4.9 oz (83.6 kg)  12/31/13 184 lb 4.9 oz (83.6 kg)  08/19/13 185 lb (83.915 kg)  12/22/12 189 lb (85.73 kg)  06/22/12 207 lb 6.4 oz (94.076 kg)  01/14/12 209 lb (94.802 kg)  10/25/10 217 lb (98.431 kg)  12/22/08 207 lb 6.1 oz (94.068 kg)  07/27/08 212 lb (96.163 kg)  07/19/08 207 lb 2.1 oz (93.954 kg)  06/10/08 209 lb (94.802 kg)  03/17/08 206 lb 6.1 oz (93.614 kg)  06/25/07 207 lb (93.895 kg)    Body mass index is 23.42 kg/(m^2). Patient meets criteria for normal based on current BMI.   Current diet order is Cardiac, patient is consuming approximately 75-100% of meals at this time. Pt has a good appetite. Labs and medications reviewed.   Pt does not want any oral supplements. No nutrition interventions warranted at this time. If nutrition issues arise, please consult RD.   Marijean NiemannStephanie La, MS, Provisional LDN Pager # [phone] After hours/ weekend pager # [phone]

## 2014-01-07 ENCOUNTER — Inpatient Hospital Stay (HOSPITAL_COMMUNITY): Payer: Medicare Other | Admitting: Physical Therapy

## 2014-01-07 ENCOUNTER — Encounter (HOSPITAL_COMMUNITY): Payer: Medicare Other | Admitting: Occupational Therapy

## 2014-01-07 ENCOUNTER — Inpatient Hospital Stay (HOSPITAL_COMMUNITY): Payer: Medicare Other | Admitting: Occupational Therapy

## 2014-01-07 NOTE — Progress Notes (Signed)
Subjective/Complaints: 73 y.o. male with history of Hep C, familial spastic paraparesis with gait disorder, HTN, who was admitted on 12/31/13 with right sided weakness and slurred speech. Patient reported that he had difficulty turning the key or gripping the wheel in his truck for about 30 minutes then fell out of his truck while trying to get out and struck his head. EMS activated and CT head in ED negative for hemorrhage. MRI brain with patchy areas of acute infarction in the left MCA territory, predominantly involving the parietal lobe and small acute right parietal infarct. MRA brain with 4 mm abnormality along the posterior aspect of the proximal right PCA described on CTA remains indeterminate with question of partially thrombosed aneurysm. 2D echo with EF 55-60%, moderate AVS, pseudonormal left ventricular filling pattern with grade 2 diastolic dysfunction. Carotid dopplers without significant ICA stenosis.    Neurology recommends ASA for embolic stroke of unknown source.  Didn't work me hard enough in PT  Review of Systems - Negative except both legs weak, constipation, poor appetite  [place]:  Has 2 sisters with problems walking Objective: Vital Signs: Blood pressure 124/64, pulse 51, temperature 98.4 F (36.9 C), temperature source Oral, resp. rate 18, weight 80.5 kg (177 lb 7.5 oz), SpO2 93.00%. No results found. Results for orders placed during the hospital encounter of 01/05/14 (from the past 72 hour(s))  CBC WITH DIFFERENTIAL     Status: Abnormal   Collection Time    01/06/14  5:00 AM      Result Value Ref Range   WBC 12.1 (*) 4.0 - 10.5 K/uL   RBC 4.98  4.22 - 5.81 MIL/uL   Hemoglobin 14.8  13.0 - 17.0 g/dL   HCT 43.5  39.0 - 52.0 %   MCV 87.3  78.0 - 100.0 fL   MCH 29.7  26.0 - 34.0 pg   MCHC 34.0  30.0 - 36.0 g/dL   RDW 13.2  11.5 - 15.5 %   Platelets 279  150 - 400 K/uL   Neutrophils Relative % 52  43 - 77 %   Neutro Abs 6.1  1.7 - 7.7 K/uL   Lymphocytes Relative  37  12 - 46 %   Lymphs Abs 4.5 (*) 0.7 - 4.0 K/uL   Monocytes Relative 8  3 - 12 %   Monocytes Absolute 1.0  0.1 - 1.0 K/uL   Eosinophils Relative 3  0 - 5 %   Eosinophils Absolute 0.4  0.0 - 0.7 K/uL   Basophils Relative 0  0 - 1 %   Basophils Absolute 0.0  0.0 - 0.1 K/uL  COMPREHENSIVE METABOLIC PANEL     Status: Abnormal   Collection Time    01/06/14  5:00 AM      Result Value Ref Range   Sodium 142  137 - 147 mEq/L   Potassium 5.1  3.7 - 5.3 mEq/L   Chloride 106  96 - 112 mEq/L   CO2 25  19 - 32 mEq/L   Glucose, Bld 106 (*) 70 - 99 mg/dL   BUN 25 (*) 6 - 23 mg/dL   Creatinine, Ser 1.34  0.50 - 1.35 mg/dL   Calcium 9.8  8.4 - 10.5 mg/dL   Total Protein 6.4  6.0 - 8.3 g/dL   Albumin 3.2 (*) 3.5 - 5.2 g/dL   AST 16  0 - 37 U/L   ALT 14  0 - 53 U/L   Alkaline Phosphatase 57  39 - 117 U/L  Total Bilirubin 0.4  0.3 - 1.2 mg/dL   GFR calc non Af Amer 51 (*) >90 mL/min   GFR calc Af Amer 59 (*) >90 mL/min   Comment: (NOTE)     The eGFR has been calculated using the CKD EPI equation.     This calculation has not been validated in all clinical situations.     eGFR's persistently <90 mL/min signify possible Chronic Kidney     Disease.   Anion gap 11  5 - 15     HEENT: normal Cardio: RRR and Gr 3/6 SEM Resp: CTA B/L and unlabored GI: BS positive and NT, ND Extremity:  Pulses positive and No Edema Skin:   Intact Neuro: Alert/Oriented, Cranial Nerve II-XII normal, Abnormal Sensory reduced sensation in RUE and RLE to LT, Abnormal Motor 5/5 LUE, 4/5 RUE, 3/5 BLE and Tone:  increased bilateral LE extensor tone, MAS 2-3 Musc/Skel:  Other decreased ROM bilateral Hips, Knees, ankles Gen NAD   Assessment/Plan: 1. Functional deficits secondary to Right MCA infarct which require 3+ hours per day of interdisciplinary therapy in a comprehensive inpatient rehab setting. Physiatrist is providing close team supervision and 24 hour management of active medical problems listed  below. Physiatrist and rehab team continue to assess barriers to discharge/monitor patient progress toward functional and medical goals. FIM: FIM - Bathing Bathing Steps Patient Completed: Chest;Right Arm;Left Arm;Abdomen;Front perineal area;Right upper leg;Left upper leg Bathing: 3: Mod-Patient completes 5-7 80f 10 parts or 50-74%  FIM - Upper Body Dressing/Undressing Upper body dressing/undressing steps patient completed: Thread/unthread right sleeve of pullover shirt/dresss;Thread/unthread left sleeve of pullover shirt/dress;Put head through opening of pull over shirt/dress;Pull shirt over trunk Upper body dressing/undressing: 4: Min-Patient completed 75 plus % of tasks FIM - Lower Body Dressing/Undressing Lower body dressing/undressing steps patient completed: Thread/unthread right underwear leg;Thread/unthread left underwear leg;Thread/unthread right pants leg;Thread/unthread left pants leg;Don/Doff right sock;Don/Doff left sock;Don/Doff right shoe;Don/Doff left shoe Lower body dressing/undressing: 3: Mod-Patient completed 50-74% of tasks  FIM - Toileting Toileting steps completed by patient: Adjust clothing prior to toileting Toileting: 2: Max-Patient completed 1 of 3 steps  FIM - Toilet Transfers Toilet Transfers: 2-From toilet/BSC: Max A (lift and lower assist);2-To toilet/BSC: Max A (lift and lower assist)  FIM - Bed/Chair Transfer Bed/Chair Transfer: 5: Supine > Sit: Supervision (verbal cues/safety issues);5: Sit > Supine: Supervision (verbal cues/safety issues);4: Bed > Chair or W/C: Min A (steadying Pt. > 75%);4: Chair or W/C > Bed: Min A (steadying Pt. > 75%)  FIM - Locomotion: Wheelchair Distance: 120 feet Locomotion: Wheelchair: 2: Travels 50 - 149 ft with minimal assistance (Pt.>75%) FIM - Locomotion: Ambulation Locomotion: Ambulation Assistive Devices: Other (comment) (rollator) Ambulation/Gait Assistance: 2: Max assist Locomotion: Ambulation: 2: Travels 150 ft or more  with maximal assistance (Pt: 25 - 49%)  Comprehension Comprehension Mode: Auditory Comprehension: 5-Follows basic conversation/direction: With extra time/assistive device  Expression Expression Mode: Verbal Expression: 5-Expresses basic needs/ideas: With no assist  Social Interaction Social Interaction: 6-Interacts appropriately with others with medication or extra time (anti-anxiety, antidepressant).  Problem Solving Problem Solving: 4-Solves basic 75 - 89% of the time/requires cueing 10 - 24% of the time  Memory Memory: 5-Requires cues to use assistive device  Medical Problem List and Plan: 1. 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  DVT Prophylaxis/Anticoagulation: Pharmaceutical: Lovenox 3. Pain Management: Tylenol prn.   4. Depression/Mood: Willing to start antidepressant. Willing to talk to psychologist about current impairments  as well as issues regarding ex-wife.   5. Neuropsych: This patient is capable of making decisions on his own behalf. 6. Skin/Wound Care: Routine pressure relief measures. Maintain adequate hydration and nutritional status.   7. Dyslipidemia: On lipitor   LOS (Days) 2 A FACE TO FACE EVALUATION WAS PERFORMED  KIRSTEINS,ANDREW E 01/07/2014, 7:25 AM

## 2014-01-07 NOTE — Progress Notes (Signed)
Occupational Therapy Session Note  Patient Details  Name: Calvin Merritt MRN: [id number] Date of Birth: November 17, 1940  Today's Date: 01/07/2014 OT Individual Time: [id number] and 1410-1514 OT Individual Time Calculation (min): 60 min and 64 min   Short Term Goals: Week 1:  OT Short Term Goal 1 (Week 1): Pt will self feed with right hand with build up utensil with setup with extra time for 50% of meal OT Short Term Goal 2 (Week 1): Pt will transfer stand pivot to toilet with min A  OT Short Term Goal 3 (Week 1): Pt will perform clothing management with bilateral UEs with steady A in standing  OT Short Term Goal 4 (Week 1): Pt will demonstrate bed mobility in prep for ADL without UE support with min A with extra time  Skilled Therapeutic Interventions/Progress Updates:    1) Engaged in ADL retraining with focus on transfers, sit > stand, and functional use of RUE during self-care tasks of bathing and dressing.  Pt in bed upon arrival, reporting need to toilet.  Initially unwilling to ambulate therefore completed bed > w/c > toilet transfer with mod assist with pt requiring lifting assistance and mod verbal cues for safety and BLE placement prior to transfers.  Lateral lean in sitting to wipe buttocks post toileting.  Ambulated from toilet to shower chair in room shower with RW with manual facilitation for weight shifting to sit on shower chair.  Pt completed bathing at seated level with multiple drops of washcloth without awareness due to decreased sensation in RUE.  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  Dressing completed at sit > stand level at sink with focus on hand placement for sit > stand as pt attempting to pull up on sink with each standing attempt.  Educated on hemi-dressing technique with LB dressing, assisting with threading RLE and physical assist with pulling up underwear and fastening pants.  Pt with multiple attempts to fasten pants but due to decreased  coordination and sensation unable to fasten pants this session but able to fasten belt with increased time.  Pt donned button up shirt and fastened buttons with increased time due to decreased [facility] and motor planning.  Grooming completed in standing with min assist to maintain standing balance, pt also leaned against sink to increase standing balance.  2) Engaged in NMR and self-care retraining with focus on RUE [facility] and visual attention to Rt hand to compensate for decreased sensation.  Engaged in simulated self-feedings tasks with scooping items with spoon and cutting theraputty with fork and knife. Due to decreased sensation and FMC, spoon and knife would rotate in hand with pt with decreased awareness requiring verbal cues to correct.  Educated on visual attention to task to increase success with grasp and FMC.  Utilized theraputty with pt locating items in putty with Rt hand.  Pt requested 10 mins on Nustep with focus on maintaining Rt foot on pedal and Rt hand on handle.  Squat pivot transfers throughout session min/steady assist.  Therapy Documentation Precautions:  Precautions Precautions: Fall Precaution Comments: Loop recorder Restrictions Weight Bearing Restrictions: No Pain:   Pt with no c/o pain ADL: ADL ADL Comments: see FIM  See FIM for current functional status  Therapy/Group: Individual Therapy  Rosalio LoudHOXIE, Alonza Knisley 01/07/2014, 11:42 AM

## 2014-01-07 NOTE — Progress Notes (Signed)
Physical Therapy Session Note  Patient Details  Name: Calvin Merritt MRN: [id number] Date of Birth: 15-Mar-1941  Today's Date: 01/07/2014 PT Individual Time: [id number] PT Individual Time Calculation (min): 60 min   Short Term Goals: Week 1:  PT Short Term Goal 1 (Week 1): Patient will be independent with bed mobility of regular double bed to simulate home environment. PT Short Term Goal 2 (Week 1): Patient will perform sit to stand to rollator with close supervision PT Short Term Goal 3 (Week 1): Patient will ambulate with rollator 150 feet with min assist PT Short Term Goal 4 (Week 1): Patient will ambulate up/down ramp with min assist using rollator PT Short Term Goal 5 (Week 1): Patient will transfer bed <> wheelchair with close supervision.  Skilled Therapeutic Interventions/Progress Updates:   Pt received on NuStep with handoff from OT, additional 10 min for total of 20 min at level 6 using BUE/LE. Upon standing from NuStep, pt noted to be incontinent of urine. Pt ambulated with rollator and mod A x 75 ft with LOB requiring total A to recover. Pt returned to room, where he doffed pants with at sit <> stand level from w/c with sink for UE support and close supervision-min A. Pt able to maintain standing up to 5 min. Patient trialled RW with increased control and decreased LOB noted-rollator removed from room. Pt instructed in w/c mobility using L hemi technique but patient unable to isolate movement to one side of body. Patient instructed in propelling w/c with BLEs for improved coordination and LE strengthening/ROM, performs movement with both extremities simultaneously in hopping pattern unless alternating knee flex/ext manually facilitated by therapist, max A. Pt instructed in squat pivot transfer w/c > bed with overall min A and total cues for appropriate hand placement, sequencing, and technique. Pt left semi reclined in bed with all needs within reach. Session focused on coordination,  control and speed of movement, pacing mobility, and R sided awareness.   Therapy Documentation Precautions:  Precautions Precautions: Fall Precaution Comments: Loop recorder Restrictions Weight Bearing Restrictions: No Vital Signs: Therapy Vitals Temp: 98.5 F (36.9 C) Temp src: Oral Pulse Rate: 59 Resp: 18 BP: 134/69 mmHg Patient Position (if appropriate): Sitting Oxygen Therapy SpO2: 94 % O2 Device: None (Room air) Pain: Pain Assessment Pain Assessment: No/denies pain Locomotion : Ambulation Ambulation/Gait Assistance: 3: Mod assist;2: Max Lawyerassist Wheelchair Mobility Distance: 50   See FIM for current functional status  Therapy/Group: Individual Therapy  Kerney ElbeVarner, Myer Bohlman A 01/07/2014, 4:44 PM

## 2014-01-08 ENCOUNTER — Inpatient Hospital Stay (HOSPITAL_COMMUNITY): Payer: Medicare Other

## 2014-01-08 NOTE — Plan of Care (Signed)
Problem: RH BOWEL ELIMINATION Goal: RH STG MANAGE BOWEL WITH ASSISTANCE STG Manage Bowel with min Assistance.  Outcome: Not Progressing Refuses laxatives today due to therapy ; pt educated and agreed to take this evening

## 2014-01-08 NOTE — Progress Notes (Signed)
Occupational Therapy Session Note  Patient Details  Name: Calvin Merritt MRN: [id number] Date of Birth: 22-Oct-1940  Today's Date: 01/08/2014 OT Individual Time: 1100-1200 and [id number] OT Individual Time Calculation (min): 60 min and 30 min    Short Term Goals: Week 1:  OT Short Term Goal 1 (Week 1): Pt will self feed with right hand with build up utensil with setup with extra time for 50% of meal OT Short Term Goal 2 (Week 1): Pt will transfer stand pivot to toilet with min A  OT Short Term Goal 3 (Week 1): Pt will perform clothing management with bilateral UEs with steady A in standing  OT Short Term Goal 4 (Week 1): Pt will demonstrate bed mobility in prep for ADL without UE support with min A with extra time  Skilled Therapeutic Interventions/Progress Updates:    Session 1: Pt seen for ADL retraining with focus on functional use of RUE, functional transfers, sit<>stand, and standing balance. Pt received sitting in w/c. Completed squat pivot transfer w/c>shower chair (walk-in shower) with mod assist and mod cues for safety d/t impulsivity. Required mod assist for sit<>stand and max cues for visually attending to RUE during functional use as pt dropped wash cloth 3x. Emphasized slow, controlled movements during bathing as well. Completed dressing from w/c level with min assist sit<>stand and standing balance with 1 arm on sink for support. Pt with increased difficulty with FM task of tying shoes. Completed sit<>stand without UE support 3x and emphasis on pushing up from w/c with RUE. Pt with increased anxiety and impulsivity requiring max assist for balance up to 3 seconds. Pt demonstrating strong posterior lean and impaired safety as he was attempting to grab onto therapist and nearby table on wheels. Pt left sitting in w/c with QRB donned and all needs in reach.   Session 2: Pt seen for 1:1 OT session with focus on FM and gross motor coordination with RUE. Completed clothes pin tree in sitting  with emphasis on visually attending to RUE when using it functionally. Pt placed clothes pens with increased time and dropping multiple ones. When removing pens, emphasized on reaching across midline to place in tub. Completed large peg task with improved coordination noted. Completed medium coband wrapped peg board to facilitate pincer grasp then progressed to medium pegs without coband. Pt frequently dropped pegs without coband and required mod cues to visually attend to R hand. Provided foam cube for pt to keep in room to continue increasing [facility] and strength in right hand. Pt left sitting in w/c with all needs in reach.    Therapy Documentation Precautions:  Precautions Precautions: Fall Precaution Comments: Loop recorder Restrictions Weight Bearing Restrictions: No General:   Vital Signs:   Pain: Pt with no report of pain during therapy session.   See FIM for current functional status  Therapy/Group: Individual Therapy  Daneil Danerkinson, Wrigley Plasencia N 01/08/2014, 12:04 PM

## 2014-01-08 NOTE — Progress Notes (Addendum)
Physical Therapy Session Note  Patient Details  Name: Calvin Merritt MRN: [id number] Date of Birth: [birth year]  Today's Date: 01/08/2014 PT Individual Time: 0900-1000 PT Individual Time Calculation (min): 60 min   Short Term Goals: Week 1:  PT Short Term Goal 1 (Week 1): Patient will be independent with bed mobility of regular double bed to simulate home environment. PT Short Term Goal 2 (Week 1): Patient will perform sit to stand to rollator with close supervision PT Short Term Goal 3 (Week 1): Patient will ambulate with rollator 150 feet with min assist PT Short Term Goal 4 (Week 1): Patient will ambulate up/down ramp with min assist using rollator PT Short Term Goal 5 (Week 1): Patient will transfer bed <> wheelchair with close supervision.  Skilled Therapeutic Interventions/Progress Updates:  1:1. Pt received semi-reclined in bed, ready for therapy. Focus this session on gross NMR and overall safety during functional transfers. Pt req (S) for t/f sup<>sit EOB w/ use of hospital bed functions and min A for LB dressing. Pt demonstrating very poor initial balance sitting EOB and standing, req min-mod A due to strong posterior lean. Min A for toilet transfer, but max A for clothing management.   Focus on t/f R and L sidelying<>sit on tx mat for safety, coordination and increased WB through B UE, req min A for completion. Pt practiced multiple t/f sit<>stand initially w/ RW, decreasing to no B UE support from elevated mat to target increased WB through R LE and quad control. Pt very fearful of falling during task, req overall mod A, but then stating "is this something that I could do in the room by myself?" Education regarding safety and fall prevention. Pt propelled 200'x2 to target B UE coordination.  Pt req max multimodal cues throughout session to decrease speed of gross body movement for increased control, coordination, R awareness and overall safety due to impulsivity.   Pt left sitting in  w/c at end of session w/ all needs in reach, quick release belt in place. No reports of pain this session.   Therapy Documentation Precautions:  Precautions Precautions: Fall Precaution Comments: Loop recorder Restrictions Weight Bearing Restrictions: No  See FIM for current functional status  Therapy/Group: Individual Therapy  Denzil Hughes 01/08/2014, 12:21 PM

## 2014-01-08 NOTE — Progress Notes (Signed)
Physical Therapy Session Note  Patient Details  Name: Calvin Merritt MRN: [id number] Date of Birth: [birth year]  Today's Date: 01/08/2014 PT Individual Time: 1330-1400 PT Individual Time Calculation (min): 30 min   Short Term Goals: Week 1:  PT Short Term Goal 1 (Week 1): Patient will be independent with bed mobility of regular double bed to simulate home environment. PT Short Term Goal 2 (Week 1): Patient will perform sit to stand to rollator with close supervision PT Short Term Goal 3 (Week 1): Patient will ambulate with rollator 150 feet with min assist PT Short Term Goal 4 (Week 1): Patient will ambulate up/down ramp with min assist using rollator PT Short Term Goal 5 (Week 1): Patient will transfer bed <> wheelchair with close supervision.  Skilled Therapeutic Interventions/Progress Updates:  1:1. Pt received sitting in w/c, ready for therapy. Pt denies any pain. Focus this session on gross NMR. Pt propelled w/c 175' w/ B UE, initial HOH assist for technique, cues to decrease speed and intermittently look at R UE for increased quality of propulsion. Pt req (S) overall for w/c propulsion. Pt utilized NuStep to target B UE/LE coordination and control, level 6x61min. Min guard A for squat pivot t/f w/c<>NuStep. Pt left sitting in w/c at end of session w/ all needs in reach, quick release belt in place.   Therapy Documentation Precautions:  Precautions Precautions: Fall Precaution Comments: Loop recorder Restrictions Weight Bearing Restrictions: No Pain: Pain Assessment Pain Assessment: No/denies pain  See FIM for current functional status  Therapy/Group: Individual Therapy  Denzil Hughes 01/08/2014, 1:48 PM

## 2014-01-08 NOTE — Progress Notes (Signed)
Subjective/Complaints: 73 y.o. male with history of Hep C, familial spastic paraparesis with gait disorder, HTN, who was admitted on 12/31/13 with right sided weakness and slurred speech. Patient reported that he had difficulty turning the key or gripping the wheel in his truck for about 30 minutes then fell out of his truck while trying to get out and struck his head. EMS activated and CT head in ED negative for hemorrhage. MRI brain with patchy areas of acute infarction in the left MCA territory, predominantly involving the parietal lobe and small acute right parietal infarct. MRA brain with 4 mm abnormality along the posterior aspect of the proximal right PCA described on CTA remains indeterminate with question of partially thrombosed aneurysm. 2D echo with EF 55-60%, moderate AVS, pseudonormal left ventricular filling pattern with grade 2 diastolic dysfunction. Carotid dopplers without significant ICA stenosis.   Slept well No pains appetite improving. Taking laxative today  Review of Systems - Negative except both legs weak, constipation  FMH:  Has 2 sisters with problems walking Objective: Vital Signs: Blood pressure 142/59, pulse 50, temperature 98.6 F (37 C), temperature source Oral, resp. rate 19, weight 80.5 kg (177 lb 7.5 oz), SpO2 94.00%. No results found. Results for orders placed during the hospital encounter of 01/05/14 (from the past 72 hour(s))  CBC WITH DIFFERENTIAL     Status: Abnormal   Collection Time    01/06/14  5:00 AM      Result Value Ref Range   WBC 12.1 (*) 4.0 - 10.5 K/uL   RBC 4.98  4.22 - 5.81 MIL/uL   Hemoglobin 14.8  13.0 - 17.0 g/dL   HCT 43.5  39.0 - 52.0 %   MCV 87.3  78.0 - 100.0 fL   MCH 29.7  26.0 - 34.0 pg   MCHC 34.0  30.0 - 36.0 g/dL   RDW 13.2  11.5 - 15.5 %   Platelets 279  150 - 400 K/uL   Neutrophils Relative % 52  43 - 77 %   Neutro Abs 6.1  1.7 - 7.7 K/uL   Lymphocytes Relative 37  12 - 46 %   Lymphs Abs 4.5 (*) 0.7 - 4.0 K/uL   Monocytes Relative 8  3 - 12 %   Monocytes Absolute 1.0  0.1 - 1.0 K/uL   Eosinophils Relative 3  0 - 5 %   Eosinophils Absolute 0.4  0.0 - 0.7 K/uL   Basophils Relative 0  0 - 1 %   Basophils Absolute 0.0  0.0 - 0.1 K/uL  COMPREHENSIVE METABOLIC PANEL     Status: Abnormal   Collection Time    01/06/14  5:00 AM      Result Value Ref Range   Sodium 142  137 - 147 mEq/L   Potassium 5.1  3.7 - 5.3 mEq/L   Chloride 106  96 - 112 mEq/L   CO2 25  19 - 32 mEq/L   Glucose, Bld 106 (*) 70 - 99 mg/dL   BUN 25 (*) 6 - 23 mg/dL   Creatinine, Ser 1.34  0.50 - 1.35 mg/dL   Calcium 9.8  8.4 - 10.5 mg/dL   Total Protein 6.4  6.0 - 8.3 g/dL   Albumin 3.2 (*) 3.5 - 5.2 g/dL   AST 16  0 - 37 U/L   ALT 14  0 - 53 U/L   Alkaline Phosphatase 57  39 - 117 U/L   Total Bilirubin 0.4  0.3 - 1.2 mg/dL  GFR calc non Af Amer 51 (*) >90 mL/min   GFR calc Af Amer 59 (*) >90 mL/min   Comment: (NOTE)     The eGFR has been calculated using the CKD EPI equation.     This calculation has not been validated in all clinical situations.     eGFR's persistently <90 mL/min signify possible Chronic Kidney     Disease.   Anion gap 11  5 - 15     HEENT: normal Cardio: RRR and Gr 3/6 SEM Resp: CTA B/L and unlabored GI: BS positive and NT, ND Extremity:  Pulses positive and No Edema Skin:   Intact Neuro: Alert/Oriented, Cranial Nerve II-XII normal, Abnormal Sensory reduced sensation in RUE and RLE to LT, Abnormal Motor 5/5 LUE, 4/5 RUE, 3/5 BLE and Tone:  increased bilateral LE extensor tone, MAS 2-3 Musc/Skel:  Other decreased ROM bilateral Hips, Knees, ankles Gen NAD   Assessment/Plan: 1. Functional deficits secondary to Right MCA infarct which require 3+ hours per day of interdisciplinary therapy in a comprehensive inpatient rehab setting. Physiatrist is providing close team supervision and 24 hour management of active medical problems listed below. Physiatrist and rehab team continue to assess barriers to  discharge/monitor patient progress toward functional and medical goals. FIM: FIM - Bathing Bathing Steps Patient Completed: Chest;Right Arm;Left Arm;Abdomen;Right upper leg;Left upper leg Bathing: 3: Mod-Patient completes 5-7 73f 10 parts or 50-74%  FIM - Upper Body Dressing/Undressing Upper body dressing/undressing steps patient completed: Thread/unthread right sleeve of pullover shirt/dresss;Put head through opening of pull over shirt/dress;Thread/unthread left sleeve of pullover shirt/dress;Pull shirt over trunk;Thread/unthread right sleeve of front closure shirt/dress;Thread/unthread left sleeve of front closure shirt/dress;Button/unbutton shirt;Pull shirt around back of front closure shirt/dress Upper body dressing/undressing: 5: Set-up assist to: Obtain clothing/put away FIM - Lower Body Dressing/Undressing Lower body dressing/undressing steps patient completed: Thread/unthread right underwear leg;Thread/unthread left underwear leg;Thread/unthread left pants leg;Pull pants up/down;Don/Doff right sock;Don/Doff left sock;Don/Doff right shoe;Don/Doff left shoe;Fasten/unfasten right shoe;Fasten/unfasten left shoe Lower body dressing/undressing: 4: Min-Patient completed 75 plus % of tasks  FIM - Toileting Toileting steps completed by patient: Performs perineal hygiene Toileting Assistive Devices: Grab bar or rail for support Toileting: 2: Max-Patient completed 1 of 3 steps  FIM - Radio producer Devices: Grab bars Toilet Transfers: 2-To toilet/BSC: Max A (lift and lower assist);3-From toilet/BSC: Mod A (lift or lower assist)  FIM - Bed/Chair Transfer Bed/Chair Transfer Assistive Devices: Bed rails Bed/Chair Transfer: 3: Chair or W/C > Bed: Mod A (lift or lower assist);5: Sit > Supine: Supervision (verbal cues/safety issues)  FIM - Locomotion: Wheelchair Distance: 50 Locomotion: Wheelchair: 2: Travels 50 - 149 ft with moderate assistance (Pt: 50 - 74%) FIM -  Locomotion: Ambulation Locomotion: Ambulation Assistive Devices: Walker - Rolling;Other (comment) (rollator) Ambulation/Gait Assistance: 3: Mod assist;2: Max assist Locomotion: Ambulation: 2: Travels 50 - 149 ft with maximal assistance (Pt: 25 - 49%)  Comprehension Comprehension Mode: Auditory Comprehension: 5-Follows basic conversation/direction: With extra time/assistive device  Expression Expression Mode: Verbal Expression: 5-Expresses basic needs/ideas: With no assist  Social Interaction Social Interaction: 4-Interacts appropriately 75 - 89% of the time - Needs redirection for appropriate language or to initiate interaction.  Problem Solving Problem Solving: 5-Solves basic 90% of the time/requires cueing < 10% of the time  Memory Memory: 5-Recognizes or recalls 90% of the time/requires cueing < 10% of the time  Medical Problem List and Plan: 1. 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  DVT Prophylaxis/Anticoagulation: Pharmaceutical: Lovenox 3. Pain Management: Tylenol prn.   4. Depression/Mood: Willing to start antidepressant. Willing to talk to psychologist about current impairments  as well as issues regarding ex-wife.   5. Neuropsych: This patient is capable of making decisions on his own behalf. 6. Skin/Wound Care: Routine pressure relief measures. Maintain adequate hydration and nutritional status.   7. Dyslipidemia: On lipitor   LOS (Days) 3 A FACE TO FACE EVALUATION WAS PERFORMED  KIRSTEINS,ANDREW E 01/08/2014, 8:52 AM

## 2014-01-09 ENCOUNTER — Inpatient Hospital Stay (HOSPITAL_COMMUNITY): Payer: Medicare Other

## 2014-01-09 NOTE — Progress Notes (Signed)
Subjective/Complaints: 73 y.o. male with history of Hep C, familial spastic paraparesis with gait disorder, HTN, who was admitted on 12/31/13 with right sided weakness and slurred speech. Patient reported that he had difficulty turning the key or gripping the wheel in his truck for about 30 minutes then fell out of his truck while trying to get out and struck his head. EMS activated and CT head in ED negative for hemorrhage. MRI brain with patchy areas of acute infarction in the left MCA territory, predominantly involving the parietal lobe and small acute right parietal infarct. MRA brain with 4 mm abnormality along the posterior aspect of the proximal right PCA described on CTA remains indeterminate with question of partially thrombosed aneurysm. 2D echo with EF 55-60%, moderate AVS, pseudonormal left ventricular filling pattern with grade 2 diastolic dysfunction. Carotid dopplers without significant ICA stenosis.    Appetite good Bowels moved  8/21 No problems with urination  Review of Systems - Negative except both legs weak, constipation  FMH:  Has 2 sisters with problems walking Objective: Vital Signs: Blood pressure 137/69, pulse 49, temperature 98.5 F (36.9 C), temperature source Oral, resp. rate 18, weight 80.5 kg (177 lb 7.5 oz), SpO2 96.00%. No results found. No results found for this or any previous visit (from the past 72 hour(s)).   HEENT: normal Cardio: RRR and Gr 3/6 SEM Resp: CTA B/L and unlabored GI: BS positive and NT, ND Extremity:  Pulses positive and No Edema Skin:   Intact Neuro: Alert/Oriented, Cranial Nerve II-XII normal, Abnormal Sensory reduced sensation in RUE and RLE to LT, Abnormal Motor 5/5 LUE, 4/5 RUE, 3/5 BLE and Tone:  increased bilateral LE extensor tone, MAS 2-3 Musc/Skel:  Other decreased ROM bilateral Hips, Knees, ankles Gen NAD   Assessment/Plan: 1. Functional deficits secondary to Right MCA infarct which require 3+ hours per day of  interdisciplinary therapy in a comprehensive inpatient rehab setting. Physiatrist is providing close team supervision and 24 hour management of active medical problems listed below. Physiatrist and rehab team continue to assess barriers to discharge/monitor patient progress toward functional and medical goals. FIM: FIM - Bathing Bathing Steps Patient Completed: Chest;Right Arm;Left Arm;Abdomen;Right upper leg;Left upper leg Bathing: 3: Mod-Patient completes 5-7 37f 10 parts or 50-74%  FIM - Upper Body Dressing/Undressing Upper body dressing/undressing steps patient completed: Thread/unthread right sleeve of pullover shirt/dresss;Put head through opening of pull over shirt/dress;Thread/unthread left sleeve of pullover shirt/dress;Pull shirt over trunk Upper body dressing/undressing: 5: Set-up assist to: Obtain clothing/put away FIM - Lower Body Dressing/Undressing Lower body dressing/undressing steps patient completed: Thread/unthread right underwear leg;Thread/unthread left underwear leg;Thread/unthread left pants leg;Pull pants up/down;Don/Doff right sock;Don/Doff left sock;Don/Doff right shoe;Don/Doff left shoe;Pull underwear up/down;Thread/unthread right pants leg Lower body dressing/undressing: 4: Min-Patient completed 75 plus % of tasks  FIM - Toileting Toileting steps completed by patient: Performs perineal hygiene Toileting Assistive Devices: Grab bar or rail for support Toileting: 2: Max-Patient completed 1 of 3 steps  FIM - Diplomatic Services operational officer Devices: Grab bars Toilet Transfers: 4-To toilet/BSC: Min A (steadying Pt. > 75%);4-From toilet/BSC: Min A (steadying Pt. > 75%)  FIM - Bed/Chair Transfer Bed/Chair Transfer Assistive Devices: Bed rails;HOB elevated;Walker;Arm rests Bed/Chair Transfer: 4: Sit > Supine: Min A (steadying pt. > 75%/lift 1 leg);4: Supine > Sit: Min A (steadying Pt. > 75%/lift 1 leg);4: Bed > Chair or W/C: Min A (steadying Pt. > 75%);4:  Chair or W/C > Bed: Min A (steadying Pt. > 75%)  FIM - Locomotion:  Wheelchair Distance: 50 Locomotion: Wheelchair: 5: Travels 150 ft or more: maneuvers on rugs and over door sills with supervision, cueing or coaxing FIM - Locomotion: Ambulation Locomotion: Ambulation Assistive Devices: Walker - Rolling;Other (comment) (rollator) Ambulation/Gait Assistance: 3: Mod assist;2: Max assist Locomotion: Ambulation: 0: Activity did not occur  Comprehension Comprehension Mode: Auditory Comprehension: 5-Follows basic conversation/direction: With no assist  Expression Expression Mode: Verbal Expression: 5-Expresses basic needs/ideas: With no assist  Social Interaction Social Interaction: 5-Interacts appropriately 90% of the time - Needs monitoring or encouragement for participation or interaction.  Problem Solving Problem Solving: 4-Solves basic 75 - 89% of the time/requires cueing 10 - 24% of the time  Memory Memory: 4-Recognizes or recalls 75 - 89% of the time/requires cueing 10 - 24% of the time  Medical Problem List and Plan: 1. 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  DVT Prophylaxis/Anticoagulation: Pharmaceutical: Lovenox 3. Pain Management: Tylenol prn.   4. Depression/Mood: Willing to start antidepressant. Willing to talk to psychologist about current impairments  as well as issues regarding ex-wife.   5. Neuropsych: This patient is capable of making decisions on his own behalf. 6. Skin/Wound Care: Routine pressure relief measures. Maintain adequate hydration and nutritional status.   7. Dyslipidemia: On lipitor   LOS (Days) 4 A FACE TO FACE EVALUATION WAS PERFORMED  Karson Chicas E 01/09/2014, 8:12 AM

## 2014-01-09 NOTE — Progress Notes (Signed)
Physical Therapy Session Note  Patient Details  Name: Calvin Merritt MRN: [id number] Date of Birth: 08-19-1940  Today's Date: 01/09/2014 PT Individual Time: 1451-1521 PT Individual Time Calculation (min): 30 min   Short Term Goals: Week 1:  PT Short Term Goal 1 (Week 1): Patient will be independent with bed mobility of regular double bed to simulate home environment. PT Short Term Goal 2 (Week 1): Patient will perform sit to stand to rollator with close supervision PT Short Term Goal 3 (Week 1): Patient will ambulate with rollator 150 feet with min assist PT Short Term Goal 4 (Week 1): Patient will ambulate up/down ramp with min assist using rollator PT Short Term Goal 5 (Week 1): Patient will transfer bed <> wheelchair with close supervision.  Skilled Therapeutic Interventions/Progress Updates:    Pt received seated in w/c, agreeable to participate in therapy. Pt propelled w/c to rehab gym w/ Mod HOH A on R hand. Pt demonstrated improved coordination w/ R hand if given cue to look at hand each time. Noted decreased coordination if in high distraction environment or if attempting to converse while propelling w/c. Pt w/ x2 sit<>stands from w/c, max cues for hand placement and sequencing. Pt engaged in standing checkers game w/ LUE constrained in order to address RUE coordination. Pt required cueing for game rules <10% of the time, required cueing to use R hand ~50% of the time, required MinA to remain standing. Pt able to stand for ~15 minutes w/o requiring rest break. Pt asked to transfer to recliner upon arrival back to room. Pt w/ squat pivot transfer w/ MinGuard A w/c>recliner. Pt left seated in recliner w/ all needs within reach.  Therapy Documentation Precautions:  Precautions Precautions: Fall Precaution Comments: Loop recorder Restrictions Weight Bearing Restrictions: No Vital Signs: Therapy Vitals Temp: 97.9 F (36.6 C) Temp src: Oral Pulse Rate: 54 Resp: 18 BP: 154/79  mmHg Patient Position (if appropriate): Sitting Oxygen Therapy SpO2: 94 % O2 Device: None (Room air) Pain: Pain Assessment Pain Assessment: No/denies pain Pain Score: 0-No pain  See FIM for current functional status  Therapy/Group: Individual Therapy  Hosie Spangle Hosie Spangle, PT, DPT  01/09/2014, 4:37 PM

## 2014-01-09 NOTE — IPOC Note (Signed)
Overall Plan of Care [facility]) Patient Details Name: Calvin Merritt MRN: [id number] DOB: 09/28/1940  Admitting Diagnosis: B CVAS   Hospital Problems: Active Problems:   CVA (cerebral infarction)     Functional Problem List: Nursing Bladder;Bowel;Endurance;Medication Management;Pain;Safety;Sensory  PT Balance;Motor;Perception  OT Balance;Cognition;Endurance;Motor;Perception;Safety;Sensory  SLP    TR         Basic ADL's: OT Eating;Grooming;Bathing;Dressing;Toileting     Advanced  ADL's: OT Simple Meal Preparation     Transfers: PT Bed Mobility;Bed to Chair;Car  OT Toilet;Tub/Shower     Locomotion: PT Ambulation;Wheelchair Mobility;Stairs     Additional Impairments: OT Fuctional Use of Upper Extremity  SLP        TR      Anticipated Outcomes Item Anticipated Outcome  Self Feeding mod I   Swallowing      Basic self-care  supervision  Toileting  supervision   Bathroom Transfers supervision   Bowel/Bladder  Mod I  Transfers  Mod I with basic and Supervision with car transfers  Locomotion  Supervision household ambulator with assistive device  Communication     Cognition     Pain  pain less than or equal to 2  Safety/Judgment  no unsafe behavior   Therapy Plan: PT Intensity: Minimum of 1-2 x/day ,45 to 90 minutes PT Frequency: 5 out of 7 days PT Duration Estimated Length of Stay: 10 - 14 days OT Intensity: Minimum of 1-2 x/day, 45 to 90 minutes OT Frequency: 5 out of 7 days OT Duration/Estimated Length of Stay: 2 1/2-3 weeks         Team Interventions: Nursing Interventions Patient/Family Education;Pain Management;Bladder Management;Medication Management;Bowel Management  PT interventions Ambulation/gait training;Balance/vestibular training;Discharge planning;Functional mobility training;Neuromuscular re-education;Patient/family education;Stair training;Therapeutic Activities;Therapeutic Exercise;UE/LE Strength taining/ROM;UE/LE Coordination  activities;Wheelchair propulsion/positioning  OT Interventions Balance/vestibular training;Cognitive remediation/compensation;Community reintegration;Discharge planning;UE/LE Coordination activities;Neuromuscular re-education;UE/LE Strength taining/ROM;Self Care/advanced ADL retraining;Functional mobility training;Psychosocial support;Therapeutic Exercise;DME/adaptive equipment instruction;Patient/family education;Therapeutic Activities;Wheelchair propulsion/positioning  SLP Interventions    TR Interventions    SW/CM Interventions Discharge Planning;Psychosocial Support;Patient/Family Education    Team Discharge Planning: Destination: PT-Home ,OT- Home , SLP-  Projected Follow-up: PT-Outpatient PT, OT-  Outpatient OT, SLP-  Projected Equipment Needs: PT-To be determined, OT- To be determined, SLP-  Equipment Details: PT-patient has 2 rollators, OT-  Patient/family involved in discharge planning: PT- Patient,  OT-Patient, SLP-   MD ELOS: 10-14d Medical Rehab Prognosis:  Good Assessment: 73 y.o. male with history of Hep C, familial spastic paraparesis with gait disorder, HTN, who was admitted on 12/31/13 with right sided weakness and slurred speech. Patient reported that he had difficulty turning the key or gripping the wheel in his truck for about 30 minutes then fell out of his truck while trying to get out and struck his head. EMS activated and CT head in ED negative for hemorrhage. MRI brain with patchy areas of acute infarction in the left MCA territory, predominantly involving the parietal lobe and small acute right parietal infarct. MRA brain with 4 mm abnormality along the posterior aspect of the proximal right PCA described on CTA remains indeterminate with question of partially thrombosed aneurysm. 2D echo with EF 55-60%, moderate AVS, pseudonormal left ventricular filling pattern with grade 2 diastolic dysfunction  Now requiring 24/7 Rehab RN,MD, as well as CIR level PT, OT and SLP.   Treatment team will focus on ADLs and mobility with goals set at sup    See Team Conference Notes for weekly updates to the plan of care

## 2014-01-10 ENCOUNTER — Inpatient Hospital Stay (HOSPITAL_COMMUNITY): Payer: Medicare Other | Admitting: Occupational Therapy

## 2014-01-10 ENCOUNTER — Inpatient Hospital Stay (HOSPITAL_COMMUNITY): Payer: Medicare Other | Admitting: Physical Therapy

## 2014-01-10 ENCOUNTER — Encounter (HOSPITAL_COMMUNITY): Payer: Medicare Other | Admitting: Occupational Therapy

## 2014-01-10 DIAGNOSIS — I6340 Cerebral infarction due to embolism of unspecified cerebral artery: Secondary | ICD-10-CM

## 2014-01-10 DIAGNOSIS — F3289 Other specified depressive episodes: Secondary | ICD-10-CM

## 2014-01-10 DIAGNOSIS — F329 Major depressive disorder, single episode, unspecified: Secondary | ICD-10-CM

## 2014-01-10 DIAGNOSIS — Z5189 Encounter for other specified aftercare: Secondary | ICD-10-CM

## 2014-01-10 LAB — BASIC METABOLIC PANEL
ANION GAP: 11 (ref 5–15)
BUN: 25 mg/dL — ABNORMAL HIGH (ref 6–23)
CO2: 25 mEq/L (ref 19–32)
Calcium: 9.7 mg/dL (ref 8.4–10.5)
Chloride: 107 mEq/L (ref 96–112)
Creatinine, Ser: 1.4 mg/dL — ABNORMAL HIGH (ref 0.50–1.35)
GFR, EST AFRICAN AMERICAN: 56 mL/min — AB (ref >90)
GFR, EST NON AFRICAN AMERICAN: 48 mL/min — AB (ref >90)
GLUCOSE: 98 mg/dL (ref 70–99)
POTASSIUM: 4.8 meq/L (ref 3.7–5.3)
Sodium: 143 mEq/L (ref 137–147)

## 2014-01-10 MED ORDER — DOCUSATE SODIUM 100 MG PO CAPS
100.0000 mg | ORAL_CAPSULE | Freq: Every day | ORAL | Status: DC
  Filled 2014-01-10 (×5): qty 1

## 2014-01-10 NOTE — Progress Notes (Addendum)
Occupational Therapy Session Note  Patient Details  Name: Calvin Merritt MRN: [id number] Date of Birth: 1941/03/13  Today's Date: 01/10/2014 OT Individual Time: 1300-1330 OT Individual Time Calculation (min): 30 min  Short Term Goals: Week 1:  OT Short Term Goal 1 (Week 1): Pt will self feed with right hand with build up utensil with setup with extra time for 50% of meal OT Short Term Goal 2 (Week 1): Pt will transfer stand pivot to toilet with min A  OT Short Term Goal 3 (Week 1): Pt will perform clothing management with bilateral UEs with steady A in standing  OT Short Term Goal 4 (Week 1): Pt will demonstrate bed mobility in prep for ADL without UE support with min A with extra time  Skilled Therapeutic Interventions/Progress Updates:  Patient resting in w/c upon arrival with QRB in place.  Patient propelled w/c to therapy gym using BUEs & BLEs, then practiced just using his BLE then practiced propelling himself backwards using only BLEs.  Patient required mod-max focus and verbal cues for technique, distractibility and to visually watch his RLE while it is working.  Once in therapy gym-patient practiced w/c><mat squat pivot transfers, sit><stand, static and dynamic standing balance, forced use of RLE & RUE, lateral and and anterior weight shifts and slow, controlled transitional movements.  Patient seemed to benefit from full length mirror and his walker in front of him due to not trusting himself and unsure of this OT secondary to this is is first time working with me.  Once patient developed trust in himself and me, he was able to perform tasks with less anxiety.  Therapy Documentation Precautions:  Precautions Precautions: Fall Precaution Comments: Loop recorder Restrictions Weight Bearing Restrictions: No Pain: No report of pain  Therapy/Group: Individual Therapy  Shaun Zuccaro 01/10/2014, 4:55 PM

## 2014-01-10 NOTE — Progress Notes (Signed)
Occupational Therapy Session Note  Patient Details  Name: Calvin Merritt MRN: [id number] Date of Birth: March 22, 1941  Today's Date: 01/10/2014 OT Individual Time: 0905-0950 OT Individual Time Calculation (min): 45 min   Short Term Goals: Week 1:  OT Short Term Goal 1 (Week 1): Pt will self feed with right hand with build up utensil with setup with extra time for 50% of meal OT Short Term Goal 2 (Week 1): Pt will transfer stand pivot to toilet with min A  OT Short Term Goal 3 (Week 1): Pt will perform clothing management with bilateral UEs with steady A in standing  OT Short Term Goal 4 (Week 1): Pt will demonstrate bed mobility in prep for ADL without UE support with min A with extra time  Skilled Therapeutic Interventions/Progress Updates:    Engaged in ADL retraining with focus on transfers, sit > stand, and functional use of RUE during self-care tasks.  Upon arrival, pt reports need to toilet but unwilling to ambulate with RW to toilet.  Performed squat pivot with grab bars with mod verbal cues for sequencing and safety.  Bathing completed at sit > stand level with mod assist to maintain standing, education on hand placement with sit> stand and use of RUE.  Educated on visual attention to RUE during tasks to increase coordination and functional use.  Pt with recall of education on hemi-dressing technique with LB dressing.  Discussed goals established as pt with questions regarding d/c plan as he wants to go home asap.  Therapy Documentation Precautions:  Precautions Precautions: Fall Precaution Comments: Loop recorder Restrictions Weight Bearing Restrictions: No Pain: Pain Assessment Pain Assessment: No/denies pain Pain Score: 0-No pain ADL: ADL ADL Comments: see FIM  See FIM for current functional status  Therapy/Group: Individual Therapy  Rosalio Loud 01/10/2014, 10:56 AM

## 2014-01-10 NOTE — Progress Notes (Addendum)
Physical Therapy Session Note  Patient Details  Name: Calvin Merritt MRN: [id number] Date of Birth: [birth year]  Today's Date: 01/10/2014 PT Individual Time: 1100-1203 and 1478-2956 PT Individual Time Calculation (min): 63 min and 51 min  Short Term Goals: Week 1:  PT Short Term Goal 1 (Week 1): Patient will be independent with bed mobility of regular double bed to simulate home environment. PT Short Term Goal 2 (Week 1): Patient will perform sit to stand to rollator with close supervision PT Short Term Goal 3 (Week 1): Patient will ambulate with rollator 150 feet with min assist PT Short Term Goal 4 (Week 1): Patient will ambulate up/down ramp with min assist using rollator PT Short Term Goal 5 (Week 1): Patient will transfer bed <> wheelchair with close supervision.  Skilled Therapeutic Interventions/Progress Updates:    Treatment Session 1: Pt received seated in w/c with quick release belt on; agreeable to therapy. Session focused on functional transfers, NMR, and gait training. Pt performed w/c mobility x160' in controlled and home environments with bilat UE's and min A. Added Theraband to R w/c hand rim after initial 50' to improve R hand grip, increase pt attention to RUE position of hand rim. Pt with improved accuracy of hand placement but did require continued verbal cueing to visually attend to R hand to ensure proper placement.  Performed gait trials 6 x8' with bilat UE support at parallel bars with min guard-min A, multimodal cueing for widened BOS and upright posture, forward gaze. Performed gait x55' in controlled environment with rolling walker (weighted with 6 lbs to for increased control) with min guard-min A with +2A for w/c follow secondary to gait instability. Gait pattern characterized by decreased control of RLE during swing, limited R ankle inversion/plantarflexion during RUE initial contact (appears to be secondary to activation of R tibialis posterior vs. decreased selective  control of R ankle dorsiflexion with concurrent knee extension). Will continue to assess.   NMR focused on transitional movements for motor control, grading of movement, and anterior weight shifting. Performed multiple trials of sit<>stand transfers with UE support; tactile cueing at L ribcage, R posterior pelvis to emphasize erect trunk flexion; multimodal cueing focused on slow, controlled performance of transfer. Verbal/demonstration cueing addressed transfer setup/technique, safe use of rolling walker.  Treatment Session 2: Pt received seated in w/c with quick release belt on; agreeable to therapy. Session focused on increasing pt independence with transfers, gait, and w/c parts management. Pt performed w/c mobility x200' in controlled environment with bilat UE's and supervision, increased time. In ortho gym, pt performed squat pivot transfers from w/c<>mat table with supervision with between-session carryover of 75% of cueing for transfer setup. Verbal/demonstration cueing focused on management of w/c arm and leg rests. Performed multiple sit<>stand transfers from elevated mat table with close supervision to min A and effective between-session carryover of 50% of cueing for setup, technique. Applied ACE bandage to RLE to maintain dorsiflexion from RLE advancement through initial contact. Performed gait 3 x11' in controlled environment with rolling walker (weighted with 6 lbs) requiring min-mod A, multimodal cueing as described above, and mirror positioned anterior to pt to increase visual feedback, address perceptual impairments. Session ended in pt room, where pt was left seated in recliner with quick release belt on for safety and all needs within reach.  Therapy Documentation Precautions:  Precautions Precautions: Fall Precaution Comments: Loop recorder Restrictions Weight Bearing Restrictions: No Pain: Pain Assessment Pain Assessment: No/denies pain Pain Score: 0-No pain Mobility:  Locomotion : Ambulation Ambulation/Gait Assistance: 1: +2 Total assist (+2A for w/c follow) Wheelchair Mobility Distance: 160   See FIM for current functional status  Therapy/Group: Individual Therapy  Hobble, Lorenda Ishihara 01/10/2014, 12:27 PM

## 2014-01-10 NOTE — Progress Notes (Signed)
Subjective/Complaints: 73 y.o. male with history of Hep C, familial spastic paraparesis with gait disorder, HTN, who was admitted on 12/31/13 with right sided weakness and slurred speech. Patient reported that he had difficulty turning the key or gripping the wheel in his truck for about 30 minutes then fell out of his truck while trying to get out and struck his head. EMS activated and CT head in ED negative for hemorrhage. MRI brain with patchy areas of acute infarction in the left MCA territory, predominantly involving the parietal lobe and small acute right parietal infarct. MRA brain with 4 mm abnormality along the posterior aspect of the proximal right PCA described on CTA remains indeterminate with question of partially thrombosed aneurysm. 2D echo with EF 55-60%, moderate AVS, pseudonormal left ventricular filling pattern with grade 2 diastolic dysfunction. Carotid dopplers without significant ICA stenosis.    Appetite good Bowels moved x 2 on 8/23, incont secondary to urgency, loose stools No problems with urination  Review of Systems - Negative except both legs weak, constipation  FMH:  Has 2 sisters with problems walking Objective: Vital Signs: Blood pressure 151/81, pulse 54, temperature 98.1 F (36.7 C), temperature source Oral, resp. rate 18, weight 80.5 kg (177 lb 7.5 oz), SpO2 93.00%. No results found. No results found for this or any previous visit (from the past 72 hour(s)).   HEENT: normal Cardio: RRR and Gr 3/6 SEM Resp: CTA B/L and unlabored GI: BS positive and NT, ND Extremity:  Pulses positive and No Edema Skin:   Intact Neuro: Alert/Oriented, Cranial Nerve II-XII normal, Abnormal Sensory reduced sensation in RUE and RLE to LT, Abnormal Motor 5/5 LUE, 4/5 RUE, 3/5 BLE and Tone:  increased bilateral LE extensor tone, MAS 2-3 Musc/Skel:  Other decreased ROM bilateral Hips, Knees, ankles Gen NAD   Assessment/Plan: 1. Functional deficits secondary to Right MCA  infarct which require 3+ hours per day of interdisciplinary therapy in a comprehensive inpatient rehab setting. Physiatrist is providing close team supervision and 24 hour management of active medical problems listed below. Physiatrist and rehab team continue to assess barriers to discharge/monitor patient progress toward functional and medical goals. FIM: FIM - Bathing Bathing Steps Patient Completed: Chest;Right Arm;Left Arm;Abdomen;Right upper leg;Left upper leg Bathing: 3: Mod-Patient completes 5-7 76f 10 parts or 50-74%  FIM - Upper Body Dressing/Undressing Upper body dressing/undressing steps patient completed: Thread/unthread right sleeve of pullover shirt/dresss;Put head through opening of pull over shirt/dress;Thread/unthread left sleeve of pullover shirt/dress;Pull shirt over trunk Upper body dressing/undressing: 5: Set-up assist to: Obtain clothing/put away FIM - Lower Body Dressing/Undressing Lower body dressing/undressing steps patient completed: Thread/unthread right underwear leg;Thread/unthread left underwear leg;Thread/unthread left pants leg;Pull pants up/down;Don/Doff right sock;Don/Doff left sock;Don/Doff right shoe;Don/Doff left shoe;Pull underwear up/down;Thread/unthread right pants leg Lower body dressing/undressing: 4: Min-Patient completed 75 plus % of tasks  FIM - Toileting Toileting steps completed by patient: Performs perineal hygiene Toileting Assistive Devices: Grab bar or rail for support Toileting: 2: Max-Patient completed 1 of 3 steps  FIM - Diplomatic Services operational officer Devices: Grab bars Toilet Transfers: 4-To toilet/BSC: Min A (steadying Pt. > 75%);4-From toilet/BSC: Min A (steadying Pt. > 75%)  FIM - Bed/Chair Transfer Bed/Chair Transfer Assistive Devices: Bed rails;HOB elevated;Walker;Arm rests Bed/Chair Transfer: 0: Activity did not occur  FIM - Locomotion: Wheelchair Distance: 50 Locomotion: Wheelchair: 3: Travels 150 ft or more:  maneuvers on rugs and over door sills with moderate assistance  (Pt: 50 - 74%) FIM - Locomotion: Ambulation Locomotion: Ambulation  Assistive Devices: Walker - Rolling;Other (comment) (rollator) Ambulation/Gait Assistance: 3: Mod assist;2: Max assist Locomotion: Ambulation: 0: Activity did not occur  Comprehension Comprehension Mode: Auditory Comprehension: 5-Follows basic conversation/direction: With extra time/assistive device  Expression Expression Mode: Verbal Expression: 5-Expresses basic 90% of the time/requires cueing < 10% of the time.  Social Interaction Social Interaction: 5-Interacts appropriately 90% of the time - Needs monitoring or encouragement for participation or interaction.  Problem Solving Problem Solving: 4-Solves basic 75 - 89% of the time/requires cueing 10 - 24% of the time  Memory Memory: 4-Recognizes or recalls 75 - 89% of the time/requires cueing 10 - 24% of the time  Medical Problem List and Plan: 1. 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  DVT Prophylaxis/Anticoagulation: Pharmaceutical: Lovenox 3. Pain Management: Tylenol prn.   4. Depression/Mood: Willing to start antidepressant. Willing to talk to psychologist about current impairments  as well as issues regarding ex-wife.   5. Neuropsych: This patient is capable of making decisions on his own behalf. 6. Skin/Wound Care: Routine pressure relief measures. Maintain adequate hydration and nutritional status.   7. Dyslipidemia: On lipitor 8.  Bowel incont secondary to laxatives, will change senna to colace  LOS (Days) 5 A FACE TO FACE EVALUATION WAS PERFORMED  KIRSTEINS,ANDREW E 01/10/2014, 7:43 AM

## 2014-01-11 ENCOUNTER — Inpatient Hospital Stay (HOSPITAL_COMMUNITY): Payer: Medicare Other | Admitting: Physical Therapy

## 2014-01-11 ENCOUNTER — Inpatient Hospital Stay (HOSPITAL_COMMUNITY): Payer: Medicare Other | Admitting: Speech Pathology

## 2014-01-11 ENCOUNTER — Encounter (HOSPITAL_COMMUNITY): Payer: Medicare Other | Admitting: Occupational Therapy

## 2014-01-11 DIAGNOSIS — F329 Major depressive disorder, single episode, unspecified: Secondary | ICD-10-CM

## 2014-01-11 DIAGNOSIS — Z5189 Encounter for other specified aftercare: Secondary | ICD-10-CM

## 2014-01-11 DIAGNOSIS — F3289 Other specified depressive episodes: Secondary | ICD-10-CM

## 2014-01-11 DIAGNOSIS — I6340 Cerebral infarction due to embolism of unspecified cerebral artery: Secondary | ICD-10-CM

## 2014-01-11 MED ORDER — AMLODIPINE BESYLATE 5 MG PO TABS
5.0000 mg | ORAL_TABLET | Freq: Every day | ORAL | Status: DC
  Administered 2014-01-11 – 2014-01-17 (×7): 5 mg via ORAL
  Filled 2014-01-11 (×8): qty 1

## 2014-01-11 NOTE — Progress Notes (Signed)
Subjective/Complaints: 73 y.o. male with history of Hep C, familial spastic paraparesis with gait disorder, HTN, who was admitted on 12/31/13 with right sided weakness and slurred speech. Patient reported that he had difficulty turning the key or gripping the wheel in his truck for about 30 minutes then fell out of his truck while trying to get out and struck his head. EMS activated and CT head in ED negative for hemorrhage. MRI brain with patchy areas of acute infarction in the left MCA territory, predominantly involving the parietal lobe and small acute right parietal infarct. MRA brain with 4 mm abnormality along the posterior aspect of the proximal right PCA described on CTA remains indeterminate with question of partially thrombosed aneurysm. 2D echo with EF 55-60%, moderate AVS,with grade 2 diastolic dysfunction. Carotid dopplers without significant ICA stenosis.    Concerned about BP elevation, no HA  No problems with urination  Review of Systems - Negative except both legs weak, constipation   Objective: Vital Signs: Blood pressure 147/74, pulse 52, temperature 97.9 F (36.6 C), temperature source Oral, resp. rate 18, weight 80.5 kg (177 lb 7.5 oz), SpO2 94.00%. No results found. Results for orders placed during the hospital encounter of 01/05/14 (from the past 72 hour(s))  BASIC METABOLIC PANEL     Status: Abnormal   Collection Time    01/10/14  7:06 AM      Result Value Ref Range   Sodium 143  137 - 147 mEq/L   Potassium 4.8  3.7 - 5.3 mEq/L   Chloride 107  96 - 112 mEq/L   CO2 25  19 - 32 mEq/L   Glucose, Bld 98  70 - 99 mg/dL   BUN 25 (*) 6 - 23 mg/dL   Creatinine, Ser 1.40 (*) 0.50 - 1.35 mg/dL   Calcium 9.7  8.4 - 10.5 mg/dL   GFR calc non Af Amer 48 (*) >90 mL/min   GFR calc Af Amer 56 (*) >90 mL/min   Comment: (NOTE)     The eGFR has been calculated using the CKD EPI equation.     This calculation has not been validated in all clinical situations.     eGFR's  persistently <90 mL/min signify possible Chronic Kidney     Disease.   Anion gap 11  5 - 15     HEENT: normal Cardio: RRR and Gr 3/6 SEM Resp: CTA B/L and unlabored GI: BS positive and NT, ND Extremity:  Pulses positive and No Edema Skin:   Intact Neuro: Alert/Oriented, Cranial Nerve II-XII normal, Abnormal Sensory reduced sensation in RUE and RLE to LT, Abnormal Motor 5/5 LUE, 4/5 RUE, 3/5 BLE and Tone:  increased bilateral LE extensor tone, MAS 2-3 Musc/Skel:  Other decreased ROM bilateral Hips, Knees, ankles Gen NAD   Assessment/Plan: 1. Functional deficits secondary to Right MCA infarct which require 3+ hours per day of interdisciplinary therapy in a comprehensive inpatient rehab setting. Physiatrist is providing close team supervision and 24 hour management of active medical problems listed below. Physiatrist and rehab team continue to assess barriers to discharge/monitor patient progress toward functional and medical goals. FIM: FIM - Bathing Bathing Steps Patient Completed: Chest;Right Arm;Left Arm;Abdomen;Right upper leg;Left upper leg;Buttocks;Right lower leg (including foot);Left lower leg (including foot) Bathing: 3: Mod-Patient completes 5-7 34f 10 parts or 50-74%  FIM - Upper Body Dressing/Undressing Upper body dressing/undressing steps patient completed: Thread/unthread right sleeve of pullover shirt/dresss;Put head through opening of pull over shirt/dress;Thread/unthread left sleeve of pullover shirt/dress;Pull  shirt over trunk;Thread/unthread right sleeve of front closure shirt/dress;Thread/unthread left sleeve of front closure shirt/dress;Pull shirt around back of front closure shirt/dress Upper body dressing/undressing: 5: Set-up assist to: Obtain clothing/put away FIM - Lower Body Dressing/Undressing Lower body dressing/undressing steps patient completed: Thread/unthread right underwear leg;Thread/unthread left underwear leg;Thread/unthread left pants leg;Pull pants  up/down;Don/Doff right sock;Don/Doff left sock;Don/Doff right shoe;Don/Doff left shoe;Pull underwear up/down;Thread/unthread right pants leg;Fasten/unfasten right shoe;Fasten/unfasten left shoe Lower body dressing/undressing: 3: Mod-Patient completed 50-74% of tasks  FIM - Toileting Toileting steps completed by patient: Performs perineal hygiene Toileting Assistive Devices: Grab bar or rail for support Toileting: 2: Max-Patient completed 1 of 3 steps  FIM - Radio producer Devices: Grab bars Toilet Transfers: 3-To toilet/BSC: Mod A (lift or lower assist);3-From toilet/BSC: Mod A (lift or lower assist)  FIM - Engineer, site Assistive Devices: Arm rests Bed/Chair Transfer: 5: Bed > Chair or W/C: Supervision (verbal cues/safety issues);5: Chair or W/C > Bed: Supervision (verbal cues/safety issues)  FIM - Locomotion: Wheelchair Distance: 200 Locomotion: Wheelchair: 5: Travels 150 ft or more: maneuvers on rugs and over door sills with supervision, cueing or coaxing FIM - Locomotion: Ambulation Locomotion: Ambulation Assistive Devices: Walker - Rolling;Other (comment) (ACE bandage for R ankle dorsiflexion) Ambulation/Gait Assistance: 3: Mod assist Locomotion: Ambulation: 1: Travels less than 50 ft with moderate assistance (Pt: 50 - 74%)  Comprehension Comprehension Mode: Auditory Comprehension: 5-Follows basic conversation/direction: With extra time/assistive device  Expression Expression Mode: Verbal Expression: 5-Expresses complex 90% of the time/cues < 10% of the time  Social Interaction Social Interaction: 4-Interacts appropriately 75 - 89% of the time - Needs redirection for appropriate language or to initiate interaction.  Problem Solving Problem Solving: 4-Solves basic 75 - 89% of the time/requires cueing 10 - 24% of the time  Memory Memory: 4-Recognizes or recalls 75 - 89% of the time/requires cueing 10 - 24% of the  time  Medical Problem List and Plan: 1. 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  DVT Prophylaxis/Anticoagulation: Pharmaceutical: Lovenox 3. Pain Management: Tylenol prn.   4. Depression/Mood: Willing to start antidepressant. Willing to talk to psychologist about current impairments  as well as issues regarding ex-wife.   5. Neuropsych: This patient is capable of making decisions on his own behalf. 6. Skin/Wound Care: Routine pressure relief measures. Maintain adequate hydration and nutritional status.   7. Dyslipidemia: On lipitor 8.  Bowel incont secondary to laxatives, will change senna to colace 9.  HTN- was on lotensin, furosemide and amlodipine at home, meds held in acute care, BPs fluctuating but up to 180/90 yesterday, will resume amlodipine at a lower dose, hold off on lotensin and furosemide (inc Creat), monitor LOS (Days) 6 A FACE TO FACE EVALUATION WAS PERFORMED  Calvin Merritt E 01/11/2014, 7:58 AM

## 2014-01-11 NOTE — Progress Notes (Addendum)
Physical Therapy Session Note  Patient Details  Name: Calvin Merritt MRN: [id number] Date of Birth: 1940/09/15  Today's Date: 01/11/2014 PT Individual Time: 1304-1405 PT Individual Time Calculation (min): 61 min   Short Term Goals: Week 1:  PT Short Term Goal 1 (Week 1): Patient will be independent with bed mobility of regular double bed to simulate home environment. PT Short Term Goal 2 (Week 1): Patient will perform sit to stand to rollator with close supervision PT Short Term Goal 3 (Week 1): Patient will ambulate with rollator 150 feet with min assist PT Short Term Goal 4 (Week 1): Patient will ambulate up/down ramp with min assist using rollator PT Short Term Goal 5 (Week 1): Patient will transfer bed <> wheelchair with close supervision.  Skilled Therapeutic Interventions/Progress Updates:    1:1. Pt received seated in w/c; agreeable to therapy. Session focused on gait training. Pt performed w/c mobility x180' in controlled environment with bilat UE's and supervision, effective between-session carryover of RUE attention during w/c propulsion. Performed multiple sit<>stand transfers with min A, tactile cueing at R knee, verbal cueing for safe hand placement with inconsistent within-session carryover. See below for detailed description of gait training interventions performed using LiteGait. Following LiteGait, pt performed gait x30' in controlled environment with rolling walker weighted with 6 lbs requiring min-mod A for gait stability, +2A for close w/c follow for safety secondary to gait instability. Pt with improved lateral weight shift to L side, as compared with previous sessions; however, pt continues to demonstrate RLE adduction during gait. Explained, demonstrated self-stretch of R hamstrings and gastrocnemius to increase gait stability. Pt verbalized understanding. Session ended in pt room, where pt was left seated in w/c with all needs within reach.  Therapy  Documentation Precautions:  Precautions Precautions: Fall Precaution Comments: Loop recorder Restrictions Weight Bearing Restrictions: No Pain:  Pt reported no pain during PT session. Gait Training: Gait training performed using LiteGait over treadmill with harness biased to increase lateral weight shift to L side to facilitate RLE advancement/clearance.  Pt ambulated x303' total on treadmill with tactile cueing provided at R ankle dorsiflexors during RLE advancement; tactile cueing at R gluteus maximus during RLE stance phase to decrease hip retraction. Manual facilitation provided for the following: lateral weight shift to L side, R knee extension/R ankle dorsiflexion during R heel strike. Treadmill speed: .4-.5; Time: 10:19 (rest breaks x4 to adjust harness, provide feedback).  Addendum: Upgraded long term goals addressing w/c mobility to modified independent level due to pt progress.  See FIM for current functional status  Therapy/Group: Individual Therapy  Rylan Kaufmann, Lorenda Ishihara 01/11/2014, 7:28 PM

## 2014-01-11 NOTE — Progress Notes (Signed)
Occupational Therapy Session Note  Patient Details  Name: Calvin Merritt MRN: [id number] Date of Birth: June 19, 1940  Today's Date: 01/11/2014 OT Individual Time: 1191-4782 OT Individual Time Calculation (min): 60 min   Short Term Goals: Week 1:  OT Short Term Goal 1 (Week 1): Pt will self feed with right hand with build up utensil with setup with extra time for 50% of meal OT Short Term Goal 2 (Week 1): Pt will transfer stand pivot to toilet with min A  OT Short Term Goal 3 (Week 1): Pt will perform clothing management with bilateral UEs with steady A in standing  OT Short Term Goal 4 (Week 1): Pt will demonstrate bed mobility in prep for ADL without UE support with min A with extra time  Skilled Therapeutic Interventions/Progress Updates:    Engaged in ADL retraining with focus on safety with transfers, sit <> stand, and functional use of RUE during self-care tasks of bathing and dressing.  Toilet transfer from w/c with use of squat pivot with pt requiring mod assist secondary to impulsiveness with quick movements.  Educated on proper foot placement in preparation for transfer.  Ambulated to walk-in shower with RW with mod assist for turn to sit on shower chair.  Pt required constant min/steady assist even during sitting due to impulsivity and unsafe in shower environment.  Pt dropped wash cloth x3 during bathing when using RUE however no awareness of dropped cloth with continuing to wash body with just hand.  Educated pt on visually attending to RUE during all functional tasks.  Provided steady assist when standing to pull up pants, also mod verbal cues for sequencing and orientation of shirt and pants as pt with difficulty threading Rt leg through wrong leg hole x2 and threading RUE through head hole.  Pt required increased time to tie shoes, however was successful, discussed use of elastic shoelaces or shoe button.  Pt willing to attempt AD for shoes during next ADL session.  Pt discussed  possibility of niece moving in with him. Educated pt on need for supervision and possibility min assist with occasional tasks and niece would need to be willing and able to assist.  Pt continued to focus on her need of a place to live and glossed over need for supervision/assist.  Also of note, pt called her his niece but then said it was his son's daughter - "doesn't that make her my niece?"  Therapy Documentation Precautions:  Precautions Precautions: Fall Precaution Comments: Loop recorder Restrictions Weight Bearing Restrictions: No Pain:  Pt with no c/o pain ADL: ADL ADL Comments: see FIM  See FIM for current functional status  Therapy/Group: Individual Therapy  Rosalio Loud 01/11/2014, 10:26 AM

## 2014-01-11 NOTE — Evaluation (Signed)
Speech Language Pathology Assessment and Plan  Patient Details  Name: Calvin Merritt MRN: [id number] Date of Birth: Feb 14, 1941  SLP Diagnosis: N/A Rehab Potential: N/A ELOS: N/A   Today's Date: 01/11/2014 SLP Individual Time: 1000-1100 SLP Individual Time Calculation (min): 60 min   Problem List:  Patient Active Problem List   Diagnosis Date Noted  . 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 Depression 01/05/2014  . PFO (patent foramen ovale) 01/05/2014  . Aortic valve stenosis, moderate 01/05/2014  . CVA (cerebral infarction) 01/05/2014  . Cerebral embolism with cerebral infarction 12/31/2013  . Health care maintenance 08/21/2013  . HIP FRACTURE, LEFT 11/14/2009  . CERUMEN IMPACTION, BILATERAL 07/27/2008  . 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 OBSTRUCTIVE SLEEP APNEA 06/25/2007  . HYPERLIPIDEMIA 05/19/2007  . HYPERTENSION 05/19/2007   Past Medical History:  Past Medical History  Diagnosis Date  . Hyperlipidemia   . Hypertension   . Spastic paraparesis   . 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 Acetabular fracture     left  . Diverticulosis   . PFO (patent foramen ovale) 01/05/2014  . 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 Aortic valve stenosis, moderate 01/05/2014   Past Surgical History:  Past Surgical History  Procedure Laterality Date  . Nephrectomy Right 11/05  . Hip surgery  05/11    L-acetabular fracture  . Tee without cardioversion N/A 01/04/2014    Procedure: TRANSESOPHAGEAL ECHOCARDIOGRAM (TEE);  Surgeon: Larey Dresser, MD;  Location: [facility] ENDOSCOPY;  Service: Cardiovascular;  Laterality: N/A;    Assessment / Plan / Recommendation Clinical Impression 73 y.o. male with history of Hep C, familial spastic paraparesis with gait disorder, HTN, who was admitted on 12/31/13 with right sided weakness and slurred speech. Patient reported that he had  difficulty turning the key or gripping the wheel in his truck for about 30 minutes then fell out of his truck while trying to get out and struck his head. EMS activated and CT head in ED negative for hemorrhage. MRI brain with patchy areas of acute infarction in the left MCA territory, predominantly involving the parietal lobe and small acute right parietal infarct. MRA brain with 4 mm abnormality along the posterior aspect of the proximal right PCA described on CTA remains indeterminate with question of partially thrombosed aneurysm. 2D echo with EF 55-60%, moderate AVS,with grade 2 diastolic dysfunction. Carotid dopplers without significant ICA stenosis. Neurology recommends ASA for embolic stroke of unknown source. TEE with severely calcified aortic valve with moderate AS, EF 55-60%, small PFO, bubbles cross with valsalva and grade III plaque in descending thoracic aorta. BLE dopplers without DVT. Family reported that patient with stress as well as depressed mood due to pending divorce and recommended psych follow up but patient has declined this. Loop recorder to be placed today. He has had improvement in RUE strength but continues to have ataxia RUE/RLE, decreased balance as well as poor safety awareness. MD and Rehab team recommended CIR and patient admitted 01/05/14. A cognitive linguistic evaluation was administered today due to questionable cognitive impairments observed by PT/OT. Patient's overall cognitive-linguistic function appeared to be at his baseline level of functioning and was overall WFL with the exception of intermittent impulsivity with functional tasks, however, suspect this is also his baseline, therefore, skilled SLP intervention is not warranted at this time. Patient verbalized understanding of all information and is in agreement.  Skilled Therapeutic Interventions          Administered a cognitive-linguistic evaluation. Please see above for details. Educated the patient on his current  cognitive-linguistic function and strategies to utilize to increase overall safety with functional tasks, he verbalized understanding.   SLP Assessment  Patient does not need any further Speech Lanaguage Pathology Services    Recommendations  Oral Care Recommendations: Oral care BID Patient destination: Home Follow up Recommendations: None Equipment Recommended: None recommended by SLP    SLP Frequency N/A  SLP Treatment/Interventions N/A   Pain No/Denies Pain  Short Term Goals: N/A   See FIM for current functional status Refer to Care Plan for Long Term Goals  Recommendations for other services: Neuropsych  Discharge Criteria: Patient will be discharged from SLP if patient refuses treatment 3 consecutive times without medical reason, if treatment goals not met, if there is a change in medical status, if patient makes no progress towards goals or if patient is discharged from hospital.  The above assessment, treatment plan, treatment alternatives and goals were discussed and mutually agreed upon: by patient  Jaye Saal 01/11/2014, 2:10 PM

## 2014-01-12 ENCOUNTER — Inpatient Hospital Stay (HOSPITAL_COMMUNITY): Payer: Medicare Other | Admitting: Physical Therapy

## 2014-01-12 ENCOUNTER — Inpatient Hospital Stay (HOSPITAL_COMMUNITY): Payer: Medicare Other | Admitting: Speech Pathology

## 2014-01-12 ENCOUNTER — Encounter (HOSPITAL_COMMUNITY): Payer: Medicare Other | Admitting: Occupational Therapy

## 2014-01-12 LAB — CREATININE, SERUM
CREATININE: 1.37 mg/dL — AB (ref 0.50–1.35)
GFR calc Af Amer: 57 mL/min — ABNORMAL LOW (ref >90)
GFR, EST NON AFRICAN AMERICAN: 50 mL/min — AB (ref >90)

## 2014-01-12 MED ORDER — BACLOFEN 10 MG PO TABS
10.0000 mg | ORAL_TABLET | Freq: Three times a day (TID) | ORAL | Status: DC
  Administered 2014-01-12 – 2014-01-22 (×29): 10 mg via ORAL
  Filled 2014-01-12 (×33): qty 1

## 2014-01-12 NOTE — Patient Care Conference (Signed)
Inpatient RehabilitationTeam Conference and Plan of Care Update Date: 01/12/2014   Time: 11;50 AM    Patient Name: Calvin Merritt      Medical Record Number: [id number]  Date of Birth: 1940/10/24 Sex: Male         Room/Bed: 4W23C/4W23C-01 Payor Info: Payor: [employer]vertising copywriter MEDICARE / Plan: A[employer] MEDICARE COMPLETE / Product Type: *No Product type* /    Admitting Diagnosis: B CVAS   Admit Date/Time:  01/05/2014  8:29 PM Admission Comments: No comment available   Primary Diagnosis:  <principal problem not specified> Principal Problem: <principal problem not specified>  Patient Active Problem List   Diagnosis Date Noted  . 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 Depression 01/05/2014  . PFO (patent foramen ovale) 01/05/2014  . Aortic valve stenosis, moderate 01/05/2014  . CVA (cerebral infarction) 01/05/2014  . Cerebral embolism with cerebral infarction 12/31/2013  . Health care maintenance 08/21/2013  . HIP FRACTURE, LEFT 11/14/2009  . CERUMEN IMPACTION, BILATERAL 07/27/2008  . 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 OBSTRUCTIVE SLEEP APNEA 06/25/2007  . HYPERLIPIDEMIA 05/19/2007  . HYPERTENSION 05/19/2007    Expected Discharge Date: Expected Discharge Date: 01/22/14  Team Members Present: Physician leading conference: Dr. Claudette Laws Social Worker Present: Dossie Der, LCSW Nurse Present: Carmie End, RN PT Present: Wanda Plump, PT;Blair Hobble, PT OT Present: Rosalio Loud, OT SLP Present: Jackalyn Lombard, SLP PPS Coordinator present : Tora Duck, RN, CRRN     Current Status/Progress Goal Weekly Team Focus  Medical   increased flexor tone LEs R>L  improve safety  address tone   Bowel/Bladder     Cont B & B        Swallow/Nutrition/ Hydration     WFL        ADL's   min-mod assist self-care tasks, min assist squat pivot transfers, mod assist transfers with RW  supervision overall  RUE NMR, transfers, safety   Mobility   Min A with transfers, supervision-Min A  with w/c mobility, +2A with gait for safety  Supervision overall with excpetion of min A gait  RUE/LE coordination, motor control, neurological tone, safety awareness, functional transfers, w/c mangement/propulsion   Communication     [facility]: Lambert Rhonda W        Safety/Cognition/ Behavioral Observations  At baseline, Cog-ling eval administered and no SLP needs at this time   N/A  education complete with patient    Pain     less than 3 managed   managed pain levels     Skin     monitor skin currently no skin issues           *See Care Plan and progress notes for long and short-term goals.  Barriers to Discharge: premorbid spastic paraplegic    Possible Resolutions to Barriers:  see above cont rehab    Discharge Planning/Teaching Needs:  Home with intermittient assist of son, aware of NH option, but doesn't want to pursue this at this time.      Team Discussion:  Goals set at supervision level due to safety and impulsive.  BP issues MD monitoring and starting back on one of his HTN meds.  Gait is unpredictable-reason for supervision level.  Pt needs to decide what he wants to do.  Revisions to Treatment Plan:  SPT eval and cleared discharged.    Continued Need for Acute Rehabilitation Level of Care: The patient requires daily medical management by a physician with specialized training in physical medicine and rehabilitation for the following conditions: Daily direction  of a multidisciplinary physical rehabilitation program to ensure safe treatment while eliciting the highest outcome that is of practical value to the patient.: Yes Daily medical management of patient stability for increased activity during participation in an intensive rehabilitation regime.: Yes Daily analysis of laboratory values and/or radiology reports with any subsequent need for medication adjustment of medical intervention for : Neurological problems  Briceyda Abdullah, Lemar Livings 01/13/2014, 1:38 PM

## 2014-01-12 NOTE — Progress Notes (Signed)
Physical Therapy Session Note  Patient Details  Name: Calvin Merritt MRN: [id number] Date of Birth: 10-19-1940  Today's Date: 01/12/2014 PT Individual Time: 1510-1600 PT Individual Time Calculation (min): 50 min   Short Term Goals: Week 1:  PT Short Term Goal 1 (Week 1): Patient will be independent with bed mobility of regular double bed to simulate home environment. PT Short Term Goal 2 (Week 1): Patient will perform sit to stand to rollator with close supervision PT Short Term Goal 3 (Week 1): Patient will ambulate with rollator 150 feet with min assist PT Short Term Goal 4 (Week 1): Patient will ambulate up/down ramp with min assist using rollator PT Short Term Goal 5 (Week 1): Patient will transfer bed <> wheelchair with close supervision.  Skilled Therapeutic Interventions/Progress Updates:    Pt received seated in recliner, agreeable to participate in therapy. PT performed squat pivot transfer recliner>w/c after directing therapist to set up chairs w/ min cues for proper setup and close S for transfer. Pt performed w/c propulsion w/ close S, noted pt attended to RUE w/ no cueing and improved coordination in R hand since last time this therapist treated pt on Sunday. Therapist instructed pt in setting up w/c for transfer to Nustep machine, pt set up wheelchair in unsafe position. After therapist setup chair for transfer, pt transferred w/ close S. Pt completed 5' on Nustep L6 LE only, then 5' w/ RUE/BLE L8 for endurance, reciprocal coordination. Pt ambulated 5' w/ weighted RW to mat table. Blocked practice for sit<>stand training, x10 w/ RW, then x10 half-sit<>stands w/ RW, then x10 w/ block under LLE to increase WBing through RLE w/ RW, then x10 w/ block under LLE and using therapist's forearms for assistive devices. For high level gait pt performed side stepping at edge of mat table w/ hands on therapist forearms, noted significant WBing through BUE and significant use of BUE to maintain  balance. Blocked practice for scoot transfer mat<>w/c x5 w/ min cues for hand placement and sequencing. Pt propelled w/c back to room w/ S, left seated in w/c w/ all needs within reach.  Therapy Documentation Precautions:  Precautions Precautions: Fall Precaution Comments: Loop recorder Restrictions Weight Bearing Restrictions: No General:   Vital Signs: Therapy Vitals Temp: 97.4 F (36.3 C) Temp src: Oral Pulse Rate: 60 Resp: 18 BP: 144/62 mmHg Patient Position (if appropriate): Sitting Oxygen Therapy SpO2: 95 % O2 Device: None (Room air) Pain: Pain Assessment Pain Assessment: No/denies pain Pain Score: 0-No pain Mobility:   Locomotion : Wheelchair Mobility Distance: 250  Trunk/Postural Assessment :    Balance:   Exercises:   Other Treatments:    See FIM for current functional status  Therapy/Group: Individual Therapy  Hosie Spangle Hosie Spangle, PT, DPT 01/12/2014, 5:04 PM

## 2014-01-12 NOTE — Progress Notes (Addendum)
Subjective/Complaints: 73 y.o. male with history of Hep C, familial spastic paraparesis with gait disorder, HTN, who was admitted on 12/31/13 with right sided weakness and slurred speech. Patient reported that he had difficulty turning the key or gripping the wheel in his truck for about 30 minutes then fell out of his truck while trying to get out and struck his head. EMS activated and CT head in ED negative for hemorrhage. MRI brain with patchy areas of acute infarction in the left MCA territory, predominantly involving the parietal lobe and small acute right parietal infarct. MRA brain with 4 mm abnormality along the posterior aspect of the proximal right PCA described on CTA remains indeterminate with question of partially thrombosed aneurysm. 2D echo with EF 55-60%, moderate AVS,with grade 2 diastolic dysfunction. Carotid dopplers without significant ICA stenosis.      Review of Systems - Negative except both legs weak, constipation   Objective: Vital Signs: Blood pressure 121/52, pulse 55, temperature 97.8 F (36.6 C), temperature source Oral, resp. rate 18, weight 84.6 kg (186 lb 8.2 oz), SpO2 95.00%. No results found. Results for orders placed during the hospital encounter of 01/05/14 (from the past 72 hour(s))  BASIC METABOLIC PANEL     Status: Abnormal   Collection Time    01/10/14  7:06 AM      Result Value Ref Range   Sodium 143  137 - 147 mEq/L   Potassium 4.8  3.7 - 5.3 mEq/L   Chloride 107  96 - 112 mEq/L   CO2 25  19 - 32 mEq/L   Glucose, Bld 98  70 - 99 mg/dL   BUN 25 (*) 6 - 23 mg/dL   Creatinine, Ser 1.40 (*) 0.50 - 1.35 mg/dL   Calcium 9.7  8.4 - 10.5 mg/dL   GFR calc non Af Amer 48 (*) >90 mL/min   GFR calc Af Amer 56 (*) >90 mL/min   Comment: (NOTE)     The eGFR has been calculated using the CKD EPI equation.     This calculation has not been validated in all clinical situations.     eGFR's persistently <90 mL/min signify possible Chronic Kidney   Disease.   Anion gap 11  5 - 15     HEENT: normal Cardio: RRR and Gr 3/6 SEM Resp: CTA B/L and unlabored GI: BS positive and NT, ND Extremity:  Pulses positive and No Edema Skin:   Intact Neuro: Alert/Oriented, Cranial Nerve II-XII normal, Abnormal Sensory reduced sensation in RUE and RLE to LT, Abnormal Motor 5/5 LUE, 4/5 RUE, 3/5 BLE and Tone:  increased bilateral LE extensor tone, MAS 2-3 Musc/Skel:  Other decreased ROM bilateral Hips, Knees, ankles Gen NAD   Assessment/Plan: 1. Functional deficits secondary to Right MCA infarct which require 3+ hours per day of interdisciplinary therapy in a comprehensive inpatient rehab setting. Physiatrist is providing close team supervision and 24 hour management of active medical problems listed below. Physiatrist and rehab team continue to assess barriers to discharge/monitor patient progress toward functional and medical goals. Team conference today please see physician documentation under team conference tab, met with team face-to-face to discuss problems,progress, and goals. Formulized individual treatment plan based on medical history, underlying problem and comorbidities. FIM: FIM - Bathing Bathing Steps Patient Completed: Chest;Right Arm;Left Arm;Abdomen;Right upper leg;Left upper leg;Right lower leg (including foot);Left lower leg (including foot) Bathing: 3: Mod-Patient completes 5-7 63f 10 parts or 50-74%  FIM - Upper Body Dressing/Undressing Upper body dressing/undressing steps patient  completed: Thread/unthread right sleeve of pullover shirt/dresss;Thread/unthread left sleeve of pullover shirt/dress;Put head through opening of pull over shirt/dress;Pull shirt over trunk Upper body dressing/undressing: 5: Set-up assist to: Obtain clothing/put away FIM - Lower Body Dressing/Undressing Lower body dressing/undressing steps patient completed: Thread/unthread right underwear leg;Thread/unthread left underwear leg;Thread/unthread left pants  leg;Pull pants up/down;Don/Doff right sock;Don/Doff left sock;Don/Doff right shoe;Don/Doff left shoe;Pull underwear up/down;Thread/unthread right pants leg;Fasten/unfasten right shoe;Fasten/unfasten left shoe Lower body dressing/undressing: 4: Steadying Assist  FIM - Toileting Toileting steps completed by patient: Adjust clothing prior to toileting;Performs perineal hygiene Toileting Assistive Devices: Grab bar or rail for support Toileting: 3: Mod-Patient completed 2 of 3 steps  FIM - Radio producer Devices: Grab bars Toilet Transfers: 3-To toilet/BSC: Mod A (lift or lower assist);4-From toilet/BSC: Min A (steadying Pt. > 75%)  FIM - Bed/Chair Transfer Bed/Chair Transfer Assistive Devices: Arm rests Bed/Chair Transfer: 4: Bed > Chair or W/C: Min A (steadying Pt. > 75%);4: Chair or W/C > Bed: Min A (steadying Pt. > 75%)  FIM - Locomotion: Wheelchair Distance: 180 Locomotion: Wheelchair: 5: Travels 150 ft or more: maneuvers on rugs and over door sills with supervision, cueing or coaxing FIM - Locomotion: Ambulation Locomotion: Ambulation Assistive Devices: Administrator Ambulation/Gait Assistance: 1: +2 Total assist;3: Mod assist (+2A for w/c follow) Locomotion: Ambulation: 1: Two helpers  Comprehension Comprehension Mode: Auditory Comprehension: 5-Follows basic conversation/direction: With extra time/assistive device  Expression Expression Mode: Verbal Expression: 5-Expresses complex 90% of the time/cues < 10% of the time  Social Interaction Social Interaction: 4-Interacts appropriately 75 - 89% of the time - Needs redirection for appropriate language or to initiate interaction.  Problem Solving Problem Solving: 4-Solves basic 75 - 89% of the time/requires cueing 10 - 24% of the time  Memory Memory: 4-Recognizes or recalls 75 - 89% of the time/requires cueing 10 - 24% of the time  Medical Problem List and Plan: 1. Functional deficits secondary  to bilateral embolic infarcts, right MCA > left parietal distribution also with hereditary spastic paraplegia 2.  DVT Prophylaxis/Anticoagulation: Pharmaceutical: Lovenox 3. Pain Management: Tylenol prn.   4. Depression/Mood: Willing to start antidepressant. Willing to talk to psychologist about current impairments  as well as issues regarding ex-wife.   5. Neuropsych: This patient is capable of making decisions on his own behalf. 6. Skin/Wound Care: Routine pressure relief measures. Maintain adequate hydration and nutritional status.   7. Dyslipidemia: On lipitor 8.  Bowel incont secondary to laxatives, will change senna to colace 9.  HTN- was on lotensin, furosemide and amlodipine at home, meds held in acute care, BPs fluctuating but up to 180/90 yesterday, restarted amlodipine at a lower dose, hold off on lotensin and furosemide (inc Creat),now normotensive LOS (Days) 7 A FACE TO FACE EVALUATION WAS PERFORMED  KIRSTEINS,ANDREW E 01/12/2014, 8:32 AM

## 2014-01-12 NOTE — Progress Notes (Signed)
Occupational Therapy Session Note  Patient Details  Name: Calvin Merritt MRN: [id number] Date of Birth: [birth year]  Today's Date: 01/12/2014 OT Individual Time: 7829-5621 OT Individual Time Calculation (min): 45 min   Short Term Goals: Week 1:  OT Short Term Goal 1 (Week 1): Pt will self feed with right hand with build up utensil with setup with extra time for 50% of meal OT Short Term Goal 2 (Week 1): Pt will transfer stand pivot to toilet with min A  OT Short Term Goal 3 (Week 1): Pt will perform clothing management with bilateral UEs with steady A in standing  OT Short Term Goal 4 (Week 1): Pt will demonstrate bed mobility in prep for ADL without UE support with min A with extra time  Skilled Therapeutic Interventions/Progress Updates:    Engaged in ADL retraining and therapeutic activity with focus on increased safety with mobility and self-care tasks, sit > stand, standing tolerance, and functional use of RUE.  Pt seated in w/c upon arrival reports dressing himself at bed level, however pt reports threading BLE through same leg hole of shorts.  Increased safety awareness with waiting for therapist prior to standing to fix shorts.  Pt stood with mod assist without UE support to doff shorts to correct them, required 2 additional attempts before successfully threading both legs through separate holes.  Stood at sink with min/steady assist to pull shorts over hips while steadying self on sink.  Pt propelled w/c to therapy gym with increased RUE coordination and motor control.  Discussed purposeful movements and visual attention to RUE to increase control.  Engaged in gross motor and [facility] task in standing at table with focus on standing balance, standing tolerance, and functional use of RUE while completing 3D pipe tree activity.  Encouraged pt to utilize RUE to grasp pieces placed on Lt side of body to promote trunk rotation and further challenge standing balance and tolerance.  Educated on use of  vision to compensate for decreased sensation when picking up pieces. Mod verbal cues for standing balance as knees tend to buckle as pt fatigue increased.    Therapy Documentation Precautions:  Precautions Precautions: Fall Precaution Comments: Loop recorder Restrictions Weight Bearing Restrictions: No Pain: Pain Assessment Pain Assessment: No/denies pain ADL: ADL ADL Comments: see FIM  See FIM for current functional status  Therapy/Group: Individual Therapy  Rosalio Loud 01/12/2014, 10:17 AM

## 2014-01-12 NOTE — Progress Notes (Signed)
Social Work Patient ID: Calvin Merritt, male   DOB: 1940/12/20, 73 y.o.   MRN: [id number] Met with pt to discuss team conference goals-supervision level and discharge 9/5.  He does not want to go to a NH and will plan to go to his home. He will have his son do what he can for him, but there will be time she is there alone.  He is aware of the team's concerns and appreciates them worrying About his safety.  He had a gait disorder prior to admission and will manage.  Will work toward discharge and try to get son in for education prior to discharge.

## 2014-01-12 NOTE — Progress Notes (Signed)
Physical Therapy Session Note  Patient Details  Name: Calvin Merritt MRN: [id number] Date of Birth: 16-Nov-1940  Today's Date: 01/12/2014 PT Individual Time: 1123-1215 and 1351-1432 PT Individual Time Calculation (min): 52 min and 41 min  Short Term Goals: Week 1:  PT Short Term Goal 1 (Week 1): Patient will be independent with bed mobility of regular double bed to simulate home environment. PT Short Term Goal 2 (Week 1): Patient will perform sit to stand to rollator with close supervision PT Short Term Goal 3 (Week 1): Patient will ambulate with rollator 150 feet with min assist PT Short Term Goal 4 (Week 1): Patient will ambulate up/down ramp with min assist using rollator PT Short Term Goal 5 (Week 1): Patient will transfer bed <> wheelchair with close supervision.  Skilled Therapeutic Interventions/Progress Updates:    Treatment Session 1: Pt received seated EOB; agreeable to therapy. Session focused on increasing pt independence with functional transfers and addressing perceptual impairments. Initiated pt education of D/C recommendations from team conference. Pt with tearful episode with explanation of recommendation for 24/7 supervision after D/C. Educated pt on rationale for D/C recommendations. Notified CSW Kriste Basque of conversation. Pt performed multiple squat pivot transfers from w/c<>mat table<>couch with min guard, mod cueing for transfer setup, w/c parts management, and technique with inconsistent within-session carryover. Will continue to reinforce. Performed w/c mobility x225' total in controlled and home environment with min A, cueing for attention to RUE on hand rim, and verbal/demonstration cueing for w/c propulsion technique for negotiating sharp turns with effective within-session carryover. See below for detailed description of NMR interventions. Departed pt room with pt seated in w/c with all needs within reach.  Treatment Session 2: Pt received seated in w/c; agreeable to therapy.  Session focused on community w/c mobility and functional transfers. Pt performed w/c mobility x250' in controlled and community environment with bilat UE's and supervision. Community w/c mobility included safe negotiation of standard ramp x2 trials without assist. Pt continues to require supervision, cueing for technique for RUE attention and for technique with sharp turns, obstacle negotiation in room. In ortho gym, performed blocked practice of squat pivot transfers; pt with effective between-session carryover of ~50% of transfer setup, technique, and w/c parts management. Pt left seated in recliner with bilat LE's elevated and all needs within reach.  Long term goals downgraded due to decreased safety, gait instability.  Therapy Documentation Precautions:  Precautions Precautions: Fall Precaution Comments: Loop recorder Restrictions Weight Bearing Restrictions: No Pain: Pain Assessment Pain Assessment: No/denies pain Locomotion : Wheelchair Mobility Distance: 225  NMR:  Neuromuscular Facilitation: Right;Upper Extremity;Lower Extremity;Left;Forced use;Activity to increase coordination;Activity to increase anterior-posterior weight shifting;Activity to increase grading;Activity to increase lateral weight shifting;Activity to increase motor control;Activity to increase sustained activation Weight Bearing Technique Weight Bearing Technique: Yes RUE Weight Bearing Technique: High kneeling In treatment gym, educated pt on fall recovery, situations in which to activate EMS after fall. Explained, demonstrated floor transfer (seated on mat table<>tall kneeling<>quadruped) for fall recovery and quadruped position (to elicit RUE/RLE weightbearing, activation, proprioception, functional use of RUE). Pt then performed transfer with Max A overall and 75% multimodal cueing for safe technique. While in tall kneeling (on mat on floor with bilat UE support at mat table), pt performed anterolateral reaching  with RUE then LUE with tactile/verbal cueing for erect spine.  See FIM for current functional status  Therapy/Group: Individual Therapy  Koleman Marling, Lorenda Ishihara 01/12/2014, 12:39 PM

## 2014-01-13 ENCOUNTER — Inpatient Hospital Stay (HOSPITAL_COMMUNITY): Payer: Medicare Other | Admitting: Physical Therapy

## 2014-01-13 ENCOUNTER — Encounter (HOSPITAL_COMMUNITY): Payer: Medicare Other | Admitting: Occupational Therapy

## 2014-01-13 ENCOUNTER — Inpatient Hospital Stay (HOSPITAL_COMMUNITY): Payer: Medicare Other | Admitting: Occupational Therapy

## 2014-01-13 DIAGNOSIS — F329 Major depressive disorder, single episode, unspecified: Secondary | ICD-10-CM

## 2014-01-13 DIAGNOSIS — Z5189 Encounter for other specified aftercare: Secondary | ICD-10-CM

## 2014-01-13 DIAGNOSIS — I6340 Cerebral infarction due to embolism of unspecified cerebral artery: Secondary | ICD-10-CM

## 2014-01-13 DIAGNOSIS — F3289 Other specified depressive episodes: Secondary | ICD-10-CM

## 2014-01-13 NOTE — Progress Notes (Addendum)
Physical Therapy Weekly Progress Note  Patient Details  Name: Calvin Merritt MRN: [id number] Date of Birth: May 05, 1941  Beginning of progress report period: January 06, 2014 End of progress report period: January 13, 2014  Today's Date: 01/13/2014 PT Individual Time: 8242-3536 PT Individual Time Calculation (min): 60 min   Patient has met 1 of 5 short term goals.  Pt demonstrates increased safety/independence with squat pivot transfers; however, pt continues to require min-mod A with sit<>stand transfers secondary to avoidance of anterior weight shift, fear of falling. Pt making slow progress with ambulation; however, premorbid bilat LE spasticity continues to limit gait stability. Discontinued STG for ambulation up/down ramp to enter home, as LTG modified to negotiation of standard ramp using w/c.  Patient continues to demonstrate the following deficits: impaired timing and sequencing, abnormal tone, unbalanced muscle activation and decreased coordination, decreased attention to right, decreased attention and decreased safety awareness and decreased sitting balance, decreased standing balance, decreased postural control, hemiplegia and decreased balance strategies and therefore will continue to benefit from skilled PT intervention to enhance overall performance with balance, postural control, ability to compensate for deficits, functional use of  right upper extremity and right lower extremity, awareness and coordination.  Patient progressing toward long term goals..  Continue plan of care.  PT Short Term Goals Week 1:  PT Short Term Goal 1 (Week 1): Patient will be independent with bed mobility of regular double bed to simulate home environment. PT Short Term Goal 1 - Progress (Week 1): Progressing toward goal PT Short Term Goal 2 (Week 1): Patient will perform sit to stand to rollator with close supervision PT Short Term Goal 2 - Progress (Week 1): Progressing toward goal PT Short Term Goal 3  (Week 1): Patient will ambulate with rollator 150 feet with min assist PT Short Term Goal 3 - Progress (Week 1): Progressing toward goal PT Short Term Goal 4 (Week 1): Patient will ambulate up/down ramp with min assist using rollator PT Short Term Goal 4 - Progress (Week 1): Discontinued (comment) (LTG modified to negotiation of standard ramp in w/c) PT Short Term Goal 5 (Week 1): Patient will transfer bed <> wheelchair with close supervision. PT Short Term Goal 5 - Progress (Week 1): Met Week 2:  PT Short Term Goal 1 (Week 2): Pt will consistently perform supine>sit with supervision with HOB flat, no rails. PT Short Term Goal 2 (Week 2): Pt will consistently perform sit>stand from w/c with supervision. PT Short Term Goal 3 (Week 2): Pt will perform gait x50' in controlled environment with min A using LRAD. PT Short Term Goal 4 (Week 2): Pt will manage all w/c parts without assistance/cueing.  Skilled Therapeutic Interventions/Progress Updates:    Pt received semi reclined in bed; agreeable to therapy. Session focused on increasing pt safety, independence with bed mobility and functional transfers. Performed blocked practice of supine<>sit with HOB flat, no rails with verbal/tactile cueing to move LUE across midline, min cueing for bilat LE management. Multiple squat pivot transfers from bed>w/c<>mat table with close supervision to min guard, 25% cueing for setup and technique. Pt managed w/c parts with mod cueing for R leg rest and bilat arm rests. Pt spent 7 minutes attempting to manage w/c leg rests without assist/cueing secondary to pt adamantly insisting to "do it myself first," per pt. When pt finally agreeable to instruction/cueing, pt able to don R leg rest with min cueing. NMR interventions (performed in treatment gym), focused on blocked practice of sit<>stand with tactile  cueing for anterior weight shift, subtle-min cueing for setup. Transitioned to seated anterior reaching for functional use  of RUE, full anterior weight shift to increase independence with sit>stand. Pt required no cueing for visual attention to RUE during w/c mobility during this session. Session ended in pt room, where pt was left seated in w/c with all needs within reach.  Therapy Documentation Precautions:  Precautions Precautions: Fall Precaution Comments: Loop recorder Restrictions Weight Bearing Restrictions: No Pain:  Pt reports no pain during PT session. Locomotion : Wheelchair Mobility Distance: 170   See FIM for current functional status  Therapy/Group: Individual Therapy  Sharlet Notaro, Malva Cogan 01/13/2014, 12:35 PM

## 2014-01-13 NOTE — Progress Notes (Signed)
Subjective/Complaints: 73 y.o. male with history of Hep C, familial spastic paraparesis with gait disorder, HTN, who was admitted on 12/31/13 with right sided weakness and slurred speech. Patient reported that he had difficulty turning the key or gripping the wheel in his truck for about 30 minutes then fell out of his truck while trying to get out and struck his head. EMS activated and CT head in ED negative for hemorrhage. MRI brain with patchy areas of acute infarction in the left MCA territory, predominantly involving the parietal lobe and small acute right parietal infarct. MRA brain with 4 mm abnormality along the posterior aspect of the proximal right PCA described on CTA remains indeterminate with question of partially thrombosed aneurysm. 2D echo with EF 55-60%, moderate AVS,with grade 2 diastolic dysfunction. Carotid dopplers without significant ICA stenosis.    Pt without new issues overnite  Review of Systems - Negative except both legs weak, constipation   Objective: Vital Signs: Blood pressure 150/67, pulse 49, temperature 98.2 F (36.8 C), temperature source Oral, resp. rate 19, weight 81.8 kg (180 lb 5.4 oz), SpO2 96.00%. No results found. Results for orders placed during the hospital encounter of 01/05/14 (from the past 72 hour(s))  CREATININE, SERUM     Status: Abnormal   Collection Time    01/12/14  7:52 AM      Result Value Ref Range   Creatinine, Ser 1.37 (*) 0.50 - 1.35 mg/dL   GFR calc non Af Amer 50 (*) >90 mL/min   GFR calc Af Amer 57 (*) >90 mL/min   Comment: (NOTE)     The eGFR has been calculated using the CKD EPI equation.     This calculation has not been validated in all clinical situations.     eGFR's persistently <90 mL/min signify possible Chronic Kidney     Disease.     HEENT: normal Cardio: RRR and Gr 3/6 SEM Resp: CTA B/L and unlabored GI: BS positive and NT, ND Extremity:  Pulses positive and No Edema Skin:   Intact Neuro: Alert/Oriented,  Cranial Nerve II-XII normal, Abnormal Sensory reduced sensation in RUE and RLE to LT, Abnormal Motor 5/5 LUE, 4/5 RUE, 3/5 BLE and Tone:  increased bilateral LE extensor tone, MAS 2-3 Musc/Skel:  Other decreased ROM bilateral Hips, Knees, ankles Gen NAD   Assessment/Plan: 1. Functional deficits secondary to Right MCA infarct which require 3+ hours per day of interdisciplinary therapy in a comprehensive inpatient rehab setting. Physiatrist is providing close team supervision and 24 hour management of active medical problems listed below. Physiatrist and rehab team continue to assess barriers to discharge/monitor patient progress toward functional and medical goals.  FIM: FIM - Bathing Bathing Steps Patient Completed: Chest;Right Arm;Left Arm;Abdomen;Right upper leg;Left upper leg;Right lower leg (including foot);Left lower leg (including foot) Bathing: 3: Mod-Patient completes 5-7 23f 10 parts or 50-74%  FIM - Upper Body Dressing/Undressing Upper body dressing/undressing steps patient completed: Thread/unthread right sleeve of pullover shirt/dresss;Thread/unthread left sleeve of pullover shirt/dress;Put head through opening of pull over shirt/dress;Pull shirt over trunk Upper body dressing/undressing: 5: Set-up assist to: Obtain clothing/put away FIM - Lower Body Dressing/Undressing Lower body dressing/undressing steps patient completed: Thread/unthread right pants leg;Thread/unthread left pants leg;Pull pants up/down;Don/Doff right sock;Don/Doff left sock;Don/Doff right shoe;Don/Doff left shoe;Fasten/unfasten right shoe;Fasten/unfasten left shoe Lower body dressing/undressing: 4: Steadying Assist  FIM - Toileting Toileting steps completed by patient: Adjust clothing prior to toileting;Performs perineal hygiene Toileting Assistive Devices: Grab bar or rail for support Toileting: 3: Mod-Patient  completed 2 of 3 steps  FIM - Radio producer Devices: Grab  bars Toilet Transfers: 3-To toilet/BSC: Mod A (lift or lower assist);4-From toilet/BSC: Min A (steadying Pt. > 75%)  FIM - Bed/Chair Transfer Bed/Chair Transfer Assistive Devices: Arm rests Bed/Chair Transfer: 4: Bed > Chair or W/C: Min A (steadying Pt. > 75%);4: Chair or W/C > Bed: Min A (steadying Pt. > 75%)  FIM - Locomotion: Wheelchair Distance: 250 Locomotion: Wheelchair: 5: Travels 150 ft or more: maneuvers on rugs and over door sills with supervision, cueing or coaxing FIM - Locomotion: Ambulation Locomotion: Ambulation Assistive Devices: Administrator Ambulation/Gait Assistance: 1: +2 Total assist;3: Mod assist (+2A for w/c follow) Locomotion: Ambulation: 0: Activity did not occur  Comprehension Comprehension Mode: Auditory Comprehension: 5-Follows basic conversation/direction: With no assist  Expression Expression Mode: Verbal Expression: 5-Expresses basic 90% of the time/requires cueing < 10% of the time.  Social Interaction Social Interaction: 4-Interacts appropriately 75 - 89% of the time - Needs redirection for appropriate language or to initiate interaction.  Problem Solving Problem Solving: 4-Solves basic 75 - 89% of the time/requires cueing 10 - 24% of the time  Memory Memory: 5-Recognizes or recalls 90% of the time/requires cueing < 10% of the time  Medical Problem List and Plan: 1. 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  DVT Prophylaxis/Anticoagulation: Pharmaceutical: Lovenox 3. Pain Management: Tylenol prn.   4. Depression/Mood: Willing to start antidepressant. Willing to talk to psychologist about current impairments  as well as issues regarding ex-wife.   5. Neuropsych: This patient is capable of making decisions on his own behalf. 6. Skin/Wound Care: Routine pressure relief measures. Maintain adequate hydration and nutritional status.   7.  Dyslipidemia: On lipitor 8.  Bowel incont secondary to laxatives, will change senna to colace 9.  HTN- was on lotensin, furosemide and amlodipine at home, meds held in acute care, BPs fluctuating  restarted amlodipine at a lower dose, hold off on lotensin and furosemide (inc Creat),now normotensive 10.  Aortic stenosis, f/u cardiology as outpt LOS (Days) 8 A FACE TO FACE EVALUATION WAS PERFORMED  Kylani Wires E 01/13/2014, 8:16 AM

## 2014-01-13 NOTE — Progress Notes (Addendum)
Occupational Therapy Session Note  Patient Details  Name: Calvin Merritt MRN: [id number] Date of Birth: 1941/01/26  Today's Date: 01/13/2014 OT Individual Time: 1101-1201 and 1305-1407   Time Calculation: 60 min and 62 min   Short Term Goals: Week 1:  OT Short Term Goal 1 (Week 1): Pt will self feed with right hand with build up utensil with setup with extra time for 50% of meal OT Short Term Goal 2 (Week 1): Pt will transfer stand pivot to toilet with min A  OT Short Term Goal 3 (Week 1): Pt will perform clothing management with bilateral UEs with steady A in standing  OT Short Term Goal 4 (Week 1): Pt will demonstrate bed mobility in prep for ADL without UE support with min A with extra time  Skilled Therapeutic Interventions/Progress Updates:  1)  Upon arrival Pt was groomed and dressed, seated in w/c, prior to treatment session.  Participated in tabletop therapeutic activity in therapy gym seated, focusing on fine motor skills, retrieving and placing small pegs with coban wrapping using right hand and fingers.  Verbal cues provided to visually attend to right hand when grasping and releasing objects due to decreased sensation in fingers.  Simulated sorting weekly pills (various sizes and colors of beads) into pill box while increasing fine motor skills, standing tolerance and balance from sit>stand at Min A, as well as providing weightbearing input throughout BLE.  Educated Pt on safely dispersing pills while using solid colored towel on tabletop surface to decrease chances of pills rolling and/or getting lost on the floor, Pt was not concerned with safety issue.  2)  Engaged in therapeutic activity to focus on dynamic sitting balance and gross motor skills while seated at edge of mat table in therapy gym while participating in ring toss activity using right hand.  Provided verbal cues for safety when leaning forward during task.  Transitioned from sit>stand with RW to engage in therapeutic  activity using weighted ball to focus on standing balance and endurance, as well as encouraging crossing midline and right shoulder flexion at Min A as needed for self care tasks.  Reminded Pt to focus on foot positioning during transfers and when stepping out with RW to increase balance and safety with mobility.  Educated and encouraged Pt to problem solve regarding safe strategies for retrieving objects located at floor level during simulated task.                Therapy Documentation Precautions:  Precautions Precautions: Fall Precaution Comments: Loop recorder Restrictions Weight Bearing Restrictions: No   Pain:  Patient with no complaints of pain.   ADL: ADL ADL Comments: see FIM  See FIM for current functional status  Therapy/Group: Individual Therapy  Lamm,Jillana 01/13/2014, 12:16 PM

## 2014-01-13 NOTE — Consult Note (Signed)
NEUROCOGNITIVE STATUS EXAMINATION - [facility]   Mr. Calvin Merritt is a 73 year old man, who was seen for a brief neurocognitive status examination to evaluate his emotional state and mental status in the setting of stroke.  According to his medical record, he was admitted on 12/31/13 with right sided weakness and slurred speech.  He purportedly fell out of his truck while experiencing symptoms and struck his head.  CT was negative for hemorrhage.  MRI of the brain revealed patchy areas of acute infarction in the left MCA territory, predominantly involving the parietal lobe and small acute right parietal infarct.    Emotional Functioning:  During the clinical interview, Mr. Calvin Merritt initially stated that he was "all right" and he denied depressive symptoms, including suicidal/homicidal ideation.  He stated that he was optimistic about his physical recovery.  As the session progressed, he began to more openly discuss other stressors, however, including reactions to divorce from his wife, which was finalized 4 months ago.  We processed his emotions surrounding the separation from his wife and explored how his reactions to that situation could be impacting his current recovery.  Notably, Mr. Calvin Merritt stated that he felt isolated from social support, except from his eldest son.  Despite saying that it was helpful to process his divorce and stroke with the neuropsychologist today, he said that he was not interested in pursuing individual psychotherapy post-discharge.  Mr. Calvin Merritt responses to self-report measures of mood symptoms were not suggestive of the presence of clinically significant depression or anxiety at this time.    Mental Status:  Mr. Calvin Merritt total score on an overall measure of mental status was not suggestive of cognitive impairment at the level of dementia (MMSE-2 brief = 13/16).  He lost points for misstating the season of the year and for inability to freely recall  2 of 3 previously studied words after a brief delay.  Subjectively, he denied noticing cognitive changes.    Impressions and Recommendations:  Mr. Calvin Merritt total score on a brief measure of mental status was not suggestive of the presence of cognitive impairment at the level of dementia.  However, he lost a couple of points, which could indicate that he is having mild cognitive disruption secondary to stroke.  Cognitive recovery expectations in cases of stroke were discussed and Mr. Calvin Merritt is encouraged to seek a comprehensive neuropsychological evaluation as an outpatient, should his cognitive symptoms disrupt his daily life or should they persist beyond one year.  From an emotional standpoint, Mr. Calvin Merritt seems to be holding in a significant amount of anger surrounding his divorce, though there was no evidence to suggest the presence of clinically significant mood disruption.  He was encouraged to let out his anger, rather than keeping it inside; either through discussing it with someone or through journaling and he was agreeable to this.  In addition, he would likely benefit from participation in individual psychotherapy and although he was expressing disinterest in participating in psychotherapy at this time, information on local providers should be given to him with his discharge paperwork in case he opts to pursue psychotherapy in the future.    DIAGNOSIS: CVA  Leavy Cella, Psy.D.  Clinical Neuropsychologist

## 2014-01-14 ENCOUNTER — Encounter (HOSPITAL_COMMUNITY): Payer: Medicare Other | Admitting: Occupational Therapy

## 2014-01-14 ENCOUNTER — Inpatient Hospital Stay (HOSPITAL_COMMUNITY): Payer: Medicare Other | Admitting: Occupational Therapy

## 2014-01-14 ENCOUNTER — Inpatient Hospital Stay (HOSPITAL_COMMUNITY): Payer: Medicare Other | Admitting: Physical Therapy

## 2014-01-14 MED ORDER — DOCUSATE SODIUM 100 MG PO CAPS
100.0000 mg | ORAL_CAPSULE | Freq: Every day | ORAL | Status: DC | PRN
  Filled 2014-01-14: qty 1

## 2014-01-14 NOTE — Progress Notes (Signed)
Physical Therapy Session Note  Patient Details  Name: Calvin Merritt MRN: [id number] Date of Birth: June 07, 1940  Today's Date: 01/14/2014 PT Individual Time:  4098-1191 Time Calculation (min): 58 min   Short Term Goals: Week 2:  PT Short Term Goal 1 (Week 2): Pt will consistently perform supine>sit with supervision with HOB flat, no rails. PT Short Term Goal 2 (Week 2): Pt will consistently perform sit>stand from w/c with supervision. PT Short Term Goal 3 (Week 2): Pt will perform gait x50' in controlled environment with min A using LRAD. PT Short Term Goal 4 (Week 2): Pt will manage all w/c parts without assistance/cueing.  Skilled Therapeutic Interventions/Progress Updates:    Pt received seated in w/c in treatment gym having just completed OT session. Current session focused on increasing gait stability. Performed gait 4x8' with bilat UE support at parallel bars. With standing/gait, noted significant improvement in hip/knee extension secondary to decreased flexor tone in bilat LE's. During gait trial, did note  Ongoing R hip adduction/internal rotation and excessive R ankle inversion during RLE stance. Attempted to don R Toe Off AFO; however, pt with increased pain on R great toe secondary to great toe deformity causing increased friction on superior aspect of great toe. Therefore, donned ACE bandage at R ankle to promote R ankle dorsiflexion/eversion to increase RLE clearance, decrease risk of injury. Also donned 1 lb weight at R ankle to increase proprioceptive input at RLE. Pt performed gait x160' in controlled environment with rolling walker (weighted with 6 lbs to increase pt control of walker advancement) requiring min guard-min A for majority of trial; mod A to recover from single LOB due to loss of control of walker. Session ended in pt room, where pt was left seated in w/c with all needs within reach.  Therapy Documentation Precautions:  Precautions Precautions: Fall Precaution  Comments: Loop recorder Restrictions Weight Bearing Restrictions: No Vital Signs: Therapy Vitals Temp: 97.9 F (36.6 C) Temp src: Oral Pulse Rate: 51 Resp: 18 BP: 159/62 mmHg Patient Position (if appropriate): Sitting Oxygen Therapy SpO2: 95 % O2 Device: None (Room air) Pain:  Pt reports no pain during this PT session.  See FIM for current functional status  Therapy/Group: Individual Therapy  Genoa Freyre, Lorenda Ishihara 01/14/2014, 5:46 PM

## 2014-01-14 NOTE — Progress Notes (Signed)
Subjective/Complaints: 73 y.o. male with history of Hep C, familial spastic paraparesis with gait disorder, HTN, who was admitted on 12/31/13 with right sided weakness and slurred speech. Patient reported that he had difficulty turning the key or gripping the wheel in his truck for about 30 minutes then fell out of his truck while trying to get out and struck his head. EMS activated and CT head in ED negative for hemorrhage. MRI brain with patchy areas of acute infarction in the left MCA territory, predominantly involving the parietal lobe and small acute right parietal infarct. MRA brain with 4 mm abnormality along the posterior aspect of the proximal right PCA described on CTA remains indeterminate with question of partially thrombosed aneurysm. 2D echo with EF 55-60%, moderate AVS,with grade 2 diastolic dysfunction. Carotid dopplers without significant ICA stenosis.    Pt without new issues Bowels moving twice a day, pt refusing stool softener  Review of Systems - Negative except both legs weak, constipation   Objective: Vital Signs: Blood pressure 143/60, pulse 47, temperature 98.3 F (36.8 C), temperature source Oral, resp. rate 18, weight 81.8 kg (180 lb 5.4 oz), SpO2 96.00%. No results found. Results for orders placed during the hospital encounter of 01/05/14 (from the past 72 hour(s))  CREATININE, SERUM     Status: Abnormal   Collection Time    01/12/14  7:52 AM      Result Value Ref Range   Creatinine, Ser 1.37 (*) 0.50 - 1.35 mg/dL   GFR calc non Af Amer 50 (*) >90 mL/min   GFR calc Af Amer 57 (*) >90 mL/min   Comment: (NOTE)     The eGFR has been calculated using the CKD EPI equation.     This calculation has not been validated in all clinical situations.     eGFR's persistently <90 mL/min signify possible Chronic Kidney     Disease.     HEENT: normal Cardio: RRR and Gr 3/6 SEM Resp: CTA B/L and unlabored GI: BS positive and NT, ND Extremity:  Pulses positive and No  Edema Skin:   Intact Neuro: Alert/Oriented, Cranial Nerve II-XII normal, Abnormal Sensory reduced sensation in RUE and RLE to LT, Abnormal Motor 5/5 LUE, 4/5 RUE, 3/5 BLE and Tone:  increased bilateral LE extensor tone, MAS 2-3 Musc/Skel:  Other decreased ROM bilateral Hips, Knees, ankles Gen NAD   Assessment/Plan: 1. Functional deficits secondary to Right MCA infarct which require 3+ hours per day of interdisciplinary therapy in a comprehensive inpatient rehab setting. Physiatrist is providing close team supervision and 24 hour management of active medical problems listed below. Physiatrist and rehab team continue to assess barriers to discharge/monitor patient progress toward functional and medical goals.  FIM: FIM - Bathing Bathing Steps Patient Completed: Chest;Right Arm;Left Arm;Abdomen;Right upper leg;Left upper leg;Right lower leg (including foot);Left lower leg (including foot) Bathing: 3: Mod-Patient completes 5-7 [id number] parts or 50-74%  FIM - Upper Body Dressing/Undressing Upper body dressing/undressing steps patient completed: Thread/unthread right sleeve of pullover shirt/dresss;Thread/unthread left sleeve of pullover shirt/dress;Put head through opening of pull over shirt/dress;Pull shirt over trunk Upper body dressing/undressing: 5: Set-up assist to: Obtain clothing/put away FIM - Lower Body Dressing/Undressing Lower body dressing/undressing steps patient completed: Thread/unthread right pants leg;Thread/unthread left pants leg;Pull pants up/down;Don/Doff right sock;Don/Doff left sock;Don/Doff right shoe;Don/Doff left shoe;Fasten/unfasten right shoe;Fasten/unfasten left shoe Lower body dressing/undressing: 4: Steadying Assist  FIM - Toileting Toileting steps completed by patient: Adjust clothing prior to toileting;Performs perineal hygiene Toileting Assistive Devices: Grab  bar or rail for support Toileting: 3: Mod-Patient completed 2 of 3 steps  FIM - Glass blower/designer Devices: Grab bars Toilet Transfers: 3-To toilet/BSC: Mod A (lift or lower assist);4-From toilet/BSC: Min A (steadying Pt. > 75%)  FIM - Bed/Chair Transfer Bed/Chair Transfer Assistive Devices: Arm rests;Bed rails Bed/Chair Transfer: 4: Bed > Chair or W/C: Min A (steadying Pt. > 75%);4: Chair or W/C > Bed: Min A (steadying Pt. > 75%);5: Sit > Supine: Supervision (verbal cues/safety issues);3: Supine > Sit: Mod A (lifting assist/Pt. 50-74%/lift 2 legs  FIM - Locomotion: Wheelchair Distance: 170 Locomotion: Wheelchair: 5: Travels 150 ft or more: maneuvers on rugs and over door sills with supervision, cueing or coaxing FIM - Locomotion: Ambulation Locomotion: Ambulation Assistive Devices: Administrator Ambulation/Gait Assistance: 1: +2 Total assist;3: Mod assist (+2A for w/c follow) Locomotion: Ambulation: 0: Activity did not occur  Comprehension Comprehension Mode: Auditory Comprehension: 5-Follows basic conversation/direction: With no assist  Expression Expression Mode: Verbal Expression: 5-Expresses basic 90% of the time/requires cueing < 10% of the time.  Social Interaction Social Interaction: 4-Interacts appropriately 75 - 89% of the time - Needs redirection for appropriate language or to initiate interaction.  Problem Solving Problem Solving: 4-Solves basic 75 - 89% of the time/requires cueing 10 - 24% of the time  Memory Memory: 5-Recognizes or recalls 90% of the time/requires cueing < 10% of the time  Medical Problem List and Plan: 1. 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  DVT Prophylaxis/Anticoagulation: Pharmaceutical: Lovenox 3. Pain Management: Tylenol prn.   4. Depression/Mood: Willing to start antidepressant. Willing to talk to psychologist about current impairments  as well as issues regarding ex-wife.   5. Neuropsych: This  patient is capable of making decisions on his own behalf. 6. Skin/Wound Care: Routine pressure relief measures. Maintain adequate hydration and nutritional status.   7. Dyslipidemia: On lipitor 8.  Bowel doing better make coace prn 9.  HTN- was on lotensin, furosemide and amlodipine at home, meds held in acute care, BPs fluctuating  restarted amlodipine at a lower dose, hold off on lotensin and furosemide (inc Creat),now normotensive 10.  Aortic stenosis, f/u cardiology as outpt LOS (Days) 9 A FACE TO FACE EVALUATION WAS PERFORMED  Calvin Merritt E 01/14/2014, 7:55 AM

## 2014-01-14 NOTE — Progress Notes (Signed)
Occupational Therapy Weekly Progress Note  Patient Details  Name: Calvin Merritt MRN: [id number] Date of Birth: 1941/03/16  Beginning of progress report period: January 06, 2014 End of progress report period: January 14, 2014  Today's Date: 01/14/2014 OT Individual [phone] and [phone] OT Individual Time Calculation (min): 60 min and 63 min    Patient has met 3 of 4 short term goals.  Pt is making steady progress towards goals.  Pt performs squat pivot transfers at a min - supervision level, however continues to require mod verbal cues and setup for increased safety with transfers due to impulsivity and decreased safety awareness.  Pt with decreased postural control impacting ability to complete clothing management at sit > stand level due to premorbid BLE spasticity.  Pt requires UE support to maintain standing balance.  Pt is making progress with RUE [facility] with self-feeding with use of built up handle.  Patient continues to demonstrate the following deficits: decreased sitting and standing balance, decreased postural control, decreased coordination and unbalanced muscle activation, decreased attention to Rt, impaired sensation in RUE, decreased attention and safety awareness, impulsivity, and impaired timing and sequencing and therefore will continue to benefit from skilled OT intervention to enhance overall performance with BADL and Reduce care partner burden.  Patient progressing toward long term goals..  Continue plan of care.  OT Short Term Goals Week 1:  OT Short Term Goal 1 (Week 1): Pt will self feed with right hand with build up utensil with setup with extra time for 50% of meal OT Short Term Goal 1 - Progress (Week 1): Met OT Short Term Goal 2 (Week 1): Pt will transfer stand pivot to toilet with min A  OT Short Term Goal 2 - Progress (Week 1): Met OT Short Term Goal 3 (Week 1): Pt will perform clothing management with bilateral UEs with steady A in standing  OT Short Term Goal  3 - Progress (Week 1): Partly met OT Short Term Goal 4 (Week 1): Pt will demonstrate bed mobility in prep for ADL without UE support with min A with extra time OT Short Term Goal 4 - Progress (Week 1): Met Week 2:  OT Short Term Goal 1 (Week 2): Pt will perform clothing management with alternating UE support (for safety) with steady A in standing  OT Short Term Goal 2 (Week 2): Pt will complete shower transfer with supervision with <25% cues for safety. OT Short Term Goal 3 (Week 2): Pt will complete toilet transfer with supervision with <25% cues for safety. OT Short Term Goal 4 (Week 2): Pt will visually attend to RUE during bathing task with min verbal cues to increase coordination.  Skilled Therapeutic Interventions/Progress Updates:    1) Pt in bed upon arrival, performed squat pivot transfer bed > w/c with min assist and propelled w/c to room shower and setup w/c without verbal cues.  Engaged in shower tasks at sit > stand level requiring min assist for standing balance. Pt continues to have difficulty maintaining grasp on wash cloth (dropping 5 times) due to decreased sensation, educated on visually attending to Rt hand. Verbal cues provided to focus on buttocks positioning while sitting on shower chair to increase safety.  Min assist for LB dressing with pt alternating UE support as unable to maintain standing without UE support due to BLE weakness and fear of falling.  Additional time needed to tie shoe laces due to limited functional fine motor skills.  Applied shoe buttons on Pt's shoes and  educated on proper application, Pt satisfied and demonstrated task correctly.  Pt verbalized steps to therapist while demonstrating safe bed mobility and functional transfers from w/c > EOB > supine and back into w/c with close supervision.    2)Engaged in toilet transfer in ADL apt to simulate pt's home environment, educated on appropriate placement of w/c in relation to toilet to increase safety with  transfer.  Pt reports having grab bar next to toilet, educated on appropriate hand placement and use of grab bars during transfer.   Engaged in therapeutic activity to scoop pegs out of bowl with built up utensil handle in right hand to simulate ADL feeding task to focus on improving grasp habits.  Reiterated focusing on utensil positioning within hand and fingers due to limited sensation in right 2nd and 3rd digits.  Completed clothespin activity in standing at tabletop with focus on standing balance, endurance, UE use, as well as grasp and pinch to improve utensil control and grasp required for feeding tasks.  Pt tolerated standing four mins x2 with min verbal cues for upright standing, provided mirror for visual input to increase stance.  Therapy Documentation Precautions:  Precautions Precautions: Fall Precaution Comments: Loop recorder Restrictions Weight Bearing Restrictions: No General:   Vital Signs: Therapy Vitals Temp: 97.9 F (36.6 C) Temp src: Oral Pulse Rate: 51 Resp: 18 BP: 159/62 mmHg Patient Position (if appropriate): Sitting Oxygen Therapy SpO2: 95 % O2 Device: None (Room air) Pain: Pain Assessment Pain Score: 0-No pain ADL: ADL ADL Comments: see FIM  See FIM for current functional status  Therapy/Group: Individual Therapy  Simonne Come 01/14/2014, 3:19 PM

## 2014-01-15 ENCOUNTER — Encounter (HOSPITAL_COMMUNITY): Payer: Medicare Other | Admitting: Occupational Therapy

## 2014-01-15 DIAGNOSIS — I10 Essential (primary) hypertension: Secondary | ICD-10-CM

## 2014-01-15 DIAGNOSIS — F3289 Other specified depressive episodes: Secondary | ICD-10-CM

## 2014-01-15 DIAGNOSIS — I359 Nonrheumatic aortic valve disorder, unspecified: Secondary | ICD-10-CM

## 2014-01-15 DIAGNOSIS — F329 Major depressive disorder, single episode, unspecified: Secondary | ICD-10-CM

## 2014-01-15 NOTE — Progress Notes (Signed)
Calvin Merritt is a 73 y.o. male 11-09-1940 [id number]  Subjective: No new complaints. No new problems. Slept well. Feeling OK.  Objective: Vital signs in last 24 hours: Temp:  [97.7 F (36.5 C)-98.6 F (37 C)] 98.6 F (37 C) (08/29 0545) Pulse Rate:  [51-66] 57 (08/29 0545) Resp:  [18] 18 (08/29 0545) BP: (131-159)/(62-67) 131/64 mmHg (08/29 0545) SpO2:  [93 %-98 %] 93 % (08/29 0545) Weight change:  Last BM Date: 01/12/14  Intake/Output from previous day: 08/28 0701 - 08/29 0700 In: 840 [P.O.:840] Out: 950 [Urine:950] Last cbgs: CBG (last 3)  No results found for this basename: GLUCAP,  in the last 72 hours   Physical Exam General: No apparent distress   HEENT: not dry Lungs: Normal effort. Lungs clear to auscultation, no crackles or wheezes. Cardiovascular: Regular rate and rhythm, no edema Abdomen: S/NT/ND; BS(+) Musculoskeletal:  unchanged Neurological: No new neurological deficits Wounds: N/A    Skin: clear  Aging changes Mental state: Alert, oriented, cooperative    Lab Results: BMET    Component Value Date/Time   NA 143 01/10/2014 0706   K 4.8 01/10/2014 0706   CL 107 01/10/2014 0706   CO2 25 01/10/2014 0706   GLUCOSE 98 01/10/2014 0706   BUN 25* 01/10/2014 0706   CREATININE 1.37* 01/12/2014 0752   CALCIUM 9.7 01/10/2014 0706   GFRNONAA 50* 01/12/2014 0752   GFRAA 57* 01/12/2014 0752   CBC    Component Value Date/Time   WBC 12.1* 01/06/2014 0500   RBC 4.98 01/06/2014 0500   HGB 14.8 01/06/2014 0500   HCT 43.5 01/06/2014 0500   PLT 279 01/06/2014 0500   MCV 87.3 01/06/2014 0500   MCH 29.7 01/06/2014 0500   MCHC 34.0 01/06/2014 0500   RDW 13.2 01/06/2014 0500   LYMPHSABS 4.5* 01/06/2014 0500   MONOABS 1.0 01/06/2014 0500   EOSABS 0.4 01/06/2014 0500   BASOSABS 0.0 01/06/2014 0500    Studies/Results: No results found.  Medications: I have reviewed the patient's current medications.  Assessment/Plan:  1. Functional deficits secondary to bilateral embolic  infarcts, right MCA > left parietal distribution also with hereditary spastic paraplegia  2. DVT Prophylaxis/Anticoagulation: Pharmaceutical: Lovenox  3. Pain Management: Tylenol prn.  4. Depression/Mood: Willing to start antidepressant. Willing to talk to psychologist about current impairments as well as issues regarding ex-wife.  5. Neuropsych: This patient is capable of making decisions on his own behalf.  6. Skin/Wound Care: Routine pressure relief measures. Maintain adequate hydration and nutritional status.  7. Dyslipidemia: On lipitor  8. Bowel doing better make coace prn  9. HTN- was on lotensin, furosemide and amlodipine at home, meds held in acute care, BPs fluctuating restarted amlodipine at a lower dose, hold off on lotensin and furosemide (inc Creat),now normotensive  10. Aortic stenosis, f/u cardiology as outpt  LOS (Days) 9  Cont Rx     Length of stay, days: 10  Sonda Primes , MD 01/15/2014, 8:57 AM

## 2014-01-15 NOTE — Progress Notes (Signed)
Occupational Therapy Session Note  Patient Details  Name: Calvin Merritt MRN: [id number] Date of Birth: Aug 22, 1940  Today's Date: 01/15/2014 OT Individual Time: 0901-1001 OT Individual Time Calculation (min): 60 min    Short Term Goals: Week 2:  OT Short Term Goal 1 (Week 2): Pt will perform clothing management with alternating UE support (for safety) with steady A in standing  OT Short Term Goal 2 (Week 2): Pt will complete shower transfer with supervision with <25% cues for safety. OT Short Term Goal 3 (Week 2): Pt will complete toilet transfer with supervision with <25% cues for safety. OT Short Term Goal 4 (Week 2): Pt will visually attend to RUE during bathing task with min verbal cues to increase coordination.  Skilled Therapeutic Interventions/Progress Updates:    Pt performed shower transfer from EOB with RW and min assist.  Pt maintains flexed posture in his knees and hips requiring max instructional cueing to stand up straighter.  Used weighted walker to assist with ataxia with pt needing increased time to advance it, especially with turns.  Pt bathed sit to stand with min assist.  Min instructional cueing to remember to wash his bottom.  Decreased coordination noted in the right hand when attempting to wash with pt dropping washcloth 2 times during session.  Pt also easily distracted with conversation requiring mod instructional cueing to maintain selective attention in min distracting environment.  He ambulated back out to the wheelchair and completed grooming tasks in sitting then progressed to dressing.  Increased difficulty initially when attempting to thread his underpants over his feet but he was able to complete with increased time and demonstrated awareness of starting with the RLE first.  He was able to donn socks and shoes by crossing LEs and used shoe buttons with min instructional cueing to grasp the right string.    Therapy Documentation Precautions:   Precautions Precautions: Fall Precaution Comments: Loop recorder Restrictions Weight Bearing Restrictions: No  Pain: Pain Assessment Pain Assessment: No/denies pain ADL: See FIM for current functional status  Therapy/Group: Individual Therapy  [name] OTR/L 01/15/2014, 12:28 PM

## 2014-01-16 ENCOUNTER — Inpatient Hospital Stay (HOSPITAL_COMMUNITY): Payer: Medicare Other

## 2014-01-16 DIAGNOSIS — G114 Hereditary spastic paraplegia: Secondary | ICD-10-CM

## 2014-01-16 NOTE — Progress Notes (Signed)
Physical Therapy Session Note  Patient Details  Name: Calvin Merritt MRN: [id number] Date of Birth: 09/01/1940  Today's Date: 01/16/2014 PT Individual Time: 1400-1500 PT Individual Time Calculation (min): 60 min   Short Term Goals: Week 2:  PT Short Term Goal 1 (Week 2): Pt will consistently perform supine>sit with supervision with HOB flat, no rails. PT Short Term Goal 2 (Week 2): Pt will consistently perform sit>stand from w/c with supervision. PT Short Term Goal 3 (Week 2): Pt will perform gait x50' in controlled environment with min A using LRAD. PT Short Term Goal 4 (Week 2): Pt will manage all w/c parts without assistance/cueing.     Skilled Therapeutic Interventions/Progress Updates:  W/c propulsion x 160' x 2 using bil UEs with extra time, supervision, cues for forward gaze.  W/c> mat squat pivot transfer to R with clsoe supervision, mod cues for sequencing.  Pt originally attempted to transfer without positioning w/c in optimal position. Mat> w/c with close supervision, ballistic movements with safety concerns.  neuromuscular re-education via demo, VCs, for trunk shortening/lengthing/rotating  in unsupported sitting during reaching out of BOS with R or L hand, with occasional LOB backwards catching himself with L hand, and for wt shifting normal movement patterns sit> stand from, and eccentric control stand> sit from raised mat.  Gait x 90', x 20' x 2 with RW with min > mod assist on straight path, mod assist on turns.R hand slipped off of grip of RW once, with sudden LOB to R requiring mod assist. Gait with ACE on R foot and 1# wt on R ankle.  Pt has difficulty remembering sequence of steps when using RW, requiring constant cueing to step with R foot after RW, and step past with LLE. Up/down ramp with min assist, and down/up curb with +2 assist for RW and LE stability.  Pt returned to room and all needs left within reach.    Therapy Documentation Precautions:   Precautions Precautions: Fall Precaution Comments: Loop recorder Restrictions Weight Bearing Restrictions: No   Pain: Pain Assessment Pain Assessment: No/denies pain     See FIM for current functional status  Therapy/Group: Individual Therapy  Hadden Steig 01/16/2014, 4:21 PM

## 2014-01-16 NOTE — Progress Notes (Signed)
Calvin Merritt is a 73 y.o. male 07-23-1940 [id number]  Subjective: No new complaints. No new problems. Slept well.  Objective: Vital signs in last 24 hours: Temp:  [97.9 F (36.6 C)-98.5 F (36.9 C)] 98.4 F (36.9 C) (08/30 0545) Pulse Rate:  [48-54] 48 (08/30 0545) Resp:  [16-17] 17 (08/30 0545) BP: (136-170)/(63-73) 170/63 mmHg (08/30 0545) SpO2:  [95 %-96 %] 96 % (08/30 0545) Weight change:  Last BM Date: 01/15/14  Intake/Output from previous day: 08/29 0701 - 08/30 0700 In: 840 [P.O.:840] Out: 1150 [Urine:1150] Last cbgs: CBG (last 3)  No results found for this basename: GLUCAP,  in the last 72 hours   Physical Exam General: No apparent distress   HEENT: not dry Lungs: Normal effort. Lungs clear to auscultation, no crackles or wheezes. Cardiovascular: Regular rate and rhythm, no edema Abdomen: S/NT/ND; BS(+) Musculoskeletal:  unchanged Neurological: No new neurological deficits Wounds: N/A    Skin: clear  Aging changes Mental state: Alert, oriented, cooperative    Lab Results: BMET    Component Value Date/Time   NA 143 01/10/2014 0706   K 4.8 01/10/2014 0706   CL 107 01/10/2014 0706   CO2 25 01/10/2014 0706   GLUCOSE 98 01/10/2014 0706   BUN 25* 01/10/2014 0706   CREATININE 1.37* 01/12/2014 0752   CALCIUM 9.7 01/10/2014 0706   GFRNONAA 50* 01/12/2014 0752   GFRAA 57* 01/12/2014 0752   CBC    Component Value Date/Time   WBC 12.1* 01/06/2014 0500   RBC 4.98 01/06/2014 0500   HGB 14.8 01/06/2014 0500   HCT 43.5 01/06/2014 0500   PLT 279 01/06/2014 0500   MCV 87.3 01/06/2014 0500   MCH 29.7 01/06/2014 0500   MCHC 34.0 01/06/2014 0500   RDW 13.2 01/06/2014 0500   LYMPHSABS 4.5* 01/06/2014 0500   MONOABS 1.0 01/06/2014 0500   EOSABS 0.4 01/06/2014 0500   BASOSABS 0.0 01/06/2014 0500    Studies/Results: No results found.  Medications: I have reviewed the patient's current medications.  Assessment/Plan:  1. Functional deficits secondary to bilateral embolic  infarcts, right MCA > left parietal distribution also with hereditary spastic paraplegia  2. DVT Prophylaxis/Anticoagulation: Pharmaceutical: Lovenox  3. Pain Management: Tylenol prn.  4. Depression/Mood: Willing to start antidepressant. Willing to talk to psychologist about current impairments as well as issues regarding ex-wife.  5. Neuropsych: This patient is capable of making decisions on his own behalf.  6. Skin/Wound Care: Routine pressure relief measures. Maintain adequate hydration and nutritional status.  7. Dyslipidemia: On lipitor  8. Bowel doing better make coace prn  9. HTN- was on lotensin, furosemide and amlodipine at home, meds held in acute care, BPs fluctuating restarted amlodipine at a lower dose, hold off on lotensin and furosemide (inc Creat),now normotensive  10. Aortic stenosis, f/u cardiology as outpt  LOS (Days) 9  Cont current Rx     Length of stay, days: 11  Sonda Primes , MD 01/16/2014, 8:42 AM

## 2014-01-17 ENCOUNTER — Encounter (HOSPITAL_COMMUNITY): Payer: Medicare Other | Admitting: Occupational Therapy

## 2014-01-17 ENCOUNTER — Inpatient Hospital Stay (HOSPITAL_COMMUNITY): Payer: Medicare Other | Admitting: Physical Therapy

## 2014-01-17 ENCOUNTER — Inpatient Hospital Stay (HOSPITAL_COMMUNITY): Payer: Medicare Other | Admitting: Occupational Therapy

## 2014-01-17 DIAGNOSIS — F3289 Other specified depressive episodes: Secondary | ICD-10-CM

## 2014-01-17 DIAGNOSIS — I6340 Cerebral infarction due to embolism of unspecified cerebral artery: Secondary | ICD-10-CM

## 2014-01-17 DIAGNOSIS — Z5189 Encounter for other specified aftercare: Secondary | ICD-10-CM

## 2014-01-17 DIAGNOSIS — F329 Major depressive disorder, single episode, unspecified: Secondary | ICD-10-CM

## 2014-01-17 MED ORDER — AMLODIPINE BESYLATE 10 MG PO TABS
10.0000 mg | ORAL_TABLET | Freq: Every day | ORAL | Status: DC
  Administered 2014-01-18 – 2014-01-22 (×5): 10 mg via ORAL
  Filled 2014-01-17 (×7): qty 1

## 2014-01-17 NOTE — Progress Notes (Signed)
I have reviewed and agree with the attached treatment note.  Addis Bennie, OTR/L 

## 2014-01-17 NOTE — Progress Notes (Signed)
Subjective/Complaints: 73 y.o. male with history of Hep C, familial spastic paraparesis with gait disorder, HTN, who was admitted on 12/31/13 with right sided weakness and slurred speech. Patient reported that he had difficulty turning the key or gripping the wheel in his truck for about 30 minutes then fell out of his truck while trying to get out and struck his head. EMS activated and CT head in ED negative for hemorrhage. MRI brain with patchy areas of acute infarction in the left MCA territory, predominantly involving the parietal lobe and small acute right parietal infarct. MRA brain with 4 mm abnormality along the posterior aspect of the proximal right PCA described on CTA remains indeterminate with question of partially thrombosed aneurysm. 2D echo with EF 55-60%, moderate AVS,with grade 2 diastolic dysfunction. Carotid dopplers without significant ICA stenosis.    Pt had about 1 hr therapy yest Walked with Rollator PTA Now using Rolling walker    Review of Systems - Negative except both legs weak, constipation   Objective: Vital Signs: Blood pressure 166/83, pulse 60, temperature 98.2 F (36.8 C), temperature source Oral, resp. rate 16, weight 81.8 kg (180 lb 5.4 oz), SpO2 91.00%. No results found. No results found for this or any previous visit (from the past 72 hour(s)).   HEENT: normal Cardio: RRR and Gr 3/6 SEM Resp: CTA B/L and unlabored GI: BS positive and NT, ND Extremity:  Pulses positive and No Edema Skin:   Intact Neuro: Alert/Oriented, Cranial Nerve II-XII normal, Abnormal Sensory reduced sensation in RUE and RLE to LT, Abnormal Motor 5/5 LUE, 4/5 RUE, 3/5 BLE and Tone:  increased bilateral LE extensor tone, MAS 2-3 Musc/Skel:  Other decreased ROM bilateral Hips, Knees, ankles Gen NAD   Assessment/Plan: 1. Functional deficits secondary to Right MCA infarct which require 3+ hours per day of interdisciplinary therapy in a comprehensive inpatient rehab  setting. Physiatrist is providing close team supervision and 24 hour management of active medical problems listed below. Physiatrist and rehab team continue to assess barriers to discharge/monitor patient progress toward functional and medical goals.  FIM: FIM - Bathing Bathing Steps Patient Completed: Chest;Right Arm;Left Arm;Abdomen;Front perineal area;Buttocks;Right upper leg;Left upper leg;Right lower leg (including foot);Left lower leg (including foot) Bathing: 4: Min-Patient completes 8-9 72f 10 parts or 75+ percent  FIM - Upper Body Dressing/Undressing Upper body dressing/undressing steps patient completed: Thread/unthread right sleeve of pullover shirt/dresss;Thread/unthread left sleeve of pullover shirt/dress;Put head through opening of pull over shirt/dress;Pull shirt over trunk Upper body dressing/undressing: 5: Supervision: Safety issues/verbal cues FIM - Lower Body Dressing/Undressing Lower body dressing/undressing steps patient completed: Thread/unthread right underwear leg;Thread/unthread left underwear leg;Pull underwear up/down;Thread/unthread right pants leg;Thread/unthread left pants leg;Pull pants up/down;Fasten/unfasten pants;Don/Doff right sock;Don/Doff left sock;Don/Doff right shoe;Don/Doff left shoe;Fasten/unfasten right shoe;Fasten/unfasten left shoe Lower body dressing/undressing: 4: Steadying Assist  FIM - Toileting Toileting steps completed by patient: Adjust clothing prior to toileting;Performs perineal hygiene Toileting Assistive Devices: Grab bar or rail for support Toileting: 3: Mod-Patient completed 2 of 3 steps  FIM - Diplomatic Services operational officer Devices: Grab bars Toilet Transfers: 3-To toilet/BSC: Mod A (lift or lower assist);4-From toilet/BSC: Min A (steadying Pt. > 75%)  FIM - Banker Devices: Walker;Arm rests Bed/Chair Transfer: 5: Bed > Chair or W/C: Supervision (verbal cues/safety issues);5:  Chair or W/C > Bed: Supervision (verbal cues/safety issues) (squat pivot)  FIM - Locomotion: Wheelchair Distance: 200 Locomotion: Wheelchair: 5: Travels 150 ft or more: maneuvers on rugs and over door sills  with supervision, cueing or coaxing FIM - Locomotion: Ambulation Locomotion: Ambulation Assistive Devices: Walker - Rolling Ambulation/Gait Assistance: 4: Min assist;3: Mod assist Locomotion: Ambulation: 2: Travels 50 - 149 ft with moderate assistance (Pt: 50 - 74%)  Comprehension Comprehension Mode: Auditory Comprehension: 5-Follows basic conversation/direction: With no assist  Expression Expression Mode: Verbal Expression: 5-Expresses basic needs/ideas: With no assist  Social Interaction Social Interaction: 6-Interacts appropriately with others with medication or extra time (anti-anxiety, antidepressant).  Problem Solving Problem Solving: 5-Solves basic 90% of the time/requires cueing < 10% of the time  Memory Memory: 5-Recognizes or recalls 90% of the time/requires cueing < 10% of the time  Medical Problem List and Plan: 1. 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  DVT Prophylaxis/Anticoagulation: Pharmaceutical: Lovenox 3. Pain Management: Tylenol prn.   4. Depression/Mood: Willing to start antidepressant. Willing to talk to psychologist about current impairments  as well as issues regarding ex-wife.   5. Neuropsych: This patient is capable of making decisions on his own behalf. 6. Skin/Wound Care: Routine pressure relief measures. Maintain adequate hydration and nutritional status.   7. Dyslipidemia: On lipitor 8.  Bowel colace prn 9.  HTN- was on lotensin, furosemide and amlodipine at home, meds held in acute care, BPs fluctuating  restarted amlodipine will increase to home dose, hold off on lotensin and furosemide (inc Creat),now normotensive 10.  Aortic stenosis, f/u  cardiology as outpt LOS (Days) 12 A FACE TO FACE EVALUATION WAS PERFORMED  Calvin Merritt 01/17/2014, 7:56 AM

## 2014-01-17 NOTE — Progress Notes (Signed)
Occupational Therapy Session Note  Patient Details  Name: Calvin Merritt MRN: [id number] Date of Birth: Sep 08, 1940  Today's Date: 01/17/2014 OT Individual [phone] and  [id number] OT Individual Time Calculation (min): 49 min and 47 min    Short Term Goals: Week 2:  OT Short Term Goal 1 (Week 2): Pt will perform clothing management with alternating UE support (for safety) with steady A in standing  OT Short Term Goal 2 (Week 2): Pt will complete shower transfer with supervision with <25% cues for safety. OT Short Term Goal 3 (Week 2): Pt will complete toilet transfer with supervision with <25% cues for safety. OT Short Term Goal 4 (Week 2): Pt will visually attend to RUE during bathing task with min verbal cues to increase coordination.  Skilled Therapeutic Interventions/Progress Updates:    1)Engaged in proper bed mobility from flat bed from supine > sitting > EOB at supervision level, discussing bed setup at home.  Min A required when transferring from EOB > w/c and when sit > stand during ADL bathing and grooming tasks at sink, due to increased impulsivity this session.  Focused on visually attending to RUE during bathing and grooming tasks with min verbal cues provided, while grasping wash cloth (dropped wash cloth 1x) and to button shirt.  While engaging in clothing management, Pt alternated UE for support with increased awareness of safety during task.  Verbal cue needed to pull shorts up over right hip after donning shorts and fastening belt.  Educated Pt on functional techniques to button shirt while enhancing FM skills due to decreased coordination and sensation in right hand.    2) Engaged in ADL toileting task in hospital room when Pt needed to void, requiring min A with w/c > toilet transfers.  Pt initially attempted to stand to complete toileting task which created an unsafe environment due to the lack of thought processing and impulsivity.  Therapist verbalized and encouraged Pt  to refer back to last Friday's session's techniques (positioning w/c close to toilet and sitting on toilet first before removing pants and briefs) while reeducating on safety awareness regarding squat-pivot transfers from w/c > toilet and body positioning.  Pt had to sit back down on toilet six times and required additional time when attempting to stand to pull up pants after toileting.  Pt with heavy dependency of grab bar in standing with clothing management, pt reports having a grab bar on Lt in bathroom at home. In therapy gym, focused on increasing standing balance and tolerance while alternating use of BUE during table-top activity.  Encouraged increased use of RUE to promote increased coordination during reaching task.  Pt stood during activity for 10 minutes at supervision level, with verbal cues provided to decrease flexed knees and flexed posture.  Therapy Documentation Precautions:  Precautions Precautions: Fall Precaution Comments: Loop recorder Restrictions Weight Bearing Restrictions: No General:   Vital Signs: Therapy Vitals Temp: 99 F (37.2 C) Temp src: Oral Pulse Rate: 58 Resp: 16 BP: 163/71 mmHg Patient Position (if appropriate): Lying Oxygen Therapy SpO2: 93 % Pain:  Pt with no c/o pain ADL: ADL ADL Comments: see FIM  See FIM for current functional status  Therapy/Group: Individual Therapy  Crescencio Jozwiak 01/17/2014, 3:56 PM

## 2014-01-17 NOTE — Progress Notes (Addendum)
Physical Therapy Session Note  Patient Details  Name: Calvin Merritt MRN: [id number] Date of Birth: 03-Jan-1941  Today's Date: 01/17/2014 PT Individual Time: 1478-2956 and 1440-1510 PT Individual Time Calculation (min): 60 min and 30 min  Short Term Goals: Week 2:  PT Short Term Goal 1 (Week 2): Pt will consistently perform supine>sit with supervision with HOB flat, no rails. PT Short Term Goal 2 (Week 2): Pt will consistently perform sit>stand from w/c with supervision. PT Short Term Goal 3 (Week 2): Pt will perform gait x50' in controlled environment with min A using LRAD. PT Short Term Goal 4 (Week 2): Pt will manage all w/c parts without assistance/cueing.  Skilled Therapeutic Interventions/Progress Updates:    Treatment Session 1: Pt received seated in w/c; agreeable to therapy. Session focused on assessing appropriateness for ankle foot orthotic(s), shoe modifications as well as increasing pt safety/independence with stand pivot transfers. Orthotist, Thayer Ohm, present assess/address pt gait abnormalities, need for AFO and/or toe cap on shoe(s). Thayer Ohm observed as pt performed gait x70' in controlled environment with rolling walker (weighted with 10 lbs to facilitate pt control of walker advancement) with supervision to min guard, verbal/tactile cueing for bilat hip/knee extension and forward gaze. During gait, pt demonstrates consistent ability to self-correct T foot placement when RLE excessively adducted. Patient, orthotist, and this therapist in agreement to pursue R ankle stabilizing orthosis (ASO) to address R ankle inversion during RLE stance, bilat toe caps on pt shoes to address bilat toe drag/catch with increased fatigue.  In rehab apartment, pt performed multiple squat pivot transfers from couch<>w/c<>bed with supervision, subtle to min cueing for setup. On apartment bed, pt performed supine<>sit with mod I, increased time. Transitioned to stand pivot transfer with rolling walker from  w/c<>couch with min A, tactile cueing for anterior weight shift, cueing for safe hand placement and safe proximity to surface prior to sitting. Pt again requesting to utilize rollator for ambulation upon D/C home. Therapist had frank discussion with pt concerning safety concerns with use of rollator, including near-fall. Pt verbalized understanding, agreement. May require reinforcement. Pt left seated in w/c in pt room with all needs within reach.  Treatment Session 2: Pt received seated in w/c; agreeable to therapy. Engaged pt in interactive discussion of assistive devices/DME at home. Recommended that pt ask daughter to bring in current w/c and rolling walker to ensure equipment is safe and fitting. Pt verbally agreed.   Orthotist, Thayer Ohm, present to assess pt gait with R ASO to address excessive R forefoot supination during RLE stance phase of gait (more prominent with fatigue). Pt performed gait x75' in controlled environment with rolling walker with 10 lb weights and R ASO requiring supervision during linear gait, min guard during turning. While wearing ASO, pt demonstrated increased safety with ambulation secondary to increased R ankle stability, less prominent R forefoot supination. Patient, orthotist, and this therapist all in agreement to continue plan for placement of bilat toe caps on shoes to decrease bilat toe catch/drag during gait. Session ended in pt room, where pt was left seated in w/c with all needs within reach.  Upgraded long term goals addressing gait distance and assistance required in all environments secondary to pt progressing quickly.   Therapy Documentation Precautions:  Precautions Precautions: Fall Precaution Comments: Loop recorder Restrictions Weight Bearing Restrictions: No Pain: Pain Assessment Pain Assessment: 0-10 Pain Score: 0-No pain  See FIM for current functional status  Therapy/Group: Individual Therapy  Dewon Mendizabal, Lorenda Ishihara 01/17/2014, 10:57 AM

## 2014-01-18 ENCOUNTER — Inpatient Hospital Stay (HOSPITAL_COMMUNITY): Payer: Medicare Other | Admitting: Occupational Therapy

## 2014-01-18 ENCOUNTER — Encounter (HOSPITAL_COMMUNITY): Payer: Medicare Other | Admitting: Occupational Therapy

## 2014-01-18 ENCOUNTER — Inpatient Hospital Stay (HOSPITAL_COMMUNITY): Payer: Medicare Other | Admitting: Physical Therapy

## 2014-01-18 ENCOUNTER — Inpatient Hospital Stay (HOSPITAL_COMMUNITY): Payer: Medicare Other | Admitting: *Deleted

## 2014-01-18 DIAGNOSIS — F329 Major depressive disorder, single episode, unspecified: Secondary | ICD-10-CM

## 2014-01-18 DIAGNOSIS — I6340 Cerebral infarction due to embolism of unspecified cerebral artery: Secondary | ICD-10-CM

## 2014-01-18 DIAGNOSIS — Z5189 Encounter for other specified aftercare: Secondary | ICD-10-CM

## 2014-01-18 DIAGNOSIS — F3289 Other specified depressive episodes: Secondary | ICD-10-CM

## 2014-01-18 NOTE — Progress Notes (Signed)
I have reviewed and agree with the attached treatment note.  Rosario Duey, OTR/L 

## 2014-01-18 NOTE — Plan of Care (Signed)
Problem: RH Ambulation Goal: LTG Patient will ambulate in home environment (PT) LTG: Patient will ambulate in home environment, # of feet with assistance (PT).  Outcome: Not Applicable Date Met:  93/81/01 N/A secondary to recommendation for D/C home at w/c level.

## 2014-01-18 NOTE — Progress Notes (Signed)
Recreational Therapy Session Note  Patient Details  Name: Calvin Merritt MRN: [id number] Date of Birth: July 26, 1940 Today's Date: 01/18/2014  Pt with limited interest in TR services, no formal TR treatment eval/plan implemented. Datha Kissinger 01/18/2014, 3:34 PM

## 2014-01-18 NOTE — Progress Notes (Signed)
Occupational Therapy Session Note  Patient Details  Name: Calvin Merritt MRN: [id number] Date of Birth: 1940-09-14  Today's Date: 01/18/2014 OT Individual Time: 0865-7846 and 1400-1441 OT Individual Time Calculation (min): 64 min and 41 min    Short Term Goals: Week 2:  OT Short Term Goal 1 (Week 2): Pt will perform clothing management with alternating UE support (for safety) with steady A in standing  OT Short Term Goal 2 (Week 2): Pt will complete shower transfer with supervision with <25% cues for safety. OT Short Term Goal 3 (Week 2): Pt will complete toilet transfer with supervision with <25% cues for safety. OT Short Term Goal 4 (Week 2): Pt will visually attend to RUE during bathing task with min verbal cues to increase coordination.  Skilled Therapeutic Interventions/Progress Updates:    1) Engaged in self-care showering task with verbal cues to promote appropriate hand placement on shower grab bars when transferring from w/c > shower chair and when sit>stand to wash buttocks.  Pt concentrated on grasping and visually attending to wash cloth in right hand during bathing due to decreased sensation, dropping wash cloth 1x during this session (improved since previous session).  Pt was aware when this happened and corrected issue without assistance or verbal cue.  Pt dressed and finished grooming tasks at sink, continuing to alternate between RUE and LUE as support when sit>stand for standing balance and safety.  Verbal cuing needed in order to remind Pt to lock w/c brakes.  Pt continues to require additional time to button shorts due to decreased sensation, coordination, and FMC in RUE.  Re-educated pt on pinch technique, as used in previous session, to increase success with buttoning.  Pt was instructed on therapeutic exercises using theraputty while incorporating BUE that can be completed in Pt's room.  Focused on exercises to enhance [facility] skills and grasping patterns while holding an ink pen to  press patterns into theraputty.   2)Received Pt in room wearing new Rt ankle stabilizing orthosis (ASO).  Pt reported he had not been educated on the donning and doffing process of the new device, nor had he attempted (did not have time yet) to do so prior to this afternoon's session.  Focused on donning and doffing new orthotic during session while suggesting the use of a stool positioned under Pt's Rt foot to encourage appropriate ankle dorsiflexion when snapping device closure.  Pt required verbal and physical assistance while learning new task due to limited functional use of RUE (limited sensation, FM, and coordination) and with increased frustration.  Discussed with Pt plans to continue practicing donning and doffing of device in next session.  Pt engaged in ADL dressing task focusing on buttoning his shorts using BUE with use of full length mirror for visual input.  Encouraged Pt to utilize full length mirror placed in front of him while seated during task to provide visual feedback of hand placement and coordination.  Pt's time significantly decreased since last session and was successfully completed within 5 seconds using mirror.  Pt stated he has a full length mirror in bathroom at home.     Therapy Documentation Precautions:  Precautions Precautions: Fall Precaution Comments: Loop recorder Restrictions Weight Bearing Restrictions: No General:   Vital Signs: Therapy Vitals Temp: 98.5 F (36.9 C) Temp src: Oral Pulse Rate: 57 Resp: 17 BP: 152/70 mmHg Patient Position (if appropriate): Sitting Oxygen Therapy SpO2: 97 % O2 Device: None (Room air) Pain:   ADL: ADL ADL Comments: see FIM Exercises:  Other Treatments:    See FIM for current functional status  Therapy/Group: Individual Therapy  Osiel Stick 01/18/2014, 3:44 PM

## 2014-01-18 NOTE — Progress Notes (Signed)
Subjective/Complaints: 73 y.o. male with history of Hep C, familial spastic paraparesis with gait disorder, HTN, who was admitted on 12/31/13 with right sided weakness and slurred speech. Patient reported that he had difficulty turning the key or gripping the wheel in his truck for about 30 minutes then fell out of his truck while trying to get out and struck his head. EMS activated and CT head in ED negative for hemorrhage. MRI brain with patchy areas of acute infarction in the left MCA territory, predominantly involving the parietal lobe and small acute right parietal infarct. MRA brain with 4 mm abnormality along the posterior aspect of the proximal right PCA described on CTA remains indeterminate with question of partially thrombosed aneurysm. 2D echo with EF 55-60%, moderate AVS,with grade 2 diastolic dysfunction. Carotid dopplers without significant ICA stenosis.    No new issues per pt "I can't write my name"  Review of Systems - Negative except both legs weak, constipation   Objective: Vital Signs: Blood pressure 154/68, pulse 58, temperature 98.7 F (37.1 C), temperature source Oral, resp. rate 18, weight 81.8 kg (180 lb 5.4 oz), SpO2 98.00%. No results found. No results found for this or any previous visit (from the past 72 hour(s)).   HEENT: normal Cardio: RRR and Gr 3/6 SEM Resp: CTA B/L and unlabored GI: BS positive and NT, ND Extremity:  Pulses positive and No Edema Skin:   Intact Neuro: Alert/Oriented, Cranial Nerve II-XII normal, Abnormal Sensory reduced sensation in RUE and RLE to LT, astereognosis, Abnormal Motor 5/5 LUE, 4/5 RUE, 3/5 BLE and Tone:  increased bilateral LE extensor tone, MAS 2-3 Musc/Skel:  Other decreased ROM bilateral Hips, Knees, ankles Gen NAD   Assessment/Plan: 1. Functional deficits secondary to Right MCA infarct which require 3+ hours per day of interdisciplinary therapy in a comprehensive inpatient rehab setting. Physiatrist is providing  close team supervision and 24 hour management of active medical problems listed below. Physiatrist and rehab team continue to assess barriers to discharge/monitor patient progress toward functional and medical goals.  FIM: FIM - Bathing Bathing Steps Patient Completed: Chest;Right Arm;Left Arm;Abdomen;Buttocks;Front perineal area;Right upper leg;Left upper leg Bathing: 4: Min-Patient completes 8-9 83f 10 parts or 75+ percent  FIM - Upper Body Dressing/Undressing Upper body dressing/undressing steps patient completed: Thread/unthread right sleeve of pullover shirt/dresss;Thread/unthread left sleeve of pullover shirt/dress;Put head through opening of pull over shirt/dress;Pull shirt over trunk;Thread/unthread right sleeve of front closure shirt/dress;Thread/unthread left sleeve of front closure shirt/dress;Pull shirt around back of front closure shirt/dress;Button/unbutton shirt Upper body dressing/undressing: 5: Supervision: Safety issues/verbal cues FIM - Lower Body Dressing/Undressing Lower body dressing/undressing steps patient completed: Thread/unthread right underwear leg;Thread/unthread left underwear leg;Pull underwear up/down;Thread/unthread right pants leg;Thread/unthread left pants leg;Pull pants up/down;Don/Doff right sock;Don/Doff left sock;Fasten/unfasten pants;Don/Doff right shoe;Don/Doff left shoe;Fasten/unfasten right shoe;Fasten/unfasten left shoe Lower body dressing/undressing: 5: Supervision: Safety issues/verbal cues  FIM - Toileting Toileting steps completed by patient: Adjust clothing after toileting;Performs perineal hygiene;Adjust clothing prior to toileting Toileting Assistive Devices: Grab bar or rail for support Toileting: 4: Steadying assist  FIM - Diplomatic Services operational officer Devices: Grab bars Toilet Transfers: 4-To toilet/BSC: Min A (steadying Pt. > 75%);4-From toilet/BSC: Min A (steadying Pt. > 75%)  FIM - Bed/Chair Transfer Bed/Chair Transfer  Assistive Devices: Arm rests;Walker (walker weighted with 10 lbs) Bed/Chair Transfer: 4: Bed > Chair or W/C: Min A (steadying Pt. > 75%)  FIM - Locomotion: Wheelchair Distance: 60 Locomotion: Wheelchair: 2: Travels 50 - 149 ft with supervision, cueing or  coaxing FIM - Locomotion: Ambulation Locomotion: Ambulation Assistive Devices: Walker - Rolling;Other (comment) (R ASO; walker weighted with 10 lbs) Ambulation/Gait Assistance: 4: Min guard;5: Supervision Locomotion: Ambulation: 2: Travels 50 - 149 ft with minimal assistance (Pt.>75%)  Comprehension Comprehension Mode: Auditory Comprehension: 5-Follows basic conversation/direction: With no assist  Expression Expression Mode: Verbal Expression: 5-Expresses basic needs/ideas: With no assist  Social Interaction Social Interaction: 6-Interacts appropriately with others with medication or extra time (anti-anxiety, antidepressant).  Problem Solving Problem Solving: 5-Solves basic 90% of the time/requires cueing < 10% of the time  Memory Memory: 5-Recognizes or recalls 90% of the time/requires cueing < 10% of the time  Medical Problem List and Plan: 1. 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  DVT Prophylaxis/Anticoagulation: Pharmaceutical: Lovenox 3. Pain Management: Tylenol prn.   4. Depression/Mood: Willing to start antidepressant. Willing to talk to psychologist about current impairments  as well as issues regarding ex-wife.   5. Neuropsych: This patient is capable of making decisions on his own behalf. 6. Skin/Wound Care: Routine pressure relief measures. Maintain adequate hydration and nutritional status.   7. Dyslipidemia: On lipitor 8.  Bowel colace prn 9.  HTN- was on lotensin, furosemide and amlodipine at home, meds held in acute care, BPs fluctuating  restarted amlodipine will increase to home dose, hold off on lotensin and  furosemide (inc Creat),BPs overall improving 10.  Aortic stenosis, f/u cardiology as outpt LOS (Days) 13 A FACE TO FACE EVALUATION WAS PERFORMED  Cloris Flippo E 01/18/2014, 8:01 AM

## 2014-01-18 NOTE — Progress Notes (Signed)
Physical Therapy Session Note  Patient Details  Name: Calvin Merritt MRN: [id number] Date of Birth: 1941/01/30  Today's Date: 01/18/2014 PT Individual Time: 1035 -1135 (Co-tx with rec therapist) and 1330-1400 PT Individual Time Calculation (min): 60 min and 30 min  Short Term Goals: Week 2:  PT Short Term Goal 1 (Week 2): Pt will consistently perform supine>sit with supervision with HOB flat, no rails. PT Short Term Goal 2 (Week 2): Pt will consistently perform sit>stand from w/c with supervision. PT Short Term Goal 3 (Week 2): Pt will perform gait x50' in controlled environment with min A using LRAD. PT Short Term Goal 4 (Week 2): Pt will manage all w/c parts without assistance/cueing.  Skilled Therapeutic Interventions/Progress Updates:    Treatment Session 1: Co-tx with rec therapist focusing on community mobility, car transfers, and discharge planning. Pt wearing shoes with bilat toe caps (received from orthotist this AM). Performed w/c mobility >200' in controlled, home, and community environments with bilat UE's and supervision, min cueing for visual attention to RUE. Inside/outside hospital lobby, pt performed gait x45' in community environment (over tile inside lobby, over asphalt, pavers outside lobby) with weighted rolling walker and supervision to min guard; single R toe catch with effective self-recovery. During gait trial, pt required cueing required for bilat hip/knee extension during stance of respective LE, bilat hip/knee flexion during advancement of LE. Returned to rehab unit, where pt performed stand pivot transfer from w/c<>simualted car with weighted rolling walker and close supervision, verbal cueing for safe hand placement; will need reinforcement.   RT and this PT engaged pt in interactive discussion regarding recommendation for 24/7 supervision at D/C. Pt reporting suspected inability to find/hire help, and therefore plans on having friends/neighbor periodically check on him  at home. Reiterated recommendation for 24/7 supervision at D/C secondary to safety concerns. Based on pt report of no supervision expected at D/C, also explained recommendation that pt utilize w/c for all mobility. Pt verbalized understanding but not fully in agreement. CSW notified of conversation. Session ended in pt room, where pt was left seated in w/c with all needs within reach.  Treatment Session 2: 1:1. Pt received seated in w/c; agreeable to therapy. Session focused on fall recovery, increasing pt independence with donning/doffing R foot brace. Performed w/c x150' in controlled environment with bilat UE's and supervision, cueing for visual attention to RUE. In rehab apartment, performed squat pivot transfer from w/c<>couch with close supervision, min cueing for w/c parts management (safe proximity of w/c to couch, consistently locking w/c brakes and leg rests). Educated pt on fall recovery, situations in which to activate EMS after fall. Reiterated explanation, demonstration of floor transfer (seated on couch<>supine on floor) for fall recovery and quadruped/tall kneeling positions to elicit LUE/LLE weightbearing proprioception. Pt then performed floor transfer with mod A during initial, min guard during subsequent trial.   Per orthotist, Thayer Ohm, pt with increased difficulty independently donning R foot brace this AM. Therefore, orthotist and PT in agreement to modify to R Foot Up brace to increase pt compliance with brace at D/C; pt in agreement. Orthotist present to fit R Foot Up brace. Due to time constraint, will plan to assess pt independence donning/doffing brace as well as gait stability wearing brace during treatment session tomorrow. Pt left seated in w/c with orthotist present, fitting R foot brace.  The following long term goals were downgraded due to decreased safety awareness: transfers, w/c mobility, and ramp negotiation. Discharged long term goals addressing gait in home and community  environments secondary to recommendation for supervision at w/c level. Added LTG for floor transfer secondary to history of falls. Therapy Documentation Precautions:  Precautions Precautions: Fall Precaution Comments: Loop recorder Restrictions Weight Bearing Restrictions: No General:   Vital Signs: Therapy Vitals BP: 149/71 mmHg Pain: Pain Assessment Pain Assessment: No/denies pain Pain Score: 0-No pain Mobility:   Locomotion : Ambulation Ambulation/Gait Assistance: 4: Min guard;5: Supervision Wheelchair Mobility Distance: 150  Trunk/Postural Assessment :    Balance:   Exercises:   Other Treatments:    See FIM for current functional status  Therapy/Group: Individual Therapy and Co-Treatment  Hobble, Blair A 01/18/2014, 11:56 AM

## 2014-01-18 NOTE — Plan of Care (Signed)
Problem: RH Ambulation Goal: LTG Patient will ambulate in home environment (PT) LTG: Patient will ambulate in home environment, # of feet with assistance (PT).  Outcome: Not Applicable Date Met:  45/73/34 N/A due to recommendation for patient to D/C at w/c level.

## 2014-01-19 ENCOUNTER — Inpatient Hospital Stay (HOSPITAL_COMMUNITY): Payer: Medicare Other

## 2014-01-19 ENCOUNTER — Inpatient Hospital Stay (HOSPITAL_COMMUNITY): Payer: Medicare Other | Admitting: Occupational Therapy

## 2014-01-19 ENCOUNTER — Inpatient Hospital Stay (HOSPITAL_COMMUNITY): Payer: Medicare Other | Admitting: Physical Therapy

## 2014-01-19 ENCOUNTER — Encounter (HOSPITAL_COMMUNITY): Payer: Medicare Other | Admitting: Occupational Therapy

## 2014-01-19 ENCOUNTER — Ambulatory Visit: Payer: Medicare Other

## 2014-01-19 LAB — CREATININE, SERUM
CREATININE: 1.37 mg/dL — AB (ref 0.50–1.35)
GFR calc Af Amer: 57 mL/min — ABNORMAL LOW (ref >90)
GFR, EST NON AFRICAN AMERICAN: 50 mL/min — AB (ref >90)

## 2014-01-19 NOTE — Progress Notes (Signed)
Occupational Therapy Session Note  Patient Details  Name: Calvin Merritt MRN: [id number] Date of Birth: [birth year]  Today's Date: 01/19/2014 OT Individual Time: 0915-1005 and 1350-1446 OT Individual Time Calculation (min): 50 min and 56 min   Short Term Goals: Week 2:  OT Short Term Goal 1 (Week 2): Pt will perform clothing management with alternating UE support (for safety) with steady A in standing  OT Short Term Goal 2 (Week 2): Pt will complete shower transfer with supervision with <25% cues for safety. OT Short Term Goal 3 (Week 2): Pt will complete toilet transfer with supervision with <25% cues for safety. OT Short Term Goal 4 (Week 2): Pt will visually attend to RUE during bathing task with min verbal cues to increase coordination.  Skilled Therapeutic Interventions/Progress Updates:   1) Pt reported practicing donning Rt ankle stabilizing orthosis (ASO) last night, and "does not like the brace."  Pt bathed, groomed, and dressed at room sink with sit>stand when doffing/donning underwear and shorts with close supervision for safety due to decreased standing balance.  Mod-max verbal cues provided for routine tasks: to remind Pt to lock w/c brakes during task, and also to visually attend to Rt hand placement on w/c wheel when Pt stated he was "caught on something" and could not roll w/c in desired direction.  Reeducated Pt on appropriate donning technique for Rt ASO, verbal questioning of steps during the task.  Pt insisted on donning device "his way" and remained frustrated when unable to complete task.  Therapist demonstrated steps from previous session and discussed plans to speak with PT regarding concerns due to poor fit in shoe.  2) Simulated toilet transfers using squat pivot from w/c <> toilet in ADL apartment bathroom.  Reeducated on w/c positioning in regards to toilet, appropriate placement of hands to reach for grab bars, BLE positioning, all to address safety while doing task  within home environment.  Encouraged Pt to verbalize sequencing steps before initiating next step during task, due to decreased thought processing and rapid movements when doing task for safety awareness.  Therapist applied white painted line at end of Rt ankle ASO clasp to provide visual contrast component and visual cue, enhancing success of fastening clasp.  Pt has been advised to not drive upon D/C due to safety risk.  Simple meal prep goal D/C due to Pt reporting he did not previously engage in ADL cooking task (he drove to obtain all meals) and does not plan to after D/C date.  Educated Pt on effective strategies (utilizing a weekly calendar and lists for groceries and meal planning) he can use to obtain meals and at home assistance using his support system on a consistent and reliable basis.  Pt stated he would not be doing laundry task when he returns home due to location of washer and dryer on home basement level.    Therapy Documentation Precautions:  Precautions Precautions: Fall Precaution Comments: Loop recorder Restrictions Weight Bearing Restrictions: No General:   Vital Signs: Therapy Vitals BP: 164/80 mmHg Pain:    No complaints of pain.  ADL: ADL ADL Comments: see FIM  See FIM for current functional status  Therapy/Group: Individual Therapy  Quay Simkin 01/19/2014, 11:57 AM

## 2014-01-19 NOTE — Progress Notes (Signed)
Subjective/Complaints: 73 y.o. male with history of Hep C, familial spastic paraparesis with gait disorder, HTN, who was admitted on 12/31/13 with right sided weakness and slurred speech. Patient reported that he had difficulty turning the key or gripping the wheel in his truck for about 30 minutes then fell out of his truck while trying to get out and struck his head. EMS activated and CT head in ED negative for hemorrhage. MRI brain with patchy areas of acute infarction in the left MCA territory, predominantly involving the parietal lobe and small acute right parietal infarct. MRA brain with 4 mm abnormality along the posterior aspect of the proximal right PCA described on CTA remains indeterminate with question of partially thrombosed aneurysm. 2D echo with EF 55-60%, moderate AVS,with grade 2 diastolic dysfunction. Carotid dopplers without significant ICA stenosis.    No problems overnite   Review of Systems - Negative except both legs weak, constipation   Objective: Vital Signs: Blood pressure 141/73, pulse 59, temperature 98.7 F (37.1 C), temperature source Oral, resp. rate 18, weight 81.8 kg (180 lb 5.4 oz), SpO2 97.00%. No results found. Results for orders placed during the hospital encounter of 01/05/14 (from the past 72 hour(s))  CREATININE, SERUM     Status: Abnormal   Collection Time    01/19/14  5:12 AM      Result Value Ref Range   Creatinine, Ser 1.37 (*) 0.50 - 1.35 mg/dL   GFR calc non Af Amer 50 (*) >90 mL/min   GFR calc Af Amer 57 (*) >90 mL/min   Comment: (NOTE)     The eGFR has been calculated using the CKD EPI equation.     This calculation has not been validated in all clinical situations.     eGFR's persistently <90 mL/min signify possible Chronic Kidney     Disease.     HEENT: normal Cardio: RRR and Gr 3/6 SEM Resp: CTA B/L and unlabored GI: BS positive and NT, ND Extremity:  Pulses positive and No Edema Skin:   Intact Neuro: Alert/Oriented, Cranial  Nerve II-XII normal, Abnormal Sensory reduced sensation in RUE and RLE to LT, astereognosis, Abnormal Motor 5/5 LUE, 4/5 RUE, 3/5 BLE and Tone:  increased bilateral LE extensor tone, MAS 2-3 Musc/Skel:  Other decreased ROM bilateral Hips, Knees, ankles Gen NAD   Assessment/Plan: 1. Functional deficits secondary to Right MCA infarct which require 3+ hours per day of interdisciplinary therapy in a comprehensive inpatient rehab setting. Physiatrist is providing close team supervision and 24 hour management of active medical problems listed below. Physiatrist and rehab team continue to assess barriers to discharge/monitor patient progress toward functional and medical goals.  FIM: FIM - Bathing Bathing Steps Patient Completed: Chest;Right Arm;Left Arm;Abdomen;Front perineal area;Buttocks;Right upper leg;Left upper leg;Right lower leg (including foot);Left lower leg (including foot) Bathing: 5: Supervision: Safety issues/verbal cues  FIM - Upper Body Dressing/Undressing Upper body dressing/undressing steps patient completed: Thread/unthread right sleeve of pullover shirt/dresss;Thread/unthread left sleeve of pullover shirt/dress;Put head through opening of pull over shirt/dress;Pull shirt over trunk Upper body dressing/undressing: 5: Set-up assist to: Obtain clothing/put away FIM - Lower Body Dressing/Undressing Lower body dressing/undressing steps patient completed: Thread/unthread right underwear leg;Thread/unthread left underwear leg;Pull underwear up/down;Thread/unthread right pants leg;Thread/unthread left pants leg;Pull pants up/down;Fasten/unfasten pants;Don/Doff right sock;Don/Doff left sock Lower body dressing/undressing: 5: Supervision: Safety issues/verbal cues  FIM - Toileting Toileting steps completed by patient: Adjust clothing after toileting;Performs perineal hygiene;Adjust clothing prior to toileting Toileting Assistive Devices: Grab bar or rail for  support Toileting: 4:  Steadying assist  FIM - Radio producer Devices: Grab bars Toilet Transfers: 4-To toilet/BSC: Min A (steadying Pt. > 75%);4-From toilet/BSC: Min A (steadying Pt. > 75%)  FIM - Bed/Chair Transfer Bed/Chair Transfer Assistive Devices: Bed rails;Arm rests;Walker (walker weighted with 10 lbs) Bed/Chair Transfer: 4: Bed > Chair or W/C: Min A (steadying Pt. > 75%);4: Chair or W/C > Bed: Min A (steadying Pt. > 75%)  FIM - Locomotion: Wheelchair Distance: 150 Locomotion: Wheelchair: 2: Travels 50 - 149 ft with supervision, cueing or coaxing FIM - Locomotion: Ambulation Locomotion: Ambulation Assistive Devices: Walker - Rolling;Other (comment) (walker weighted with 10 lbs; toe caps on bilat shoes) Ambulation/Gait Assistance: 4: Min guard;5: Supervision Locomotion: Ambulation: 1: Travels less than 50 ft with minimal assistance (Pt.>75%)  Comprehension Comprehension Mode: Auditory Comprehension: 5-Follows basic conversation/direction: With no assist  Expression Expression Mode: Verbal Expression: 5-Expresses basic needs/ideas: With no assist  Social Interaction Social Interaction: 6-Interacts appropriately with others with medication or extra time (anti-anxiety, antidepressant).  Problem Solving Problem Solving: 5-Solves basic 90% of the time/requires cueing < 10% of the time  Memory Memory: 5-Recognizes or recalls 90% of the time/requires cueing < 10% of the time  Medical Problem List and Plan: 1. 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  DVT Prophylaxis/Anticoagulation: Pharmaceutical: Lovenox 3. Pain Management: Tylenol prn.   4. Depression/Mood: Willing to start antidepressant. Willing to talk to psychologist about current impairments  as well as issues regarding ex-wife.   5. Neuropsych: This patient is capable of making decisions on his own behalf. 6.  Skin/Wound Care: Routine pressure relief measures. Maintain adequate hydration and nutritional status.   7. Dyslipidemia: On lipitor 8.  Bowel colace prn 9.  HTN- was on lotensin, furosemide and amlodipine at home, meds held in acute care, BPs fluctuating  restarted amlodipine will increase to home dose, hold off on lotensin and furosemide (inc Creat),BPs overall improving 10.  Aortic stenosis, f/u cardiology as outpt, pt unaware of diagnosis discussed this today LOS (Days) 14 A FACE TO FACE EVALUATION WAS [place] E 01/19/2014, 7:51 AM

## 2014-01-19 NOTE — Patient Care Conference (Signed)
Inpatient RehabilitationTeam Conference and Plan of Care Update Date: 01/19/2014   Time: 10;43 AM    Patient Name: Calvin Merritt      Medical Record Number: [id number]  Date of Birth: 1940/05/27 Sex: Male         Room/Bed: 4W23C/4W23C-01 Payor Info: Payor: [employer] copy[employer] MEDICARE / Plan: AARP MEDICARE COMPLETE / Product Type: *No Product type* /    Admitting Diagnosis: B CVAS   Admit Date/Time:  01/05/2014  8:29 PM Admission Comments: No comment available   Primary Diagnosis:  <principal problem not specified> Principal Problem: <principal problem not specified>  Patient Active Problem List   Diagnosis Date Noted  . 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 Depression 01/05/2014  . PFO (patent foramen ovale) 01/05/2014  . Aortic valve stenosis, moderate 01/05/2014  . CVA (cerebral infarction) 01/05/2014  . Cerebral embolism with cerebral infarction 12/31/2013  . Health care maintenance 08/21/2013  . HIP FRACTURE, LEFT 11/14/2009  . CERUMEN IMPACTION, BILATERAL 07/27/2008  . 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 OBSTRUCTIVE SLEEP APNEA 06/25/2007  . HYPERLIPIDEMIA 05/19/2007  . HYPERTENSION 05/19/2007    Expected Discharge Date: Expected Discharge Date: 01/22/14  Team Members Present: Physician leading conference: Dr. Claudette Laws Social Worker Present: Dossie Der, LCSW PT Present: Edman Circle, PT;Blair Hobble, Lillie Columbia, PT OT Present: Rosalio Loud, OT SLP Present: Jackalyn Lombard, SLP PPS Coordinator present : Tora Duck, RN, CRRN     Current Status/Progress Goal Weekly Team Focus  Medical   poor judgement but competant  improve safety awareness  d/c planning   Bowel/Bladder   continent of bowel and bladder. LBM 9/2  min assist  continue to monitor   Swallow/Nutrition/ Hydration     WFL        ADL's   min-supervision with self-care, min assist squat pivot transfers  supervision overall  safety with mobility and transfers, RUE NMR, d/c planning    Mobility   Supervision w/c mobility, Supervision-Min A transfers, Min A standing balance and gait  Downgraded to supervision at w/c level  Safety awareness, R NMR, standing balance, D/C planning, hands-on training with anticipated caregiver   Communication     [facility]        Safety/Cognition/ Behavioral Observations    no unsafe behaviors        Pain   no complaints of pain  2 or less  continue to assess and monitor   Skin   no breakdown  no new breakdown   monitor and assess q shift      *See Care Plan and progress notes for long and short-term goals.  Barriers to Discharge: chronic spastic hemiplegic    Possible Resolutions to Barriers:  see above    Discharge Planning/Teaching Needs:  Home alone with intermittent assist from his son. pt is aware of the recommnedation of 24 hr care.  He refuses NHP has been to one before and has no plans to go back      Team Discussion:  Pt insistent upon going home aware of the team's recommendation of supervision level.  He has balance, safety and awareness issues.  BP better restarting second BP med.  Try to get daughter or son in for family education.  Revisions to Treatment Plan:  Downgraded goals to supervision w/c level due to safety issues   Continued Need for Acute Rehabilitation Level of Care: The patient requires daily medical management by a physician with specialized training in physical medicine and rehabilitation for the following conditions: Daily  direction of a multidisciplinary physical rehabilitation program to ensure safe treatment while eliciting the highest outcome that is of practical value to the patient.: Yes Daily medical management of patient stability for increased activity during participation in an intensive rehabilitation regime.: Yes Daily analysis of laboratory values and/or radiology reports with any subsequent need for medication adjustment of medical intervention for : Neurological problems;Other;Cardiac  problems  Lucy Chris 01/21/2014, 9:09 AM

## 2014-01-19 NOTE — Progress Notes (Signed)
Physical Therapy Session Note  Patient Details  Name: Calvin Merritt MRN: [id number] Date of Birth: 11-29-1940  Today's Date: 01/19/2014 PT Individual Time: 1122-1207 PT Individual Time Calculation (min): 45 min   Short Term Goals: Week 2:  PT Short Term Goal 1 (Week 2): Pt will consistently perform supine>sit with supervision with HOB flat, no rails. PT Short Term Goal 2 (Week 2): Pt will consistently perform sit>stand from w/c with supervision. PT Short Term Goal 3 (Week 2): Pt will perform gait x50' in controlled environment with min A using LRAD. PT Short Term Goal 4 (Week 2): Pt will manage all w/c parts without assistance/cueing.  Skilled Therapeutic Interventions/Progress Updates:    Pt received seated in w/c; agreeable to therapy. Session focused on facilitating pt independence with donning/doffing R Foot Up brace, promoting pt safety with entering/exiting home. Verbally reiterated recommendation for 24/7 supervision and all mobility occuring at w/c level; pt not in agreement. Pt's daughter had not brought in DME, as planned. Therefore, unable to assess safety of equipment.   Pt reporting difficulty donning/doffing R Foot Up brace secondary to decreased FMC in R hand. Therefore, readjusted, secured brace to shoe; instructed pt to leave plastic hook secured to tongue of shoe and only adjust velcro straps on ankle when donning/doffing shoes. Pt verbalized understanding and was able to successfully don/doff brace in this manner after 3 attempts with concurrent cueing for technique.  Pt performed w/c mobility x180' in controlled environment with bilat UE's and supervision. In ortho gym, pt engaged in interactive discussion of plan for getting into/out primary home entrance. Pt informed this therapist for the first time today that he must negotiate one small step to enter front door threshold. Pt elaborated that he planned on negotiating threshold by sitting in w/c and pulling w/c through door  by holding onto door frame with UE's. Therapist explained significant risk involved in using said technique and instead recommended that pt learn to explain w/c technique to whomever is assisting pt into home. Pt agreeable. This PT explained, demonstrated technique for bumping empty w/c up/down small step curb. Pt then effectively verbalized 50% of steps required for w/c bumping. Will continue to reinforce. Pt left seated in w/c in room with all needs within reach. Notified CSW Kriste Basque of new information concerning home (small step at threshold) and concern for pt safety at D/C.    Therapy Documentation Precautions:  Precautions Precautions: Fall Precaution Comments: Loop recorder Restrictions Weight Bearing Restrictions: No Vital Signs: Therapy Vitals BP: 164/80 mmHg Pain: Pain Assessment Pain Assessment: No/denies pain Locomotion : Ambulation Ambulation/Gait Assistance: 5: Supervision;4: Min guard Wheelchair Mobility Distance: 180   See FIM for current functional status  Therapy/Group: Individual Therapy  Melida Northington, Lorenda Ishihara 01/19/2014, 12:50 PM

## 2014-01-19 NOTE — Progress Notes (Signed)
I have reviewed and agree with the attached treatment note.  Pedrohenrique [place], OTR/L 

## 2014-01-19 NOTE — Progress Notes (Signed)
Physical Therapy Session Note  Patient Details  Name: Calvin Merritt MRN: [id number] Date of Birth: 1940/05/26  Today's Date: 01/19/2014 PT Individual Time: 1259-1329 PT Individual Time Calculation (min): 30 min   Short Term Goals: Week 2:  PT Short Term Goal 1 (Week 2): Pt will consistently perform supine>sit with supervision with HOB flat, no rails. PT Short Term Goal 2 (Week 2): Pt will consistently perform sit>stand from w/c with supervision. PT Short Term Goal 3 (Week 2): Pt will perform gait x50' in controlled environment with min A using LRAD. PT Short Term Goal 4 (Week 2): Pt will manage all w/c parts without assistance/cueing.  Skilled Therapeutic Interventions/Progress Updates:   Denies pain. Session focused on functional transfers including simulated car transfer and furniture transfer to soft couch as well as w/c mobility through obstacle course and over mat to simulate home/community environment. Pt able to complete squat pivot transfers with close S to steady A to car and couch and managing w/c parts independently.Pt excited for d/c home at the end of the week.  Therapy Documentation Precautions:  Precautions Precautions: Fall Precaution Comments: Loop recorder Restrictions Weight Bearing Restrictions: No  See FIM for current functional status  Therapy/Group: Individual Therapy  Karolee Stamps [facility] 01/19/2014, 1:30 PM

## 2014-01-20 ENCOUNTER — Inpatient Hospital Stay (HOSPITAL_COMMUNITY): Payer: Medicare Other | Admitting: Physical Therapy

## 2014-01-20 ENCOUNTER — Inpatient Hospital Stay (HOSPITAL_COMMUNITY): Payer: Medicare Other | Admitting: Occupational Therapy

## 2014-01-20 ENCOUNTER — Encounter (HOSPITAL_COMMUNITY): Payer: Medicare Other | Admitting: Occupational Therapy

## 2014-01-20 ENCOUNTER — Telehealth: Payer: Self-pay | Admitting: Internal Medicine

## 2014-01-20 DIAGNOSIS — F3289 Other specified depressive episodes: Secondary | ICD-10-CM

## 2014-01-20 DIAGNOSIS — I6340 Cerebral infarction due to embolism of unspecified cerebral artery: Secondary | ICD-10-CM

## 2014-01-20 DIAGNOSIS — Z5189 Encounter for other specified aftercare: Secondary | ICD-10-CM

## 2014-01-20 DIAGNOSIS — F329 Major depressive disorder, single episode, unspecified: Secondary | ICD-10-CM

## 2014-01-20 NOTE — Progress Notes (Signed)
Occupational Therapy Session Note  Patient Details  Name: Calvin Merritt MRN: [id number] Date of Birth: November 28, 1940  Today's Date: 01/20/2014 OT Individual Time: 6295-2841 and 3244-0102 OT Individual Time Calculation (min): 63 min and 32 min   Short Term Goals: Week 2:  OT Short Term Goal 1 (Week 2): Pt will perform clothing management with alternating UE support (for safety) with steady A in standing  OT Short Term Goal 2 (Week 2): Pt will complete shower transfer with supervision with <25% cues for safety. OT Short Term Goal 3 (Week 2): Pt will complete toilet transfer with supervision with <25% cues for safety. OT Short Term Goal 4 (Week 2): Pt will visually attend to RUE during bathing task with min verbal cues to increase coordination.  Skilled Therapeutic Interventions/Progress Updates:  1) Received Pt in w/c dressed, Pt reporting assistance was received for LB dressing from PT.  Completed ADL grooming tasks at sink and UB dressing with set up assist.  Verbal cue provided to visually attend to Rt hand placement when rolling w/c due to previous Rt hand scrape on w/c and decreased awareness in sensation.  In therapy gym, Pt stood while participating in tabletop task focusing on [facility], coordination, and limited sensation in Rt hand, as well as increasing challenge of balance with reaching across midline.  Followed up with Pt on previous session topic regarding meal planning and laundry needs upon D/C, asking Pt if he had thought of how he could meet these needs, Pt's replied "no."  Pt stated he did not know how to use his mobile phone device to contact friends and family.  Educated Pt on use of phone to encourage utilizing it after D/C to maintain an open line of communication to address all needs.  Left Pt with [facility] shirt buttoning task to work on in room to increase productivity within ADL dressing task.   2)  Focused on squat pivot transfers from w/c <> toilet in hospital room and ADL apartment to  improve safety awareness and carryover of education within various environments.  Pt demonstrated task at supervision level for both, requiring verbal reeducation for w/c positioning next to toilet with transfer in ADL apartment.  Pt reported task does not worry him in regards to safety concerns prior to D/C.  Demonstrated transfer from w/c <> ADL apartment bed, sitting EOB <> supine with supervision to simulate Pt's layout at home.  Pt reports he has a hospital bed at home, but plans to use his regular bed with no rails to sleep in upon D/C.  Reeducated on appropriate donning of Rt ankle ASO device, as Pt continues to have difficulty donning due to decreased visual awareness of ASO positioning and limited Rt hand sensation and coordination.          Therapy Documentation Precautions:  Precautions Precautions: Fall Precaution Comments: Loop recorder Restrictions Weight Bearing Restrictions: No   ADL: ADL ADL Comments: see FIM Exercises:   Other Treatments:    See FIM for current functional status  Therapy/Group: Individual Therapy  Sharanya Templin 01/20/2014, 12:24 PM

## 2014-01-20 NOTE — Telephone Encounter (Signed)
ATC Dena back, Mailbox is full so unable to leave msg, [facility]

## 2014-01-20 NOTE — Progress Notes (Signed)
Social Work Patient ID: Calvin Merritt, male   DOB: 11-Dec-1940, 73 y.o.   MRN: [id number] Spoke with daughter-Dena who voiced several concerns regarding her dad going home.  Informed her the team also has concerns about him going home alone and our Recommendation is 24 hr care, but he will not have this, per him and family.  He does not want to hire assistance or go to a NH.  He has been to one in the past and plans never to return to one. She will try to check on him and see if one of her brother's can come in for education prior to pt leaving, since she feels she can not get him into his home, due to having to bump the wheelchair into front door. She reports her father tells them different things daily of his condition and they don't know what to believe.  Pt tends to create drama between them and himself.  Daughter reports this is not new he has done This all of her life.  She is unable to bring in the wheelchair since she can not get into pt's home, he changed the locks and one son has a key the one he had a fight with him last Sunday and son has not returned To the hospital.  Pt's is very stubborn and does what he wants to do, he is aware of the teams' recommnedation of 24 hr care and the concerns they have, regarding his high fall risk.  He is competent and able to make His own decisions, although he may not be using the best judgement.  Dena to call worker back regarding time her brother can come in for education either tomorrow or Sat.  Will try to set up all eligible  Services for pt as long as he will allow them into his house.

## 2014-01-20 NOTE — Progress Notes (Signed)
Social Work Patient ID: Calvin Merritt, male   DOB: 26-Feb-1941, 73 y.o.   MRN: [id number] Will order pt a wheelchair since will not see the one he has at home.  It is also 74 years old and he was bigger than. Waiting for daughter to call back regarding time for education.

## 2014-01-20 NOTE — Progress Notes (Signed)
Subjective/Complaints: 73 y.o. male with history of Hep C, familial spastic paraparesis with gait disorder, HTN, who was admitted on 12/31/13 with right sided weakness and slurred speech. Patient reported that he had difficulty turning the key or gripping the wheel in his truck for about 30 minutes then fell out of his truck while trying to get out and struck his head. EMS activated and CT head in ED negative for hemorrhage. MRI brain with patchy areas of acute infarction in the left MCA territory, predominantly involving the parietal lobe and small acute right parietal infarct. MRA brain with 4 mm abnormality along the posterior aspect of the proximal right PCA described on CTA remains indeterminate with question of partially thrombosed aneurysm. 2D echo with EF 55-60%, moderate AVS,with grade 2 diastolic dysfunction. Carotid dopplers without significant ICA stenosis.    Pt without new issues, no pain Discussed family training  Aware of no driving post d/c, he agrees he's not ready  Review of Systems - Negative except both legs weak, constipation   Objective: Vital Signs: Blood pressure 156/70, pulse 51, temperature 98.6 F (37 C), temperature source Oral, resp. rate 18, weight 180.7 kg (398 lb 5.9 oz), SpO2 97.00%. No results found. Results for orders placed during the hospital encounter of 01/05/14 (from the past 72 hour(s))  CREATININE, SERUM     Status: Abnormal   Collection Time    01/19/14  5:12 AM      Result Value Ref Range   Creatinine, Ser 1.37 (*) 0.50 - 1.35 mg/dL   GFR calc non Af Amer 50 (*) >90 mL/min   GFR calc Af Amer 57 (*) >90 mL/min   Comment: (NOTE)     The eGFR has been calculated using the CKD EPI equation.     This calculation has not been validated in all clinical situations.     eGFR's persistently <90 mL/min signify possible Chronic Kidney     Disease.     HEENT: normal Cardio: RRR and Gr 3/6 SEM Resp: CTA B/L and unlabored GI: BS positive and NT,  ND Extremity:  Pulses positive and No Edema Skin:   Intact Neuro: Alert/Oriented, Cranial Nerve II-XII normal, Abnormal Sensory reduced sensation in RUE and RLE to LT, astereognosis, Abnormal Motor 5/5 LUE, 4/5 RUE, 3/5 BLE and Tone:  increased bilateral LE extensor tone, MAS 2-3 Musc/Skel:  Other decreased ROM bilateral Hips, Knees, ankles Gen NAD   Assessment/Plan: 1. Functional deficits secondary to Right MCA infarct which require 3+ hours per day of interdisciplinary therapy in a comprehensive inpatient rehab setting. Physiatrist is providing close team supervision and 24 hour management of active medical problems listed below. Physiatrist and rehab team continue to assess barriers to discharge/monitor patient progress toward functional and medical goals.  FIM: FIM - Bathing Bathing Steps Patient Completed: Chest;Right Arm;Left Arm;Abdomen;Front perineal area;Buttocks;Right upper leg;Left upper leg Bathing: 4: Min-Patient completes 8-9 [id number] parts or 75+ percent  FIM - Upper Body Dressing/Undressing Upper body dressing/undressing steps patient completed: Thread/unthread right sleeve of pullover shirt/dresss;Thread/unthread left sleeve of pullover shirt/dress;Put head through opening of pull over shirt/dress;Pull shirt over trunk Upper body dressing/undressing: 5: Set-up assist to: Obtain clothing/put away FIM - Lower Body Dressing/Undressing Lower body dressing/undressing steps patient completed: Thread/unthread right underwear leg;Thread/unthread left underwear leg;Pull underwear up/down;Thread/unthread right pants leg;Thread/unthread left pants leg;Pull pants up/down;Fasten/unfasten pants;Don/Doff right sock;Don/Doff left sock;Don/Doff right shoe;Don/Doff left shoe;Fasten/unfasten right shoe;Fasten/unfasten left shoe Lower body dressing/undressing: 5: Set-up assist to: Don/Doff AFO/prosthesis/orthosis  FIM - Toileting  Toileting steps completed by patient: Adjust clothing after  toileting;Performs perineal hygiene;Adjust clothing prior to toileting Toileting Assistive Devices: Grab bar or rail for support Toileting: 4: Steadying assist  FIM - Radio producer Devices: Grab bars Toilet Transfers: 5-To toilet/BSC: Supervision (verbal cues/safety issues);5-From toilet/BSC: Supervision (verbal cues/safety issues)  FIM - Bed/Chair Transfer Bed/Chair Transfer Assistive Devices: Bed rails;Arm rests;Orthosis (R Foot Up brace) Bed/Chair Transfer: 5: Bed > Chair or W/C: Supervision (verbal cues/safety issues);5: Chair or W/C > Bed: Supervision (verbal cues/safety issues)  FIM - Locomotion: Wheelchair Distance: 180 Locomotion: Wheelchair: 5: Travels 150 ft or more: maneuvers on rugs and over door sills with supervision, cueing or coaxing FIM - Locomotion: Ambulation Locomotion: Ambulation Assistive Devices: Walker - Rolling;Other (comment) (walker weighted with 10 lbs) Ambulation/Gait Assistance: 5: Supervision;4: Min guard Locomotion: Ambulation: 5: Travels 150 ft or more with supervision/safety issues  Comprehension Comprehension Mode: Auditory Comprehension: 5-Follows basic conversation/direction: With no assist  Expression Expression Mode: Verbal Expression: 5-Expresses basic needs/ideas: With no assist  Social Interaction Social Interaction: 6-Interacts appropriately with others with medication or extra time (anti-anxiety, antidepressant).  Problem Solving Problem Solving: 5-Solves basic 90% of the time/requires cueing < 10% of the time  Memory Memory: 5-Recognizes or recalls 90% of the time/requires cueing < 10% of the time  Medical Problem List and Plan: 1. 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  DVT Prophylaxis/Anticoagulation: Pharmaceutical: Lovenox 3. Pain Management: Tylenol prn.   4. Depression/Mood: Willing to start  antidepressant. Willing to talk to psychologist about current impairments  as well as issues regarding ex-wife.   5. Neuropsych: This patient is capable of making decisions on his own behalf. 6. Skin/Wound Care: Routine pressure relief measures. Maintain adequate hydration and nutritional status.   7. Dyslipidemia: On lipitor 8.  Bowel colace prn 9.  HTN- was on lotensin, furosemide and amlodipine at home, meds held in acute care, BPs fluctuating  restarted amlodipine will increase to home dose, hold off on lotensin and furosemide (inc Creat),BPs overall improving 10.  Aortic stenosis, f/u cardiology as outpt, pt unaware of diagnosis discussed this today LOS (Days) [place] EVALUATION WAS Gowen E 01/20/2014, 7:55 AM

## 2014-01-20 NOTE — Progress Notes (Signed)
Social Work Patient ID: Calvin Merritt, male   DOB: 11/25/1940, 73 y.o.   MRN: [id number] Tried to contact daughter Chaya Jan without success, her voice mail box is full so could not leave a message. Unsure if family can come in tomorrow to do education.  Will keep trying.

## 2014-01-20 NOTE — Telephone Encounter (Signed)
Pt's daughter returned call. Holly D Pryor ° °

## 2014-01-20 NOTE — Telephone Encounter (Signed)
Pt had fasting labs at last OV and no mention of needing repeat fasting labs at follow-up. [id number] to advise.  Carron Curie, CMA

## 2014-01-20 NOTE — Progress Notes (Signed)
Physical Therapy Session Note  Patient Details  Name: Calvin Merritt MRN: [id number] Date of Birth: Aug 21, 1940  Today's Date: 01/20/2014 PT Individual Time: [phone] and [id number] PT Individual Time Calculation (min): 60 min and 33 min  Short Term Goals: Week 2:  PT Short Term Goal 1 (Week 2): Pt will consistently perform supine>sit with supervision with HOB flat, no rails. PT Short Term Goal 2 (Week 2): Pt will consistently perform sit>stand from w/c with supervision. PT Short Term Goal 3 (Week 2): Pt will perform gait x50' in controlled environment with min A using LRAD. PT Short Term Goal 4 (Week 2): Pt will manage all w/c parts without assistance/cueing.  Skilled Therapeutic Interventions/Progress Updates:    Treatment Session 1: Pt received semi reclined in bed; agreeable to therapy. Session focused on pt safely entering/exiting home, management/breakdown of w/c parts.  Seated EOB, assisted pt in donning shorts. Pt able to don R Foot Up brace with increased time and 50% cueing. Negotiated 6 stairs for LE strengthening with 2 rails, forward-facing with step-to pattern and supervision, 50% cueing for technique. In ortho gym, pt able to effectively verbalize all steps for bumping w/c up/down standard curb, threshold to enter home without cueing. Explained, demonstrated breakdown of w/c parts; pt effectively verbalized understanding. Performed gait x152' in controlled environment with rolling walker (weighted with 10 lbs) and close supervision at self-selected gait speed of .15 m/s, indicating increased falls risk. Gait characterized by limited bilat hip/knee extension, decreased bilat ankle dorsiflexion during swing of respective LE. Session ended in pt room, where pt was left seated in w/c with all needs within reach.  Treatment Session 2: Pt received seated in w/c; agreeable to therapy. Session focused on increasing pt independence with car transfers. Pt performed w/c mobility >200' in  controlled environment with bilat UE's and supervison, cueing for visual attention to RUE. In ortho gym, pt performed car transfers x2 with rolling walker (weighted with 10 lbs) and supervision, min cueing for hand placement (focus on UE support on immoveable objects) with effective within-session carryover during second transfer. Discussed DME; pt unsure as to whether personal w/c has anti-tippers. Pt in agreement with plan to order w/c as daughter did not bring in w/c, as expected. Spoke with CSW, Kriste Basque, concerning D/C plan, no family members present for education/training. Session ended in pt room, where pt was left seated in w/c with all needs within reach.   Therapy Documentation Precautions:  Precautions Precautions: Fall Precaution Comments: Loop recorder Restrictions Weight Bearing Restrictions: No Vital Signs: Therapy Vitals BP: 155/58 mmHg Pain:  Pt reported no pain during AM/PM PT sessions.  See FIM for current functional status  Therapy/Group: Individual Therapy  Keina Mutch, Lorenda Ishihara 01/20/2014, 10:48 AM

## 2014-01-21 ENCOUNTER — Encounter: Payer: Self-pay | Admitting: Internal Medicine

## 2014-01-21 ENCOUNTER — Inpatient Hospital Stay (HOSPITAL_COMMUNITY): Payer: Medicare Other | Admitting: Occupational Therapy

## 2014-01-21 ENCOUNTER — Inpatient Hospital Stay (HOSPITAL_COMMUNITY): Payer: Medicare Other | Admitting: Physical Therapy

## 2014-01-21 ENCOUNTER — Encounter (HOSPITAL_COMMUNITY): Payer: Medicare Other | Admitting: Occupational Therapy

## 2014-01-21 MED ORDER — ATORVASTATIN CALCIUM 10 MG PO TABS: 10.0000 mg | ORAL_TABLET | Freq: Every day | ORAL | Status: DC

## 2014-01-21 MED ORDER — PANTOPRAZOLE SODIUM 40 MG PO TBEC: 40.0000 mg | DELAYED_RELEASE_TABLET | Freq: Every day | ORAL | Status: DC

## 2014-01-21 MED ORDER — AMLODIPINE BESYLATE 10 MG PO TABS: 10.0000 mg | ORAL_TABLET | Freq: Every day | ORAL | Status: DC

## 2014-01-21 MED ORDER — BACLOFEN 10 MG PO TABS: 10.0000 mg | ORAL_TABLET | Freq: Three times a day (TID) | ORAL | Status: DC

## 2014-01-21 MED ORDER — ESCITALOPRAM OXALATE 5 MG PO TABS: 5.0000 mg | ORAL_TABLET | Freq: Every day | ORAL | Status: DC

## 2014-01-21 MED ORDER — ASPIRIN 325 MG PO TBEC: 325.0000 mg | DELAYED_RELEASE_TABLET | Freq: Every day | ORAL | Status: DC

## 2014-01-21 MED ORDER — ACETAMINOPHEN 325 MG PO TABS: 325.0000 mg | ORAL_TABLET | ORAL | Status: DC | PRN

## 2014-01-21 NOTE — Progress Notes (Signed)
Occupational Therapy Discharge Summary  Patient Details  Name: Calvin Merritt MRN: [id number] Date of Birth: 07/13/1940    Patient has met 10 of 12 long term goals due to improved balance, postural control, ability to compensate for deficits and improved awareness.  Patient to discharge at overall Supervision level.  Patient's care partner unavailable to provide the necessary physical and cognitive assistance at discharge.  Therapy team has recommended pt have 24/7 supervision due to impulsivity, decreased safety awareness and judgement, and decreased sensation/proprioception of RUE.  Treatment has been focused on squat pivot transfers and seated level for all self-care tasks due to unavailability of family/friends to provide recommended supervision to increase safety and decrease fall risk.  Reasons goals not met: Pt requires occasional min assist in standing with LB dressing due to impulsivity and decreased safety awareness.  Pt would benefit from supervision/setup and verbal cues to increase safety.  Pt with decreased recall of education and day to day carryover which impacts his safety with transfers and self-care tasks.    Recommendation:  Patient will benefit from ongoing skilled OT services in home health setting to continue to advance functional skills in the area of BADL and Reduce care partner burden.  Equipment: No equipment provided  Reasons for discharge: treatment goals met and discharge from hospital  Patient/family agrees with progress made and goals achieved: Yes  OT Discharge Precautions/Restrictions  Precautions Precautions: Fall Precaution Comments: Loop recorder, decreased safety awareness Required Braces or Orthoses: Other Brace/Splint Other Brace/Splint: R Foot Up brace for standing, gait Restrictions Weight Bearing Restrictions: No General   Vital Signs Therapy Vitals Temp: 98.6 F (37 C) Temp src: Oral Pulse Rate: 52 Resp: 17 BP: 137/61 mmHg Patient  Position (if appropriate): Sitting Oxygen Therapy SpO2: 97 % O2 Device: None (Room air) Pain Pain Assessment Pain Assessment: No/denies pain ADL ADL ADL Comments: see FIM Vision/Perception  Vision- History Baseline Vision/History: No visual deficits Patient Visual Report: No change from baseline Vision- Assessment Vision Assessment?: No apparent visual deficits  Cognition Overall Cognitive Status: Within Functional Limits for tasks assessed Arousal/Alertness: Awake/alert Orientation Level: Oriented X4 Behaviors: Impulsive Safety/Judgment: Impaired Sensation Sensation Light Touch: Impaired Detail Light Touch Impaired Details: Impaired RUE Stereognosis: Impaired Detail Stereognosis Impaired Details: Impaired RUE Coordination Gross Motor Movements are Fluid and Coordinated: No Fine Motor Movements are Fluid and Coordinated: No Finger Nose Finger Test: decreased control of right; does better when watching right UE 9 Hole Peg Test: Rt: 7:06 and Lt: 32 seconds Extremity/Trunk Assessment RUE Assessment RUE Assessment: Exceptions to WFL RUE AROM (degrees) Overall AROM Right Upper Extremity: Within functional limits for tasks performed RUE Strength RUE Overall Strength Comments: grossly 5/5 Grip (lbs): 39 Lateral Pinch: 14.5 lbs 3 Point Pinch: 11 lbs (unable to perform 3 pt due to decreased sensation, performed tip to tip) RUE Tone RUE Tone: Within Functional Limits LUE Assessment LUE Assessment: Within Functional Limits  See FIM for current functional status  HOXIE, SARAH 01/21/2014, 4:02 PM  

## 2014-01-21 NOTE — Progress Notes (Signed)
Social Work Patient ID: Calvin Merritt, male   DOB: 12-28-1940, 72 y.o.   MRN: [id number] Spoke with Dena-Daughter who reports she did not speak with either brother, but left a message with them to bring pt his house key.  This worker will attempt to contact two Son's to see if can arrange family education tomorrow.  Pt is aware of this and is in agreement with worker contacting son's.  He reports his sister can come if necessary. Unsure at this time if family education will be completed or not.

## 2014-01-21 NOTE — Progress Notes (Signed)
Subjective/Complaints: 73 y.o. male with history of Hep C, familial spastic paraparesis with gait disorder, HTN, who was admitted on 12/31/13 with right sided weakness and slurred speech. Patient reported that he had difficulty turning the key or gripping the wheel in his truck for about 30 minutes then fell out of his truck while trying to get out and struck his head. EMS activated and CT head in ED negative for hemorrhage. MRI brain with patchy areas of acute infarction in the left MCA territory, predominantly involving the parietal lobe and small acute right parietal infarct. MRA brain with 4 mm abnormality along the posterior aspect of the proximal right PCA described on CTA remains indeterminate with question of partially thrombosed aneurysm. 2D echo with EF 55-60%, moderate AVS,with grade 2 diastolic dysfunction. Carotid dopplers without significant ICA stenosis.    Daughter not available for training, sister and son coming in per pt  Review of Systems - Negative except both legs weak, constipation   Objective: Vital Signs: Blood pressure 139/66, pulse 51, temperature 98.5 F (36.9 C), temperature source Oral, resp. rate 18, weight 180.7 kg (398 lb 5.9 oz), SpO2 95.00%. No results found. Results for orders placed during the hospital encounter of 01/05/14 (from the past 72 hour(s))  CREATININE, SERUM     Status: Abnormal   Collection Time    01/19/14  5:12 AM      Result Value Ref Range   Creatinine, Ser 1.37 (*) 0.50 - 1.35 mg/dL   GFR calc non Af Amer 50 (*) >90 mL/min   GFR calc Af Amer 57 (*) >90 mL/min   Comment: (NOTE)     The eGFR has been calculated using the CKD EPI equation.     This calculation has not been validated in all clinical situations.     eGFR's persistently <90 mL/min signify possible Chronic Kidney     Disease.     HEENT: normal Cardio: RRR and Gr 3/6 SEM Resp: CTA B/L and unlabored GI: BS positive and NT, ND Extremity:  Pulses positive and No  Edema Skin:   Intact Neuro: Alert/Oriented, Cranial Nerve II-XII normal, Abnormal Sensory reduced sensation in RUE and RLE to LT, astereognosis, Abnormal Motor 5/5 LUE, 4/5 RUE, 3/5 BLE and Tone:  increased bilateral LE extensor tone, MAS 2-3 Musc/Skel:  Other decreased ROM bilateral Hips, Knees, ankles Gen NAD   Assessment/Plan: 1. Functional deficits secondary to Right MCA infarct which require 3+ hours per day of interdisciplinary therapy in a comprehensive inpatient rehab setting. Physiatrist is providing close team supervision and 24 hour management of active medical problems listed below. Physiatrist and rehab team continue to assess barriers to discharge/monitor patient progress toward functional and medical goals. Should be medically ready for D/C in am, at sup level, pt states son can be with him all weekend, unclear who caregiver will be after then FIM: FIM - Bathing Bathing Steps Patient Completed: Chest;Right Arm;Left Arm;Abdomen;Front perineal area;Buttocks;Right upper leg;Left upper leg Bathing: 4: Min-Patient completes 8-9 79f 10 parts or 75+ percent  FIM - Upper Body Dressing/Undressing Upper body dressing/undressing steps patient completed: Thread/unthread right sleeve of pullover shirt/dresss;Thread/unthread left sleeve of pullover shirt/dress;Put head through opening of pull over shirt/dress;Pull shirt over trunk Upper body dressing/undressing: 5: Set-up assist to: Obtain clothing/put away FIM - Lower Body Dressing/Undressing Lower body dressing/undressing steps patient completed: Thread/unthread right underwear leg;Thread/unthread left underwear leg;Pull underwear up/down;Thread/unthread right pants leg;Thread/unthread left pants leg;Pull pants up/down;Fasten/unfasten pants;Don/Doff right sock;Don/Doff left sock;Don/Doff right shoe;Don/Doff left shoe;Fasten/unfasten  right shoe;Fasten/unfasten left shoe Lower body dressing/undressing: 5: Set-up assist to: Don/Doff  AFO/prosthesis/orthosis  FIM - Toileting Toileting steps completed by patient: Adjust clothing after toileting;Performs perineal hygiene;Adjust clothing prior to toileting Toileting Assistive Devices: Grab bar or rail for support Toileting: 4: Steadying assist  FIM - Radio producer Devices: Grab bars Toilet Transfers: 5-To toilet/BSC: Supervision (verbal cues/safety issues);5-From toilet/BSC: Supervision (verbal cues/safety issues)  FIM - Engineer, site Assistive Devices: Arm rests Bed/Chair Transfer: 5: Supine > Sit: Supervision (verbal cues/safety issues);5: Sit > Supine: Supervision (verbal cues/safety issues);5: Bed > Chair or W/C: Supervision (verbal cues/safety issues);5: Chair or W/C > Bed: Supervision (verbal cues/safety issues)  FIM - Locomotion: Wheelchair Distance: 200 Locomotion: Wheelchair: 5: Travels 150 ft or more: maneuvers on rugs and over door sills with supervision, cueing or coaxing FIM - Locomotion: Ambulation Locomotion: Ambulation Assistive Devices: Walker - Rolling;Other (comment) (walker weighted with 10 lbs; R Foot Up brace) Ambulation/Gait Assistance: 5: Supervision (close supervision) Locomotion: Ambulation: 5: Travels 150 ft or more with supervision/safety issues  Comprehension Comprehension Mode: Auditory Comprehension: 5-Follows basic conversation/direction: With no assist  Expression Expression Mode: Verbal Expression: 5-Expresses basic needs/ideas: With no assist  Social Interaction Social Interaction: 6-Interacts appropriately with others with medication or extra time (anti-anxiety, antidepressant).  Problem Solving Problem Solving: 5-Solves basic 90% of the time/requires cueing < 10% of the time  Memory Memory: 5-Recognizes or recalls 90% of the time/requires cueing < 10% of the time  Medical Problem List and Plan: 1. 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  DVT Prophylaxis/Anticoagulation: Pharmaceutical: Lovenox 3. Pain Management: Tylenol prn.   4. Depression/Mood: Willing to start antidepressant. Willing to talk to psychologist about current impairments  as well as issues regarding ex-wife.   5. Neuropsych: This patient is capable of making decisions on his own behalf. 6. Skin/Wound Care: Routine pressure relief measures. Maintain adequate hydration and nutritional status.   7. Dyslipidemia: On lipitor 8.  Bowel colace prn 9.  HTN- was on lotensin, furosemide and amlodipine at home, meds held in acute care, BPs fluctuating  restarted amlodipine will increase to home dose, hold off on lotensin and furosemide (inc Creat),BPs overall improving 10.  Aortic stenosis, f/u cardiology as outpt,  LOS (Days) [place] EVALUATION WAS PERFORMED  KIRSTEINS,ANDREW E 01/21/2014, 8:14 AM

## 2014-01-21 NOTE — Plan of Care (Signed)
Problem: RH Simple Meal Prep Goal: LTG Patient will perform simple meal prep w/assist (OT) LTG: Patient will perform simple meal prep with assistance, with/without cues (OT).  Outcome: Not Applicable Date Met:  01/21/00 On 01/19/2014 pt reports that he does not cook and will not complete any simple meal prep but plans to have neighbors and family provide meals.

## 2014-01-21 NOTE — Telephone Encounter (Signed)
Called spoke with Dena. Made her aware of below. Nothing further needed

## 2014-01-21 NOTE — Progress Notes (Signed)
Physical Therapy Discharge Summary  Patient Details  Name: Calvin Merritt MRN: [id number] Date of Birth: 1940/05/31  Today's Date: 01/21/2014 PT Individual Time: 1004-1104 PT Individual Time Calculation (min): 60 min    Patient has met 10 of 11 long term goals due to improved activity tolerance, improved balance, improved postural control, increased strength, decreased pain, ability to compensate for deficits, functional use of  right upper extremity and right lower extremity, improved awareness, improved coordination and decreased LE tone.  Patient to discharge at a wheelchair level Supervision.   No caregiver available to provide necessary assistance/supervision at discharge. On multiple instances, pt has verbalized understanding of recommendations (made by therapists and social worker) for patient to have 24/7 supervision and perform all mobility and self-care at a wheelchair level.  Reasons goals not met: Long term goal addressing car transfers not consistently met, as pt requires steadying assist at times.  Recommendation:  Patient will benefit from ongoing skilled PT services in home health setting to continue to advance safe functional mobility, address ongoing impairments in safety and independence with functional mobility and minimize fall risk.  Equipment: wheelchair (16"x18") and cushion, rolling walker, R Foot Up Brace, and leather toe caps on bilateral shoes  Reasons for discharge: treatment goals met and discharge from hospital  Patient/family agrees with progress made and goals achieved: Yes  Skilled Therapeutic Interventions/Progress Updates: Pt received seated in w/c; agreeable to therapy. Session focused on assessing/addressing safety and independence with functional mobility. See D/C evaluation below for details. Pt performed w/c mobility x250' in controlled, home, and community environments with bilat UE's and supervision, subtle to min cueing for visual attention to R hand  on w/c hand rim. Pt performed gait x160' in controlled environment with rolling walker (weighted with 10 lbs) wearing R Foot Up brace for dorsiflexion assist requiring supervision; no overt LOB. In ortho gym, pt able to verbalize 80% of steps involved in bumping w/c up down standard curb in w/c to enable to pt to safely enter/exit home. Min cueing for management of anti-tippers. Paper handout provided for reinforcement.  Pt negotiated 12 stairs with bilat rails for LE strength requriing supervision, cueing for sequencing/technique. Modified pt's personal w/c by adding Theraband to R w/c hand rim to increase visual attention to, kinesthetic awareness of RUE position on w/c hand rim. Performed car transfer with rolling walker (weighted with 10 lbs) x2 trials; initial trial requiring steadying assist, min cueing for hand placement. Second trial with supervision, effective return demonstration of safe hand placement. Performed floor transfer with supervision.  Verbally reiterated recommendation for all functional mobility and elf-care to be performed at w/c level. Recommended squat pivot transfers and 24/7 supervision (including supervision with all functional mobility). Verbally recommended that weighted rolling walker be utilized for car transfer only (secondary to height of pt's truck) and that all other functional transfers are squat pivot secondary to fall risk with standing/gait. Educated pt that ambulation on rehab unit was performed to address functional balance and LE spasticity. Pt verbalized understanding of recommendations but continues to report plan to have no supervision at home. Session ended in pt room, where pt was left seated in w/c with all needs within reach.  PT Discharge Precautions/Restrictions Precautions Precautions: Fall Precaution Comments: Loop recorder, decreased safety awareness Required Braces or Orthoses: Other Brace/Splint Other Brace/Splint: R Foot Up brace for standing,  gait Restrictions Weight Bearing Restrictions: No Vital Signs Therapy Vitals BP: 139/66 mmHg Pain Pain Assessment Pain Assessment: No/denies pain Pain Score: 0-No  pain Vision/Perception     Cognition Overall Cognitive Status: Within Functional Limits for tasks assessed Arousal/Alertness: Awake/alert Orientation Level: Oriented X4 Behaviors: Impulsive Safety/Judgment: Impaired Sensation Sensation Light Touch: Impaired Detail Light Touch Impaired Details: Impaired RUE;Impaired RLE Stereognosis: Impaired Detail Stereognosis Impaired Details: Impaired RUE Proprioception: Impaired Detail Proprioception Impaired Details: Impaired RLE Coordination Gross Motor Movements are Fluid and Coordinated: No Fine Motor Movements are Fluid and Coordinated: No Finger Nose Finger Test: decreased control of right; does better when watching right UE Heel Shin Test: Decreased control of RLE as compared with LLE. 9 Hole Peg Test: Rt: 7:06 and Lt: 32 seconds Motor  Motor Motor: Abnormal tone Motor - Discharge Observations: Abnormal tone in R hamstrings (improved since evaluation); impaired coordination/perception in RUE/RLE   Mobility Bed Mobility Bed Mobility: Supine to Sit;Sit to Supine;Scooting to HOB Supine to Sit: 6: Modified independent (Device/Increase time);HOB flat Sit to Supine: 6: Modified independent (Device/Increase time);HOB flat Scooting to HOB: 6: Modified independent (Device/Increase time) Transfers Transfers: Yes Sit to Stand: 4: Min guard;5: Supervision;From chair/3-in-1;From bed;Other (comment) (with rolling walker) Sit to Stand Details: Tactile cues for weight shifting;Verbal cues for precautions/safety Stand to Sit: 5: Supervision;To chair/3-in-1;To bed;4: Min guard Stand to Sit Details (indicate cue type and reason): Verbal cues for precautions/safety Stand Pivot Transfers: Other (comment);5: Supervision;4: Min guard (using rolling walker) Stand Pivot Transfer Details:  Tactile cues for weight shifting;Verbal cues for precautions/safety Squat Pivot Transfers: 5: Supervision Squat Pivot Transfer Details: Verbal cues for precautions/safety Squat Pivot Transfer Details (indicate cue type and reason): Cueing for safe setup (position of w/c, fully locking w/c brakes). Locomotion  Ambulation Ambulation: Yes Ambulation/Gait Assistance: 5: Supervision Ambulation Distance (Feet): 160 Feet Assistive device: Rolling walker;Other (Comment) (rolling walker weighted with 10 lbs to control advancement; R Foot Up Brace for dorsiflexion asssit; bilat toe caps) Ambulation/Gait Assistance Details: Tactile cues for posture Gait Gait: Yes Gait Pattern: Impaired Gait Pattern: Trunk flexed;Narrow base of support;Right flexed knee in stance;Left flexed knee in stance;Step-through pattern;Decreased dorsiflexion - left;Decreased dorsiflexion - right Gait velocity: self-selected gait speed =.15 m/s Stairs / Additional Locomotion Stairs: Yes Stairs Assistance: 5: Supervision Stairs Assistance Details: Verbal cues for technique;Verbal cues for sequencing Stair Management Technique: Two rails;Step to pattern Number of Stairs: 12 Height of Stairs: 6.5 Ramp: 5: Supervision Curb: 1: +1 Total assist Wheelchair Mobility Wheelchair Mobility: Yes Wheelchair Assistance: 4: Advertising account executive Details: Verbal cues for technique;Other (comment) (for visual attention to R hand on w/c hand rim) Wheelchair Propulsion: Both upper extremities Wheelchair Parts Management: Supervision/cueing Distance: 250  Trunk/Postural Assessment  Cervical Assessment Cervical Assessment: Within Functional Limits Thoracic Assessment Thoracic Assessment: Exceptions to [facility] (thoracic kyphosis) Lumbar Assessment Lumbar Assessment: Exceptions to [facility] (posterior pelvic tilt) Postural Control Postural Control: Deficits on evaluation Righting Reactions: Delayed, inconsistently effective ankle  strategy bilaterally with A/P balance perturbations; no stepping strategy present.  Balance Dynamic Standing Balance Dynamic Standing - Balance Support: Left upper extremity supported Dynamic Standing - Level of Assistance: 5: Stand by assistance Extremity Assessment  RUE Assessment RUE Assessment: Exceptions to [facility] RUE AROM (degrees) Overall AROM Right Upper Extremity: Within functional limits for tasks performed RUE Strength RUE Overall Strength Comments: grossly 5/5 Grip (lbs): 39 Lateral Pinch: 14.5 lbs 3 Point Pinch: 11 lbs (unable to perform 3 pt due to decreased sensation, performed tip to tip) RUE Tone RUE Tone: Within Functional Limits LUE Assessment LUE Assessment: Within Functional Limits RLE Assessment RLE Assessment: Exceptions to [facility] RLE Strength RLE Overall Strength: Deficits  Right Hip Flexion: 4/5 Right Knee Flexion: 5/5 Right Knee Extension: 5/5 Right Ankle Dorsiflexion: 3-/5 Right Ankle Plantar Flexion: 4/5 RLE Tone RLE Tone: Modified Ashworth Modified Ashworth Scale for Grading Hypertonia RLE: Slight increase in muscle tone, manifested by a catch, followed by minimal resistance throughout the remainder (less than half) of the ROM RLE Tone Comments: R hamstrings LLE Assessment LLE Assessment: Within Functional Limits  See FIM for current functional status  Zaia Carre, Malva Cogan 01/21/2014, 7:46 PM

## 2014-01-21 NOTE — Plan of Care (Signed)
Problem: RH Car Transfers Goal: LTG Patient will perform car transfers with assist (PT) LTG: Patient will perform car transfers with assistance (PT).  Outcome: Not Met (add Reason) Pt required steadying assist at times.

## 2014-01-21 NOTE — Progress Notes (Signed)
Occupational Therapy Session Note  Patient Details  Name: Calvin Merritt MRN: [id number] Date of Birth: Oct 14, 1940  Today's Date: 01/21/2014 OT Individual Time:  0730- 0834 and 1400-1504  OT Individual Time Calculation (min): 64 min and 64 min   Short Term Goals: Week 2:  OT Short Term Goal 1 (Week 2): Pt will perform clothing management with alternating UE support (for safety) with steady A in standing  OT Short Term Goal 2 (Week 2): Pt will complete shower transfer with supervision with <25% cues for safety. OT Short Term Goal 3 (Week 2): Pt will complete toilet transfer with supervision with <25% cues for safety. OT Short Term Goal 4 (Week 2): Pt will visually attend to RUE during bathing task with min verbal cues to increase coordination.  Skilled Therapeutic Interventions/Progress Updates:    1) Pt demonstrated all ADL tasks, bathing in shower, grooming, and dressing, during session.  Pt problem solved with set up and obtained all objects prior to tasks without verbal cues.  Squat pivot transfers from w/c <> shower chair and with sit <> stand to wash buttocks while in shower at supervision level.  Pt began shower water before realizing underwear and shorts were still on and addressed issue with turning off water prior to standing to doff both clothing items without cuing provided.  Pt unaware of dropped wash cloths x2 and continued to wash buttocks with hand due to decreased sensation in hands.  Verbal cue provided to increase safety awareness with positioning of w/c foot rests when Pt's Lt foot was pinched between both foot rests due to decreased sensation.  Reeducated Pt on w/c safety awareness pertaining to foot rests and encouraged double checking for locked brakes.  Pt with multiple sit <> stand for LB bathing and dressing without physical assist, one instance pt required Min A with stand > sit to w/c while at sink due to impulsivity and decreased attention to balance during dressing task.   Pt continues to display difficulty with correctly donning Rt ankle ASO device.    Continue to recommend 24/7 supervision upon D/C due to pt's impulsivity and decreased safety awareness.  Pt reports understanding but states that he will most likely be alone.    2) Upon arrival Pt had received a new w/c and discussed areas of concern while using new equipment pertaining to appropriately locking arm rests and foot rests.  W/c cushion also appears to slide out of intended position during transfers, dysum was added under cushion to prevent sliding and to decrease safety risks.  Addressed Pt's questions, and educated regarding safety awareness issues.  Within therapy gym Pt completed 9-hole peg test using both hands, a significant increase in time was needed when using Rt hand due to decreased FMC, sensation, and coordination.  Encouraged Pt to continue using built up handles on utensils with self-feeding task at home to improve grasp.  Please refer to D/C  summary for 9-hole peg test and MMT results.  Reiterated the importance of having someone at home to assist with Pt's needs at all times serving as a safety measure.         Therapy Documentation Precautions:  Precautions Precautions: Fall Precaution Comments: Loop recorder, decreased safety awareness Required Braces or Orthoses: Other Brace/Splint Other Brace/Splint: R Foot Up brace for standing, gait Restrictions Weight Bearing Restrictions: No    Vital Signs: Therapy Vitals Temp: 98.6 F (37 C) Temp src: Oral Pulse Rate: 52 Resp: 17 BP: 137/61 mmHg Patient Position (if appropriate):  Sitting Oxygen Therapy SpO2: 97 % O2 Device: None (Room air) Pain:    No c/o pain.  ADL: ADL ADL Comments: see FIM  See FIM for current functional status  Therapy/Group: Individual Therapy  Garyn Waguespack 01/21/2014, 3:27 PM

## 2014-01-21 NOTE — Progress Notes (Signed)
Social Work Discharge Note Discharge Note  The overall goal for the admission was met for:   Discharge location: Yes-HOME WITH INTERMITTENT ASSIST  Length of Stay: Yes-18 DAYS  Discharge activity level: Yes-SUPERVISION W/C LEVEL AND MIN ASSIST AMBULATION  Home/community participation: Yes  Services provided included: MD, RD, PT, OT, SLP, RN, CM, TR, Pharmacy, Neuropsych and SW  Financial Services: Private Insurance: [facility]  Follow-up services arranged: Home Health: [facility]-PT,OT,RN,SW, DME: [place] and Patient/Family has no preference for HH/DME agencies  Comments (or additional information):NO FAMILY EDUCATION COMPLETED DUE TO COULD NOT GET [place]' CONCERNS REGARDING NEEDING 24 HR CARE. HE IS COMPETENT AND ABLE TO MAKE HIS OWN DECISIONS.  HE REFUSED HIRING ASSIST AND NHP.  HE IS SUPPOSE TO STAY IN THE WHEELCHAIR AND NOT AMBULATE UNLESS SOMEONE IS WITH HIM. HOPEFULLY CHILDREN WILL MAKE SURE HE HAS FOOD AND CHECK ON HIM.  HE IS AWARE NO DRIVING. HOPE FOR THE BEST  Patient/Family verbalized understanding of follow-up arrangements: Yes  Individual responsible for coordination of the follow-up plan: SELF  Confirmed correct DME delivered: Elease Hashimoto 01/21/2014    Elease Hashimoto

## 2014-01-21 NOTE — Progress Notes (Signed)
Social Work Patient ID: Calvin Merritt, male   DOB: 1941-01-12, 73 y.o.   MRN: [id number] Left messages for both son's will no return call.  Pt aware of this and hopes one of them comes to take him home tomorrow.  Pt aware of the team's concerns and plans to go home anyway.  Plan for discharge tomorrow.

## 2014-01-21 NOTE — Progress Notes (Signed)
I have reviewed and agree with the attached treatment note.  Husna Krone, OTR/L 

## 2014-01-21 NOTE — Progress Notes (Signed)
I have reviewed and agree with the attached treatment note.  Liviah Cake, OTR/L 

## 2014-01-21 NOTE — Discharge Instructions (Signed)
Inpatient Rehab Discharge Instructions  SANG BLOUNT Discharge date and time: 01/22/14   Activities/Precautions/ Functional Status: Activity: activity as tolerated. NO DRIVING Diet: low fat, low cholesterol diet Wound Care: none needed Functional status:  ___ No restrictions     ___ Walk up steps independently _X__ 24/7 supervision/assistance   ___ Walk up steps with assistance ___ Intermittent supervision/assistance  ___ Bathe/dress independently ___ Walk with walker     ___ Bathe/dress with assistance ___ Walk Independently    ___ Shower independently _X__ Walk with assistance    ___ Shower with assistance _X__ No alcohol     ___ Return to work/school ________  Special Instructions:   COMMUNITY REFERRALS UPON DISCHARGE:    Home Health:   PT, OT, RN, SW  Agency:[facility] Phone:[phone] Date of last service: 01/22/2014  Medical Equipment/Items Ordered:WHEELCHAIR & Levan Hurst  Agency/Supplier:[facility]    [phone] Other: PRIVATE DUTY LIST  GENERAL COMMUNITY RESOURCES FOR PATIENT/FAMILY: Support Groups:[facility] SUPPORT GROUP   STROKE/TIA DISCHARGE INSTRUCTIONS SMOKING Cigarette smoking nearly doubles your risk of having a stroke & is the single most alterable risk factor  If you smoke or have smoked in the last 12 months, you are advised to quit smoking for your health.  Most of the excess cardiovascular risk related to smoking disappears within a year of stopping.  Ask you doctor about anti-smoking medications  [place] Quit Line: 1-800-QUIT NOW  Free Smoking Cessation Classes [phone]  CHOLESTEROL Know your levels; limit fat & cholesterol in your diet  Lipid Panel     Component Value Date/Time   CHOL 173 01/01/2014 0250   TRIG 163* 01/01/2014 0250   HDL 33* 01/01/2014 0250   CHOLHDL 5.2 01/01/2014 0250   VLDL 33 01/01/2014 0250   LDLCALC 107* 01/01/2014 0250      Many patients benefit from treatment even if their cholesterol is at goal.  Goal: Total  Cholesterol (CHOL) less than 160  Goal:  Triglycerides (TRIG) less than 150  Goal:  HDL greater than 40  Goal:  LDL (LDLCALC) less than 100   BLOOD PRESSURE American Stroke Association blood pressure target is less that 120/80 mm/Hg  Your discharge blood pressure is:  BP: 139/66 mmHg  Monitor your blood pressure  Limit your salt and alcohol intake  Many individuals will require more than one medication for high blood pressure  DIABETES (A1c is a blood sugar average for last 3 months) Goal HGBA1c is under 7% (HBGA1c is blood sugar average for last 3 months)  Diabetes: No known diagnosis of diabetes    Lab Results  Component Value Date   HGBA1C 6.4* 01/01/2014     Your HGBA1c can be lowered with medications, healthy diet, and exercise.  Check your blood sugar as directed by your physician  Call your physician if you experience unexplained or low blood sugars.  PHYSICAL ACTIVITY/REHABILITATION Goal is 30 minutes at least 4 days per week  Activity: No driving, Therapies: See above Return to work: to be decided on follow up.   Activity decreases your risk of heart attack and stroke and makes your heart stronger.  It helps control your weight and blood pressure; helps you relax and can improve your mood.  Participate in a regular exercise program.  Talk with your doctor about the best form of exercise for you (dancing, walking, swimming, cycling).  DIET/WEIGHT Goal is to maintain a healthy weight  Your discharge diet is: Cardiac thin  liquids Your height is:  6'1" Your current weight is: Weight: 180.7 kg (398 lb 5.9 oz) Your Body Mass Index (BMI) is:    Following the type of diet specifically designed for you will help prevent another stroke.  Your goal weight: 189 lbs  Your goal Body Mass Index (BMI) is 19-24.  Healthy food habits can help reduce 3 risk factors for stroke:  High cholesterol, hypertension, and excess weight.  RESOURCES Stroke/Support Group:  Call  [phone]   STROKE EDUCATION PROVIDED/REVIEWED AND GIVEN TO PATIENT Stroke warning signs and symptoms How to activate emergency medical system (call 911). Medications prescribed at discharge. Need for follow-up after discharge. Personal risk factors for stroke. Pneumonia vaccine given:  Flu vaccine given:  My questions have been answered, the writing is legible, and I understand these instructions.  I will adhere to these goals & educational materials that have been provided to me after my discharge from the hospital.      My questions have been answered and I understand these instructions. I will adhere to these goals and the provided educational materials after my discharge from the hospital.  Patient/Caregiver Signature _______________________________ Date __________  Clinician Signature _______________________________________ Date __________  Please bring this form and your medication list with you to all your follow-up doctor's appointments.

## 2014-01-22 DIAGNOSIS — I6340 Cerebral infarction due to embolism of unspecified cerebral artery: Secondary | ICD-10-CM

## 2014-01-22 DIAGNOSIS — F329 Major depressive disorder, single episode, unspecified: Secondary | ICD-10-CM

## 2014-01-22 DIAGNOSIS — F3289 Other specified depressive episodes: Secondary | ICD-10-CM

## 2014-01-22 DIAGNOSIS — Z5189 Encounter for other specified aftercare: Secondary | ICD-10-CM

## 2014-01-22 NOTE — Progress Notes (Signed)
Subjective/Complaints: 73 y.o. male with history of Hep C, familial spastic paraparesis with gait disorder, HTN, who was admitted on 12/31/13 with right sided weakness and slurred speech. Patient reported that he had difficulty turning the key or gripping the wheel in his truck for about 30 minutes then fell out of his truck while trying to get out and struck his head. EMS activated and CT head in ED negative for hemorrhage. MRI brain with patchy areas of acute infarction in the left MCA territory, predominantly involving the parietal lobe and small acute right parietal infarct. MRA brain with 4 mm abnormality along the posterior aspect of the proximal right PCA described on CTA remains indeterminate with question of partially thrombosed aneurysm. 2D echo with EF 55-60%, moderate AVS,with grade 2 diastolic dysfunction. Carotid dopplers without significant ICA stenosis.    No new issues. Going home today  Review of Systems -    Objective: Vital Signs: Blood pressure 147/69, pulse 51, temperature 98.4 F (36.9 C), temperature source Oral, resp. rate 17, weight 81.965 kg (180 lb 11.2 oz), SpO2 93.00%. No results found. No results found for this or any previous visit (from the past 72 hour(s)).   HEENT: normal Cardio: RRR and Gr 3/6 SEM Resp: CTA B/L and unlabored GI: BS positive and NT, ND Extremity:  Pulses positive and No Edema Skin:   Intact Neuro: Alert/Oriented, Cranial Nerve II-XII normal, Abnormal Sensory reduced sensation in RUE and RLE to LT, astereognosis, Abnormal Motor 5/5 LUE, 4/5 RUE, 3/5 BLE and Tone:  increased bilateral LE extensor tone, MAS 2-3 Musc/Skel:  Other decreased ROM bilateral Hips, Knees, ankles Gen NAD   Assessment/Plan: 1. Functional deficits secondary to Right MCA infarct which require 3+ hours per day of interdisciplinary therapy in a comprehensive inpatient rehab setting. Physiatrist is providing close team supervision and 24 hour management of active  medical problems listed below. Physiatrist and rehab team continue to assess barriers to discharge/monitor patient progress toward functional and medical goals. Home today   FIM: FIM - Bathing Bathing Steps Patient Completed: Chest;Right Arm;Left Arm;Abdomen;Front perineal area;Buttocks;Right upper leg;Left upper leg;Right lower leg (including foot);Left lower leg (including foot) Bathing: 5: Supervision: Safety issues/verbal cues  FIM - Upper Body Dressing/Undressing Upper body dressing/undressing steps patient completed: Thread/unthread right sleeve of pullover shirt/dresss;Thread/unthread left sleeve of pullover shirt/dress;Put head through opening of pull over shirt/dress;Pull shirt over trunk Upper body dressing/undressing: 6: More than reasonable amount of time FIM - Lower Body Dressing/Undressing Lower body dressing/undressing steps patient completed: Thread/unthread right underwear leg;Thread/unthread left underwear leg;Pull underwear up/down;Thread/unthread right pants leg;Thread/unthread left pants leg;Pull pants up/down;Fasten/unfasten pants;Don/Doff right sock;Don/Doff left sock;Don/Doff right shoe;Don/Doff left shoe;Fasten/unfasten right shoe;Fasten/unfasten left shoe Lower body dressing/undressing: 4: Steadying Assist  FIM - Toileting Toileting steps completed by patient: Adjust clothing after toileting;Performs perineal hygiene;Adjust clothing prior to toileting Toileting Assistive Devices: Grab bar or rail for support Toileting: 4: Steadying assist  FIM - Diplomatic Services operational officer Devices: Grab bars Toilet Transfers: 5-To toilet/BSC: Supervision (verbal cues/safety issues);5-From toilet/BSC: Supervision (verbal cues/safety issues)  FIM - Press photographer Assistive Devices: Arm rests Bed/Chair Transfer: 6: Sit > Supine: No assist;5: Bed > Chair or W/C: Supervision (verbal cues/safety issues);6: Supine > Sit: No assist;6: More than  reasonable amt of time;5: Chair or W/C > Bed: Supervision (verbal cues/safety issues)  FIM - Locomotion: Wheelchair Distance: 250 Locomotion: Wheelchair: 5: Travels 150 ft or more: maneuvers on rugs and over door sills with supervision, cueing or coaxing FIM -  Locomotion: Ambulation Locomotion: Ambulation Assistive Devices: Walker - Rolling;Other (comment) (walker weighted with 10 lbs; R Foot Up brace) Ambulation/Gait Assistance: 5: Supervision Locomotion: Ambulation: 5: Travels 150 ft or more with supervision/safety issues  Comprehension Comprehension Mode: Auditory Comprehension: 5-Follows basic conversation/direction: With no assist  Expression Expression Mode: Verbal Expression: 5-Expresses basic needs/ideas: With no assist  Social Interaction Social Interaction: 6-Interacts appropriately with others with medication or extra time (anti-anxiety, antidepressant).  Problem Solving Problem Solving: 5-Solves complex 90% of the time/cues < 10% of the time  Memory Memory: 5-Recognizes or recalls 90% of the time/requires cueing < 10% of the time  Medical Problem List and Plan: 1. 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  DVT Prophylaxis/Anticoagulation: Pharmaceutical: Lovenox 3. Pain Management: Tylenol prn.   4. Depression/Mood: Willing to start antidepressant. Willing to talk to psychologist about current impairments  as well as issues regarding ex-wife.   5. Neuropsych: This patient is capable of making decisions on his own behalf. 6. Skin/Wound Care: Routine pressure relief measures. Maintain adequate hydration and nutritional status.   7. Dyslipidemia: On lipitor 8.  Bowel colace prn 9.  HTN- was on lotensin, furosemide and amlodipine at home, meds held in acute care, BPs fluctuating  restarted amlodipine will increase to home dose, hold off on lotensin and furosemide (inc  Creat),BPs overall improving 10.  Aortic stenosis, f/u cardiology as outpt,  LOS (Days) 17 A FACE TO FACE EVALUATION WAS PERFORMED  Royalty Domagala T 01/22/2014, 9:37 AM

## 2014-01-22 NOTE — Progress Notes (Signed)
0955 Pt. Discharged to home with family.  All discharge instructions provided on 9/4 by Marissa Nestle, PA.  No further questions asked.  All personal items in tow.  Escorted to vehicle in [facility] by NT, Hawa.

## 2014-01-26 ENCOUNTER — Encounter: Payer: Self-pay | Admitting: *Deleted

## 2014-01-26 ENCOUNTER — Encounter: Payer: Medicare Other | Admitting: Internal Medicine

## 2014-01-27 ENCOUNTER — Encounter: Payer: Self-pay | Admitting: *Deleted

## 2014-01-27 ENCOUNTER — Telehealth: Payer: Self-pay | Admitting: Internal Medicine

## 2014-01-27 ENCOUNTER — Telehealth: Payer: Self-pay | Admitting: *Deleted

## 2014-01-27 NOTE — Telephone Encounter (Signed)
Please advise MW if okay to do so? thanks

## 2014-01-27 NOTE — Telephone Encounter (Signed)
Called [facility]. Gave VO. Nothing further needed

## 2014-01-27 NOTE — Telephone Encounter (Signed)
Fine with me - can send out case manager for home needs assesament

## 2014-01-27 NOTE — Telephone Encounter (Signed)
Late Entry: We called pt on 9/8 to inform him LINQ recorder detected new A Fib (pt is post stroke), and explained that we needed him to come for an office visit to been seen and discuss. Scheduled patient to be seen 9/9, but he did not come to appt, stating he had no ride. (he stated that his sons would not hear) We asked to contact his dtr to discuss getting him in for office visit. He gave me her a number yesterday afternoon, but I was unable to get in touch with her via work phone number.  Called patient last night who handed phone to his dtr when I called. She explained strained family dynamics and dad's unstable mood.  I explained what was going on with dad and having discussion with physician. Asked if she could bring him today, but she is unable to get him up by herself to transport him. Due to this issue I told her I would discuss situation with Dr. Graciela Husbands this morning and let her know his recommendations. She was extremely grateful for detailed info given and help with her dad.

## 2014-01-27 NOTE — Telephone Encounter (Signed)
Informed dtr Dr. Graciela Husbands spoke with PCP, Dr. Sherene Sires, this morning.  Recommendations from Sherene Sires is to do nothing at this time, pt is non-compliant, more risk than benefit. Dtr tells me that this is probable a good thing as she talked with dad last night after our conversation and explained what we discussed. Pt adaantly stated that he would not take another med anyway. She will call Dr. Sherene Sires for further questions, and had follow up at the end of the month with him.   She again thanked me for all my help in this matter.

## 2014-01-31 ENCOUNTER — Inpatient Hospital Stay: Payer: Medicare Other | Admitting: Internal Medicine

## 2014-01-31 NOTE — Discharge Summary (Signed)
Physician Discharge Summary  Patient ID: Calvin Merritt MRN: [id number] DOB/AGE: 1941-03-21 73 y.o.  Admit date: 01/05/2014 Discharge date: 01/22/2014  Discharge Diagnoses:  Principal Problem:   CVA (cerebral infarction) Active Problems:   HYPERTENSION   Depression   Aortic valve stenosis, moderate   Discharged Condition:  Stable   Labs:  Basic Metabolic Panel:    Component Value Date/Time   NA 143 01/10/2014 0706   K 4.8 01/10/2014 0706   CL 107 01/10/2014 0706   CO2 25 01/10/2014 0706   GLUCOSE 98 01/10/2014 0706   BUN 25* 01/10/2014 0706   CREATININE 1.37* 01/19/2014 0512   CALCIUM 9.7 01/10/2014 0706   GFRNONAA 50* 01/19/2014 0512   GFRAA 57* 01/19/2014 0512     CBC: CBC Latest Ref Rng 01/06/2014 01/02/2014 12/31/2013  WBC 4.0 - 10.5 K/uL 12.1(H) 10.0 -  Hemoglobin 13.0 - 17.0 g/dL 30.8 65.7 84.6  Hematocrit 39.0 - 52.0 % 43.5 39.2 49.0  Platelets 150 - 400 K/uL 279 233 -   Filed Vitals:   01/21/14 0809 01/21/14 1359 01/21/14 2128 01/22/14 0626  BP: 139/66 137/61 145/76 147/69  Pulse:  52 57 51  Temp:  98.6 F (37 C) 98.3 F (36.8 C) 98.4 F (36.9 C)  TempSrc:  Oral Oral Oral  Resp:  Weight:      SpO2:  97% 92% 93%    CBG: No results found for this basename: GLUCAP,  in the last 168 hours  Brief HPI:   Calvin Merritt is a 73 y.o. male with history of Hep C, familial spastic paraparesis with gait disorder, HTN, who was admitted on 12/31/13 with right sided weakness and slurred speech. He reported falling out of the truck and CT head in ED negative for hemorrhage. MRI brain showed  patchy areas of acute infarction in the left MCA territory, predominantly involving the parietal lobe and small acute right parietal infarct. MRA brain with 4 mm abnormality along the posterior aspect of the proximal right PCA described on CTA remains indeterminate with question of partially thrombosed aneurysm. Neurology recommends ASA for embolic stroke of unknown source. TEE with  severely calcified aortic valve with moderate AS, EF 55-60%, small PFO, bubbles cross with valsalva and grade III plaque in descending thoracic aorta. BLE dopplers without DVT.  Loop recorder to be placed by Dr. Graciela Husbands on 08/19. Patient with  improvement in RUE strength but continues to have ataxia RUE/RLE, sensory deficits, decreased balance as well as poor safety awareness.  CIR was recommended for follow up therapy.    Hospital Course: [name] was admitted to rehab 01/05/2014 for inpatient therapies to consist of PT and OT at least three hours five days a week. Past admission physiatrist, therapy team and rehab RN have worked together to provide customized collaborative inpatient rehab. Blood pressures were monitored on tid basis and Norvasc was resumed and titrated to home dose for BP management. Lisinopril was held due to renal insufficiency. He was noted to be labile at admission due to adjustment reaction as well as ongoing issues surrounding his divorce.  He was willing to start Lexapro to help with multifactorial depression.  Dr. Wylene Simmer neuropsychology follow up revealed anger surrounding divorce but no evidence of clinically significant mood disruption. He was encouraged to seek psychotherapy past discharge. He has progressed to supervision level. He has increased BLE extensor tone, right sided weakness with sensory deficits as well as  impulsivity, decreased safety awareness and judgement therefore  supervision is recommended at discharge. He was discharged to home on 01/22/14 as has refused SNF or hiring assistance as per team's recommendations.  He will continue to receive HHPT, HHOT, HHRN and SW for follow up past discharge.    Rehab course: During patient's stay in rehab weekly team conferences were held to monitor patient's progress, set goals and discuss barriers to discharge. He required moderate assist with basic ADLs, min assist with bed mobility and moderate assist with gait at  admission. He  has had improvement in activity tolerance, balance, postural control, awareness as well as ability to compensate for deficits. He is has had improvement in functional use RUE  and RLE  as well as improved awareness He requires supervision for bathing and upper body dressing. He requires steady assist for lower body dressing and toileting. He is able to transfers and able to ambulate 150 feet with weighted walker with right foot up brace with supervision. Family was contacted regarding education as well as supervision but was unable to come in for this.    Disposition: 01-Home or Self Care  Diet: Low fat. Low cholesterol  Special Instructions: 1. No Driving. 2. No alcohol.     Medication List    STOP taking these medications       benazepril 20 MG tablet  Commonly known as:  LOTENSIN     furosemide 20 MG tablet  Commonly known as:  LASIX      TAKE these medications       acetaminophen 325 MG tablet  Commonly known as:  TYLENOL  Take 1-2 tablets (325-650 mg total) by mouth every 4 (four) hours as needed for mild pain.     amLODipine 10 MG tablet  Commonly known as:  NORVASC  Take 1 tablet (10 mg total) by mouth daily.     aspirin 325 MG EC tablet  Take 1 tablet (325 mg total) by mouth daily.     atorvastatin 10 MG tablet  Commonly known as:  LIPITOR  Take 1 tablet (10 mg total) by mouth daily at 6 PM.     baclofen 10 MG tablet  Commonly known as:  LIORESAL  Take 1 tablet (10 mg total) by mouth 3 (three) times daily.     escitalopram 5 MG tablet  Commonly known as:  LEXAPRO  Take 1 tablet (5 mg total) by mouth at bedtime. For depression     multivitamin tablet  Take 1 tablet by mouth daily.     pantoprazole 40 MG tablet  Commonly known as:  PROTONIX  Take 1 tablet (40 mg total) by mouth daily.       Follow-up Information   Follow up with Sandrea Hughs, MD On 01/31/2014. (APPT @ 11;30 AM)    Specialty:  Pulmonary Disease   Contact information:    [address] [place] [address] [phone]       Call Erick Colace, MD. (for appointment in 4 weeks)    Specialty:  Physical Medicine and Rehabilitation   Contact information:   [address] [place] [address] [place] [address] [phone]       Follow up with Xu,Jindong, MD. Call in 3 days. (for appointment in 6 weeks)    Specialty:  Neurology   Contact information:   [address] [place] [address] [phone]       Signed: Jacquelynn Cree 01/31/2014, 12:00 PM

## 2014-02-04 ENCOUNTER — Ambulatory Visit (INDEPENDENT_AMBULATORY_CARE_PROVIDER_SITE_OTHER): Payer: Medicare Other | Admitting: *Deleted

## 2014-02-04 DIAGNOSIS — G4733 Obstructive sleep apnea (adult) (pediatric): Secondary | ICD-10-CM

## 2014-02-04 DIAGNOSIS — Q211 Atrial septal defect: Secondary | ICD-10-CM

## 2014-02-04 DIAGNOSIS — I69959 Hemiplegia and hemiparesis following unspecified cerebrovascular disease affecting unspecified side: Secondary | ICD-10-CM

## 2014-02-04 DIAGNOSIS — E785 Hyperlipidemia, unspecified: Secondary | ICD-10-CM

## 2014-02-04 DIAGNOSIS — I639 Cerebral infarction, unspecified: Secondary | ICD-10-CM

## 2014-02-04 DIAGNOSIS — Q2111 Secundum atrial septal defect: Secondary | ICD-10-CM | POA: Diagnosis not present

## 2014-02-04 DIAGNOSIS — I6350 Cerebral infarction due to unspecified occlusion or stenosis of unspecified cerebral artery: Secondary | ICD-10-CM

## 2014-02-04 DIAGNOSIS — H479 Unspecified disorder of visual pathways: Secondary | ICD-10-CM | POA: Diagnosis not present

## 2014-02-04 DIAGNOSIS — I10 Essential (primary) hypertension: Secondary | ICD-10-CM

## 2014-02-04 DIAGNOSIS — F3289 Other specified depressive episodes: Secondary | ICD-10-CM

## 2014-02-04 DIAGNOSIS — K118 Other diseases of salivary glands: Secondary | ICD-10-CM

## 2014-02-04 DIAGNOSIS — F329 Major depressive disorder, single episode, unspecified: Secondary | ICD-10-CM

## 2014-02-04 LAB — MDC_IDC_ENUM_SESS_TYPE_REMOTE: Date Time Interrogation Session: 20150915040500

## 2014-02-07 ENCOUNTER — Telehealth: Payer: Self-pay | Admitting: *Deleted

## 2014-02-07 NOTE — Telephone Encounter (Signed)
Called to request verbal order for PT plan of care 2wk3.  Verbal approval given per protocol.

## 2014-02-08 NOTE — Progress Notes (Signed)
Loop recorder 

## 2014-02-09 ENCOUNTER — Encounter: Payer: Self-pay | Admitting: Internal Medicine

## 2014-02-15 ENCOUNTER — Ambulatory Visit: Payer: Medicare Other | Admitting: Internal Medicine

## 2014-02-18 ENCOUNTER — Encounter: Payer: Self-pay | Admitting: Internal Medicine

## 2014-02-18 ENCOUNTER — Ambulatory Visit (INDEPENDENT_AMBULATORY_CARE_PROVIDER_SITE_OTHER): Payer: Medicare Other | Admitting: Internal Medicine

## 2014-02-18 VITALS — BP 142/80 | HR 98 | Temp 98.4°F | Wt 190.0 lb

## 2014-02-18 DIAGNOSIS — I10 Essential (primary) hypertension: Secondary | ICD-10-CM

## 2014-02-18 DIAGNOSIS — I6340 Cerebral infarction due to embolism of unspecified cerebral artery: Secondary | ICD-10-CM

## 2014-02-18 DIAGNOSIS — F4321 Adjustment disorder with depressed mood: Secondary | ICD-10-CM

## 2014-02-18 DIAGNOSIS — Z0000 Encounter for general adult medical examination without abnormal findings: Secondary | ICD-10-CM

## 2014-02-18 MED ORDER — ESCITALOPRAM OXALATE 10 MG PO TABS: 10.0000 mg | ORAL_TABLET | Freq: Every day | ORAL | Status: DC

## 2014-02-18 NOTE — Progress Notes (Signed)
Subjective:     Patient ID: Calvin Merritt, male   DOB: Oct 27, 1940    MRN: [id number]   Brief patient profile:  [age] yowm never smoker with hypertension and hyperlipidemia and familial  spastic paraparesis Followed as Primary Care Patient/ [facility]/ Sheera Illingworth      History of Present Illness   08/19/2013 f/u ov/Takara Sermons re:  hbp, hyperlipidemia, spastic paraplegia Chief Complaint  Patient presents with  . Annual Exam    Pt here for physical.  Is requesting hip x-ray.  Fallen 3X since lv.  Also states his R index and middle finger feel numb X7-8 mos.     No change gait issues, walking with two canes.  Reports wife moved out since previous ov, never tried ED drugs, denies depression but not cooking that well for himself and loosing wt rec No change rx  Admit date: 01/05/2014  Discharge date: 01/22/2014  Discharge Diagnoses:  Principal Problem:  CVA (cerebral infarction)  Active Problems:  HYPERTENSION  Depression  Aortic valve stenosis, moderate       Brief HPI: Calvin Merritt is a 73 y.o. male with history of Hep C, familial spastic paraparesis with gait disorder, HTN, who was admitted on 12/31/13 with right sided weakness and slurred speech. He reported falling out of the truck and CT head in ED negative for hemorrhage. MRI brain showed patchy areas of acute infarction in the left MCA territory, predominantly involving the parietal lobe and small acute right parietal infarct. MRA brain with 4 mm abnormality along the posterior aspect of the proximal right PCA described on CTA remains indeterminate with question of partially thrombosed aneurysm. Neurology recommends ASA for embolic stroke of unknown source. TEE with severely calcified aortic valve with moderate AS, EF 55-60%, small PFO, bubbles cross with valsalva and grade III plaque in descending thoracic aorta. BLE dopplers without DVT. Loop recorder to be placed by Dr. Graciela HusbandsKlein on 08/19. Patient with improvement in RUE strength but continues  to have ataxia RUE/RLE, sensory deficits, decreased balance as well as poor safety awareness. CIR was recommended for follow up therapy.  Hospital Course: Calvin Merritt was admitted to rehab 01/05/2014 for inpatient therapies to consist of PT and OT at least three hours five days a week. Past admission physiatrist, therapy team and rehab RN have worked together to provide customized collaborative inpatient rehab. Blood pressures were monitored on tid basis and Norvasc was resumed and titrated to home dose for BP management. Lisinopril was held due to renal insufficiency. He was noted to be labile at admission due to adjustment reaction as well as ongoing issues surrounding his divorce. He was willing to start Lexapro to help with multifactorial depression. Dr. Wylene SimmerPollard/ neuropsychology follow up revealed anger surrounding divorce but no evidence of clinically significant mood disruption. He was encouraged to seek psychotherapy past discharge. He has progressed to supervision level. He has increased BLE extensor tone, right sided weakness with sensory deficits as well as impulsivity, decreased safety awareness and judgement therefore supervision is recommended at discharge. He was discharged to home on 01/22/14 as has refused SNF or hiring assistance as per team's recommendations. He will continue to receive HHPT, HHOT, HHRN and SW for follow up past discharge.     02/18/2014 post hosp f/u ov/Calvin Merritt re: hbp/ cva/ ? Risk anticoagulation Chief Complaint  Patient presents with  . Follow-up    Pt reports had a stroke 12/31/13.  He states that he is feeling well and    adamant that  he doesn't need help but at baseline he was at very high risk of fallilng and now dealing with effects of cva with R sided weakness.   Sleeping ok without nocturnal  or early am exacerbation  of respiratory  c/o's  Also denies any obvious fluctuation of symptoms with weather or environmental changes or other aggravating or alleviating  factors except as outlined above   ROS  The following are not active complaints unless bolded sore throat, dysphagia, dental problems, itching, sneezing,  nasal congestion or excess/ purulent secretions, ear ache,   fever, chills, sweats, unintended wt loss, pleuritic or exertional cp, hemoptysis,  orthopnea pnd or leg swelling, presyncope, palpitations, heartburn, abdominal pain, anorexia, nausea, vomiting, diarrhea  or change in bowel or urinary habits, change in stools or urine, dysuria,hematuria,  rash, arthralgias, visual complaints, headache, numbness weakness or ataxia or problems with walking or coordination,  change in mood/affect or memory.      Allergies   No Known Drug Allergies    Past Medical History:  Hyperlipidemia  - Target LDL < 130 (hbp, male gender)  Hypertension  Spastic Paraparesis.............................................................................[facility]   Health Maintenance..............................................................................Marland KitchenWert  - DT 05/2005 - Pneumovax 05/2005 (age 87) - prevnar 08/19/2013  - CPX  08/19/2013  Hep C antibody positive  Diverticulosis  - Colonoscopy 01/29/2007  Left acetabular fx 10/12/09 w/ hospitalization/rehab x4 weeks at [facility].............Marland KitchenNoralyn Pick   Past Surgical History:  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 His mother had hypertension and died of an aneurysm. His father may have died of a heart attack at the age of 57.  spastic paraparesis both sisters   Social History:  Patient never smoked.  No ETOH  retired maintenance          Objective:   Physical Exam     elderly wm depressed affect wt  206 March 17, 2008 > 209 June 10, 2008 > 186 May 08, 2010 > 10/25/2010  217> 01/14/2012  209 > 06/22/2012  207 > 12/22/2012  189 > 185 08/19/13   HEENT: nl dentition, turbinates, and orophanx. partial wax in both ears with cough reflex noted  Neck without JVD/Nodes/TM/ nl carotids, no bruits.  Lungs clear to A and P bilaterally without cough on insp or exp maneuvers  RRR no s3 or murmur or increase in P2. trace edema.  Abd soft and benign with nl excursion in the supine position.  Ext warm without calf tenderness, cyanosis clubbing  Neuro w/c bound now                 Assessment:

## 2014-02-18 NOTE — Patient Instructions (Addendum)
Lexapro (escitalapram) :  increase to 10 mg daily (take 5 mg x 2 daily until new Rx  Please see patient coordinator before you leave today  to schedule internal medicine referral   Pulmonary follow up is as needed

## 2014-02-19 DIAGNOSIS — F4321 Adjustment disorder with depressed mood: Secondary | ICD-10-CM | POA: Insufficient documentation

## 2014-02-19 DIAGNOSIS — F4320 Adjustment disorder, unspecified: Secondary | ICD-10-CM | POA: Insufficient documentation

## 2014-02-19 NOTE — Assessment & Plan Note (Signed)
Started on lexapro 12/2013 by psych, increased to 10 mg daily as still very angry re divorce  to point of interfering with adequate self care, declines f/u with pysch

## 2014-02-19 NOTE — Assessment & Plan Note (Signed)
Onset 12/31/13 Patchy left MCA and  right parietal, embolic, source unknown, s/p partial dose IV tPA  Discussed with Dr Graciela HusbandsKlein, ideally should be on full anticoagulation but at baseline he was at very high risk falling and more so now, especially since demands he be left alone and declines fm help.  Discussed in detail all the  indications, usual  risks and alternatives  relative to the benefits with patient who agrees to proceed with just asa 325 mg daily

## 2014-02-19 NOTE — Assessment & Plan Note (Signed)
Marginally Adequate control on present rx, reviewed > no change in rx needed  For now > referred to primary care for f/u

## 2014-02-28 ENCOUNTER — Telehealth: Payer: Self-pay | Admitting: *Deleted

## 2014-02-28 NOTE — Telephone Encounter (Signed)
Calvin Merritt ent is being discharged today and need referral [facility] - [facility] be done thru [employer]

## 2014-03-04 ENCOUNTER — Encounter: Payer: Self-pay | Admitting: Internal Medicine

## 2014-03-07 ENCOUNTER — Ambulatory Visit (INDEPENDENT_AMBULATORY_CARE_PROVIDER_SITE_OTHER): Payer: Medicare Other | Admitting: *Deleted

## 2014-03-07 DIAGNOSIS — I639 Cerebral infarction, unspecified: Secondary | ICD-10-CM

## 2014-03-08 NOTE — Telephone Encounter (Signed)
Please set up [facility]

## 2014-03-09 ENCOUNTER — Encounter: Payer: Self-pay | Admitting: Internal Medicine

## 2014-03-16 ENCOUNTER — Other Ambulatory Visit: Payer: Self-pay | Admitting: Internal Medicine

## 2014-03-18 NOTE — Progress Notes (Signed)
Loop recorder 

## 2014-03-22 LAB — MDC_IDC_ENUM_SESS_TYPE_REMOTE: Date Time Interrogation Session: 20151019040500

## 2014-03-29 ENCOUNTER — Encounter: Payer: Self-pay | Admitting: Internal Medicine

## 2014-04-01 ENCOUNTER — Other Ambulatory Visit: Payer: Self-pay | Admitting: Physical Medicine and Rehabilitation

## 2014-04-05 ENCOUNTER — Ambulatory Visit (INDEPENDENT_AMBULATORY_CARE_PROVIDER_SITE_OTHER): Payer: Medicare Other | Admitting: *Deleted

## 2014-04-05 DIAGNOSIS — I639 Cerebral infarction, unspecified: Secondary | ICD-10-CM

## 2014-04-07 ENCOUNTER — Encounter: Payer: Self-pay | Admitting: Internal Medicine

## 2014-04-07 ENCOUNTER — Ambulatory Visit (INDEPENDENT_AMBULATORY_CARE_PROVIDER_SITE_OTHER): Payer: Medicare Other | Admitting: Internal Medicine

## 2014-04-07 VITALS — BP 160/84 | HR 83 | Temp 98.7°F | Resp 18 | Ht 73.0 in | Wt 180.0 lb

## 2014-04-07 DIAGNOSIS — R220 Localized swelling, mass and lump, head: Secondary | ICD-10-CM

## 2014-04-07 DIAGNOSIS — I10 Essential (primary) hypertension: Secondary | ICD-10-CM

## 2014-04-07 DIAGNOSIS — K118 Other diseases of salivary glands: Secondary | ICD-10-CM

## 2014-04-07 DIAGNOSIS — I639 Cerebral infarction, unspecified: Secondary | ICD-10-CM

## 2014-04-07 DIAGNOSIS — I6340 Cerebral infarction due to embolism of unspecified cerebral artery: Secondary | ICD-10-CM

## 2014-04-07 DIAGNOSIS — E785 Hyperlipidemia, unspecified: Secondary | ICD-10-CM

## 2014-04-07 DIAGNOSIS — F329 Major depressive disorder, single episode, unspecified: Secondary | ICD-10-CM

## 2014-04-07 DIAGNOSIS — Z0000 Encounter for general adult medical examination without abnormal findings: Secondary | ICD-10-CM

## 2014-04-07 DIAGNOSIS — F32A Depression, unspecified: Secondary | ICD-10-CM

## 2014-04-07 NOTE — Patient Instructions (Signed)
We will send you back to the people who put the device near your heart. They will check it and hopefully remove it.  We will see you back in 6 months.  Please feel free to call our office with any problems or questions before then.

## 2014-04-07 NOTE — Progress Notes (Signed)
Pre visit review using our clinic review tool, if applicable. No additional management support is needed unless otherwise documented below in the visit note. 

## 2014-04-08 MED ORDER — ESCITALOPRAM OXALATE 10 MG PO TABS: 10.0000 mg | ORAL_TABLET | Freq: Every day | ORAL | Status: DC

## 2014-04-08 NOTE — Assessment & Plan Note (Signed)
Lipid panel was at goal while on statin therapy. He is currently not taking his statin. Encouraged him to resume taking.

## 2014-04-08 NOTE — Assessment & Plan Note (Addendum)
We'll need to look through records to see if he is ever had colonoscopy. He is not a good candidate for colonoscopy at this time given that he lives alone and is a high fall risk. He does not want the flu shot. Declines that at this visit. Has had Prevnar and pneumonia shot. Tetanus shot up to date. Will also look her records to see if he's ever had a bone density scan. Given his history of fractured hip fracture this may be worthwhile.

## 2014-04-08 NOTE — Assessment & Plan Note (Signed)
We'll need to look back through records to find documentation of this problem. To see if any follow-up is required. Nonpalpable on today's exam.

## 2014-04-08 NOTE — Assessment & Plan Note (Addendum)
Currently on calcium channel blocker, ACE-I, Lasix. It is unclear to me if the Lasix was started for swelling or for blood pressure. It may be worthwhile to try to combine ACE-I with hydrochlorothiazide if the Lasix was indeed started for blood pressure as this is a better blood pressure agent. We'll see him back shortly and can make adjustments at that time.

## 2014-04-08 NOTE — Assessment & Plan Note (Addendum)
He is currently taking only Lexapro 5 mg although he is supposed to be uptitrating to Lexapro 10 mg. Will call in refill for the 10 mg. He did not wish to follow-up with counseling. Denies SI/HI.

## 2014-04-08 NOTE — Assessment & Plan Note (Signed)
Unclear etiology to me. He is currently not taking statin and I encouraged him to resume taking them. He is still taking aspirin daily. His BP is a little high however did not take his medications today. Last BP was better on medication. Will refer to EP for follow-up on his implantable recording device. No documentation of atrial flutter during his hospitalization with telemetry. He was thought to have been a good candidate for full anticoagulation all the time however with his multiple falls he was thought to be a poor candidates as he is currently living alone.

## 2014-04-08 NOTE — Progress Notes (Signed)
   Subjective:    Patient ID: Calvin Merritt, male    DOB: 1940/10/29, 73 y.o.   MRN: [id number]  HPI The patient is a 73 year old man who comes in today to establish care. He has recent history of stroke in August of this year, her hereditary spastic paraplegia, is currently depressed with some grief, had hip fracture in 2011. He is doing fairly well overall, he is able to walk short distances at home, but uses wheelchair for long distances. He is able to perform all his ADLs. His does still have balance problems and occasionally stumbles. He denies any injuries from falls. Denies any chest pains, new symptoms of stroke. He states that they did put an implantable recorder in him of his hospital and he has never followed up with them about that. He is currently in the middle of divorce and his wife did leave him about 2 years ago after he had his hip fracture. He is not able to cook for himself, Meals on Wheels does come to his house and he states that he fixes himself a sandwich for another meal.  Review of Systems  Constitutional: Positive for fatigue. Negative for fever, activity change, appetite change and unexpected weight change.  HENT: Negative.   Eyes: Negative.   Respiratory: Negative for cough, chest tightness, shortness of breath and wheezing.   Cardiovascular: Negative for chest pain, palpitations and leg swelling.  Gastrointestinal: Negative for abdominal pain, diarrhea, constipation, blood in stool and abdominal distention.  Musculoskeletal: Negative.   Skin: Negative.   Neurological: Negative.   Psychiatric/Behavioral: Negative.       Objective:   Physical Exam  Constitutional: He is oriented to person, place, and time. He appears well-developed and well-nourished. No distress.  HENT:  Head: Normocephalic and atraumatic.  Eyes: EOM are normal. Pupils are equal, round, and reactive to light.  Neck: Normal range of motion. Neck supple. No JVD present.  Cardiovascular: Normal  rate, regular rhythm and intact distal pulses.   Implantable recorder device in place  Pulmonary/Chest: Effort normal and breath sounds normal. No respiratory distress. He has no wheezes. He has no rales.  Abdominal: Soft. Bowel sounds are normal. He exhibits no distension. There is no tenderness. There is no rebound.  Musculoskeletal: He exhibits no edema.  Neurological: He is alert and oriented to person, place, and time. Coordination normal.  Skin: Skin is warm and dry.   Filed Vitals:   04/07/14 0931  BP: 160/84  Pulse: 83  Temp: 98.7 F (37.1 C)  TempSrc: Oral  Resp: 18  Height: 6\' 1"  (1.854 m)  Weight: 180 lb (81.647 kg)  SpO2: 92%      Assessment & Plan:

## 2014-04-12 NOTE — Progress Notes (Signed)
Loop recorder 

## 2014-04-19 ENCOUNTER — Encounter: Payer: Self-pay | Admitting: Internal Medicine

## 2014-04-22 ENCOUNTER — Encounter: Payer: Self-pay | Admitting: Internal Medicine

## 2014-04-22 ENCOUNTER — Other Ambulatory Visit: Payer: Self-pay | Admitting: Physical Medicine and Rehabilitation

## 2014-04-27 ENCOUNTER — Ambulatory Visit (INDEPENDENT_AMBULATORY_CARE_PROVIDER_SITE_OTHER): Payer: Medicare Other | Admitting: Internal Medicine

## 2014-04-27 ENCOUNTER — Encounter: Payer: Self-pay | Admitting: Internal Medicine

## 2014-04-27 VITALS — BP 122/78 | HR 62 | Ht 73.0 in | Wt 178.0 lb

## 2014-04-27 DIAGNOSIS — I10 Essential (primary) hypertension: Secondary | ICD-10-CM

## 2014-04-27 DIAGNOSIS — I639 Cerebral infarction, unspecified: Secondary | ICD-10-CM

## 2014-04-27 DIAGNOSIS — I480 Paroxysmal atrial fibrillation: Secondary | ICD-10-CM

## 2014-04-27 DIAGNOSIS — I6340 Cerebral infarction due to embolism of unspecified cerebral artery: Secondary | ICD-10-CM

## 2014-04-27 DIAGNOSIS — Z959 Presence of cardiac and vascular implant and graft, unspecified: Secondary | ICD-10-CM

## 2014-04-27 LAB — MDC_IDC_ENUM_SESS_TYPE_INCLINIC

## 2014-04-27 NOTE — Progress Notes (Signed)
Patient Care Team: Calvin BonusElizabeth A Kollar, MD as PCP - General (Internal Medicine)   HPI  Calvin Merritt is a 73 y.o. male Seen in follow-up for loop recorder implanted for cryptogenic stroke.  Loop recorder   interrogation demonstrated atrial fibrillation. He has had no symptoms of palpitations and no episodic changes in functional status  Echocardiogram 8/15 demonstrated normal LV function  Past Medical History  Diagnosis Date  . Hyperlipidemia   . Hypertension   . Spastic paraparesis   . 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 Acetabular fracture     left  . Diverticulosis   . PFO (patent foramen ovale) 01/05/2014  . 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 Aortic valve stenosis, moderate 01/05/2014    Past Surgical History  Procedure Laterality Date  . Nephrectomy Right 11/05  . Hip surgery  05/11    L-acetabular fracture  . Tee without cardioversion N/A 01/04/2014    Procedure: TRANSESOPHAGEAL ECHOCARDIOGRAM (TEE);  Surgeon: Laurey Moralealton S McLean, MD;  [facility];  Service: Cardiovascular;  Laterality: N/A;    Current Outpatient Prescriptions  Medication Sig Dispense Refill  . amLODipine (NORVASC) 10 MG tablet Take 1 tablet (10 mg total) by mouth daily. 30 tablet 1  . aspirin EC 325 MG EC tablet Take 1 tablet (325 mg total) by mouth daily. 30 tablet 0  . baclofen (LIORESAL) 10 MG tablet Take 1 tablet (10 mg total) by mouth 3 (three) times daily. 90 each 1  . benazepril (LOTENSIN) 20 MG tablet Take 1 tablet by mouth daily.    Marland Kitchen. escitalopram (LEXAPRO) 10 MG tablet Take 1 tablet (10 mg total) by mouth daily. 90 tablet 3  . furosemide (LASIX) 20 MG tablet TAKE 1 TABLET (20 MG TOTAL) BY MOUTH DAILY. 30 tablet 5  . Multiple Vitamin (MULTIVITAMIN) tablet Take 1 tablet by mouth daily.    Marland Kitchen. atorvastatin (LIPITOR) 10 MG tablet Take 1 tablet (10 mg total) by mouth  daily at 6 PM. (Patient not taking: Reported on 04/27/2014) 30 tablet 1   No current facility-administered medications for this visit.    No Known Allergies  Review of Systems negative except from HPI and PMH  Physical Exam BP 122/78 mmHg  Pulse 62  Ht 6\' 1"  (1.854 m)  Wt 178 lb (80.74 kg)  BMI 23.49 kg/m2 Well developed and well nourished in no acute distress HENT normal E scleral and icterus clear Neck Supple JVP flat; carotids brisk and full Clear to ausculation  Regular rate and rhythm, no murmurs gallops or rub Soft with active bowel sounds No clubbing cyanosis no Edema Alert and oriented,  nonambulatory following his stroke   Skin Warm and Dry   ECG demonstrates sinus rhythm at 62 Intervals 14/08/41  Assessment and  Plan  Stroke  Atrial fibrillation  Loop recorder in place  We will resume his statin based on the SPARCL trial. Atrial fibrillation has been detected on his monitor and so we will discontinue his aspirin and begin him on a NOAC  We discussed the use of the NOACs compared to Coumadin. We briefly reviewed the data of at least comparability in stroke prevention, bleeding and outcome. We discussed some of the new once wherein somewhat associated with decreased ischemic stroke risk, one to be taken daily, and has been shown to be comparable and bleeding risk  to aspirin.  We also discussed bleeding associated with warfarin as well as NOACs and a wall bleeding as a complication of all these drugs intracranial bleeding is more frequently associated with warfarin then the NOACs and a GI bleeding is more commonly associated with the latter  He will check with his pharmacy at the cost let us know which one to initiate.

## 2014-04-27 NOTE — Patient Instructions (Signed)
Your physician recommends that you continue on your current medications as directed. Please refer to the Current Medication list given to you today.  Your physician wants you to follow-up in: 6 months with Dr. Graciela HusbandsKlein. You will receive a reminder letter in the mail two months in advance. If you don't receive a letter, please call our office to schedule the follow-up appointment.  Please call the office when you have reached a decision about a NOAC.   Calvin Merritt [phone]

## 2014-04-28 ENCOUNTER — Encounter (HOSPITAL_COMMUNITY): Payer: Self-pay | Admitting: Internal Medicine

## 2014-04-28 LAB — MDC_IDC_ENUM_SESS_TYPE_REMOTE: Date Time Interrogation Session: 20151122050500

## 2014-04-29 ENCOUNTER — Other Ambulatory Visit: Payer: Self-pay | Admitting: Physical Medicine and Rehabilitation

## 2014-05-02 ENCOUNTER — Other Ambulatory Visit: Payer: Self-pay | Admitting: Internal Medicine

## 2014-05-06 ENCOUNTER — Ambulatory Visit (INDEPENDENT_AMBULATORY_CARE_PROVIDER_SITE_OTHER): Payer: Medicare Other | Admitting: *Deleted

## 2014-05-06 DIAGNOSIS — I639 Cerebral infarction, unspecified: Secondary | ICD-10-CM

## 2014-05-10 NOTE — Progress Notes (Signed)
Loop recorder 

## 2014-05-14 ENCOUNTER — Other Ambulatory Visit: Payer: Self-pay | Admitting: Physical Medicine and Rehabilitation

## 2014-05-18 ENCOUNTER — Other Ambulatory Visit: Payer: Self-pay | Admitting: Geriatric Medicine

## 2014-05-18 ENCOUNTER — Other Ambulatory Visit: Payer: Self-pay | Admitting: Physical Medicine and Rehabilitation

## 2014-05-18 ENCOUNTER — Encounter: Payer: Self-pay | Admitting: Internal Medicine

## 2014-05-18 ENCOUNTER — Telehealth: Payer: Self-pay | Admitting: Internal Medicine

## 2014-05-18 DIAGNOSIS — E785 Hyperlipidemia, unspecified: Secondary | ICD-10-CM

## 2014-05-18 MED ORDER — ATORVASTATIN CALCIUM 10 MG PO TABS: 10.0000 mg | ORAL_TABLET | Freq: Every day | ORAL | Status: DC

## 2014-05-18 MED ORDER — BACLOFEN 10 MG PO TABS: 10.0000 mg | ORAL_TABLET | Freq: Three times a day (TID) | ORAL | Status: DC

## 2014-05-18 NOTE — Telephone Encounter (Signed)
Atorvastatin 10 mg, Baclofen 10 mg, Pantotrazole 40 mg - pt out of all 3 meds X 3 days. Pharmacy denied. Pls advise  [facility]

## 2014-05-18 NOTE — Telephone Encounter (Signed)
Sent to pharmacy 

## 2014-05-19 ENCOUNTER — Encounter: Payer: Self-pay | Admitting: Internal Medicine

## 2014-05-24 ENCOUNTER — Telehealth: Payer: Self-pay | Admitting: *Deleted

## 2014-05-24 NOTE — Telephone Encounter (Signed)
Patient seen 12/9 for office visit with Dr. Graciela HusbandsKlein. He was supposed to check on prices for NOACs and call us with decision. (Never heard from him) Patient states that his insurance will only pay for one and it was too expensive - but he cannot remember which one. I further explained that we can get him paperwork to fill out for medication assistance - but will need to know which medication. Patient wants to know why he can't just take ASA like he has been doing -- I then explained reasoning for this med and his AFib. He will look for which med it was and asks me to call him back tomorrow around the same time. I informed him that I would attempt in the next day or two to return a call to him. Patient verbalized understanding.

## 2014-05-25 NOTE — Telephone Encounter (Signed)
Patient has not had a chance to look into meds. Requests return call tomorrow around 3:15 pm.

## 2014-05-31 LAB — MDC_IDC_ENUM_SESS_TYPE_REMOTE

## 2014-06-03 NOTE — Telephone Encounter (Signed)
Called patient again. He tells me that he would like to defer this until next month as he is dealing with a divorce and division of marital asset issues. Advised patient to call us back when he is ready to make his decision. We reviewed risks of not being on this medication with AFib. Patient verbalized understanding and agreeable to call when he is ready.

## 2014-06-06 ENCOUNTER — Ambulatory Visit (INDEPENDENT_AMBULATORY_CARE_PROVIDER_SITE_OTHER): Payer: Self-pay | Admitting: *Deleted

## 2014-06-06 DIAGNOSIS — I639 Cerebral infarction, unspecified: Secondary | ICD-10-CM

## 2014-06-06 LAB — MDC_IDC_ENUM_SESS_TYPE_REMOTE: Date Time Interrogation Session: 20160206050500

## 2014-06-07 NOTE — Progress Notes (Signed)
Loop recorder 

## 2014-06-16 ENCOUNTER — Encounter: Payer: Self-pay | Admitting: Internal Medicine

## 2014-06-27 ENCOUNTER — Encounter: Payer: Self-pay | Admitting: Internal Medicine

## 2014-07-05 ENCOUNTER — Ambulatory Visit (INDEPENDENT_AMBULATORY_CARE_PROVIDER_SITE_OTHER): Payer: Medicare Other | Admitting: *Deleted

## 2014-07-05 DIAGNOSIS — I639 Cerebral infarction, unspecified: Secondary | ICD-10-CM

## 2014-07-06 NOTE — Progress Notes (Signed)
Loop recorder 

## 2014-07-19 LAB — MDC_IDC_ENUM_SESS_TYPE_REMOTE: Date Time Interrogation Session: 20160217050500

## 2014-07-22 ENCOUNTER — Ambulatory Visit (INDEPENDENT_AMBULATORY_CARE_PROVIDER_SITE_OTHER): Payer: Medicare Other | Admitting: Internal Medicine

## 2014-07-22 ENCOUNTER — Encounter: Payer: Self-pay | Admitting: Internal Medicine

## 2014-07-22 VITALS — BP 138/76 | HR 64 | Temp 97.8°F | Resp 18 | Ht 73.0 in | Wt 185.6 lb

## 2014-07-22 DIAGNOSIS — F32A Depression, unspecified: Secondary | ICD-10-CM

## 2014-07-22 DIAGNOSIS — I480 Paroxysmal atrial fibrillation: Secondary | ICD-10-CM

## 2014-07-22 DIAGNOSIS — F329 Major depressive disorder, single episode, unspecified: Secondary | ICD-10-CM

## 2014-07-22 DIAGNOSIS — I639 Cerebral infarction, unspecified: Secondary | ICD-10-CM

## 2014-07-22 DIAGNOSIS — I6340 Cerebral infarction due to embolism of unspecified cerebral artery: Secondary | ICD-10-CM

## 2014-07-22 NOTE — Assessment & Plan Note (Signed)
New diagnosis since last visit. He has not started anticoagulation as per cardiology recommendation. Spent a significant amount of time talking with him about his high risk of future stroke and the need for anticoagulation. He will think about it. He has also stopped taking his statin and spent time talking to him about why this is not a good idea and also raises his risk of stroke significantly.

## 2014-07-22 NOTE — Assessment & Plan Note (Signed)
Has stopped taking lexapro. His sister indicated that he is more irritable and grouchy but he does not agree. Okay with stopping if he does not want but would recommend him to resume.

## 2014-07-22 NOTE — Progress Notes (Signed)
   Subjective:    Patient ID: Calvin Merritt, male    DOB: September 10, 1940, 74 y.o.   MRN: [id number]  HPI The patient is a 74 YO man who is coming in to follow up on his embolic stroke. He is still using the wheelchair to ambulate over long distances. He uses the walker at home for short distances. His legs are still very weak. He is still having numbness on his right side. He is with his sister today and she helps to provide history. He has stopped taking lipitor and lexapro since he did not like them. He is taking a baby aspirin instead of the 325 mg aspirin. He has been newly diagnosed with atrial fibrillation as the cause of his prior stroke with remote monitoring. He was advised to start blood thinner but he has not.    Review of Systems  Constitutional: Positive for fatigue. Negative for fever, activity change, appetite change and unexpected weight change.  HENT: Negative.   Eyes: Negative.   Respiratory: Negative for cough, chest tightness, shortness of breath and wheezing.   Cardiovascular: Negative for chest pain, palpitations and leg swelling.  Gastrointestinal: Negative for abdominal pain, diarrhea, constipation, blood in stool and abdominal distention.  Musculoskeletal: Negative.   Skin: Negative.   Neurological: Positive for weakness and numbness. Negative for dizziness, speech difficulty, light-headedness and headaches.  Psychiatric/Behavioral: Negative.       Objective:   Physical Exam  Constitutional: He is oriented to person, place, and time. He appears well-developed and well-nourished. No distress.  HENT:  Head: Normocephalic and atraumatic.  Eyes: EOM are normal. Pupils are equal, round, and reactive to light.  Neck: Normal range of motion. Neck supple. No JVD present.  Cardiovascular: Normal rate, regular rhythm and intact distal pulses.   Implantable recorder device in place  Pulmonary/Chest: Effort normal and breath sounds normal. No respiratory distress. He has no  wheezes. He has no rales.  Abdominal: Soft. Bowel sounds are normal. He exhibits no distension. There is no tenderness. There is no rebound.  Musculoskeletal: He exhibits no edema.  Neurological: He is alert and oriented to person, place, and time. A cranial nerve deficit is present. Coordination abnormal.  Some numbness to fine touch in the right arm  Skin: Skin is warm and dry.  Psychiatric:  Very irritable with his sister and interruptive   Filed Vitals:   07/22/14 1454  BP: 138/76  Pulse: 64  Temp: 97.8 F (36.6 C)  TempSrc: Oral  Resp: 18  Height: 6\' 1"  (1.854 m)  Weight: 185 lb 9.6 oz (84.188 kg)  SpO2: 95%      Assessment & Plan:  Visit time 25 minutes, at least 50% of which was spent in face to face counseling.

## 2014-07-22 NOTE — Patient Instructions (Addendum)
We can keep you off the lasix (furosemide) and the lexapro (escitalopram).  We think that you should be taking the cholesterol medicine atorvastatin. You can take it in the morning if you want. It is helping you to not have another stroke.  The other thing you should think about is taking a blood thinner as that is the other thing that can help you to not have another stroke. Please call the heart doctor's office if you are willing to do that. There are some of these that are only once a day same as the aspirin is.   We will send therapy to the house to help you with walking and balance.

## 2014-07-22 NOTE — Progress Notes (Signed)
Pre visit review using our clinic review tool, if applicable. No additional management support is needed unless otherwise documented below in the visit note. 

## 2014-08-02 ENCOUNTER — Encounter: Payer: Self-pay | Admitting: Internal Medicine

## 2014-08-04 ENCOUNTER — Ambulatory Visit (INDEPENDENT_AMBULATORY_CARE_PROVIDER_SITE_OTHER): Payer: Medicare Other | Admitting: *Deleted

## 2014-08-04 ENCOUNTER — Encounter: Payer: Self-pay | Admitting: Internal Medicine

## 2014-08-04 DIAGNOSIS — I639 Cerebral infarction, unspecified: Secondary | ICD-10-CM

## 2014-08-05 NOTE — Progress Notes (Signed)
Loop recorder 

## 2014-08-09 ENCOUNTER — Encounter: Payer: Self-pay | Admitting: Internal Medicine

## 2014-08-16 ENCOUNTER — Encounter: Payer: Self-pay | Admitting: Internal Medicine

## 2014-08-17 DIAGNOSIS — I6340 Cerebral infarction due to embolism of unspecified cerebral artery: Secondary | ICD-10-CM | POA: Diagnosis not present

## 2014-08-17 LAB — MDC_IDC_ENUM_SESS_TYPE_REMOTE: Date Time Interrogation Session: 20160314040500

## 2014-08-22 ENCOUNTER — Encounter: Payer: Self-pay | Admitting: Internal Medicine

## 2014-08-23 ENCOUNTER — Encounter: Payer: Self-pay | Admitting: Internal Medicine

## 2014-08-30 ENCOUNTER — Encounter: Payer: Self-pay | Admitting: Internal Medicine

## 2014-09-02 ENCOUNTER — Ambulatory Visit (INDEPENDENT_AMBULATORY_CARE_PROVIDER_SITE_OTHER): Payer: Medicare Other | Admitting: *Deleted

## 2014-09-02 DIAGNOSIS — I639 Cerebral infarction, unspecified: Secondary | ICD-10-CM

## 2014-09-07 NOTE — Progress Notes (Signed)
Loop recorder 

## 2014-09-09 ENCOUNTER — Other Ambulatory Visit: Payer: Self-pay | Admitting: Internal Medicine

## 2014-09-20 ENCOUNTER — Encounter: Payer: Self-pay | Admitting: Internal Medicine

## 2014-09-24 ENCOUNTER — Other Ambulatory Visit: Payer: Self-pay | Admitting: Internal Medicine

## 2014-09-29 LAB — CUP PACEART REMOTE DEVICE CHECK: MDC IDC SESS DTM: 20160410040500

## 2014-09-30 ENCOUNTER — Other Ambulatory Visit: Payer: Self-pay | Admitting: Internal Medicine

## 2014-10-03 ENCOUNTER — Ambulatory Visit (INDEPENDENT_AMBULATORY_CARE_PROVIDER_SITE_OTHER): Payer: Medicare Other | Admitting: *Deleted

## 2014-10-03 ENCOUNTER — Encounter: Payer: Self-pay | Admitting: Internal Medicine

## 2014-10-03 DIAGNOSIS — I639 Cerebral infarction, unspecified: Secondary | ICD-10-CM | POA: Diagnosis not present

## 2014-10-03 LAB — CUP PACEART REMOTE DEVICE CHECK: Date Time Interrogation Session: 20160513040500

## 2014-10-07 NOTE — Progress Notes (Signed)
Loop recorder 

## 2014-10-10 ENCOUNTER — Ambulatory Visit: Payer: Medicare Other | Admitting: Internal Medicine

## 2014-10-12 ENCOUNTER — Encounter: Payer: Self-pay | Admitting: Internal Medicine

## 2014-10-26 ENCOUNTER — Encounter: Payer: Self-pay | Admitting: Internal Medicine

## 2014-11-02 ENCOUNTER — Ambulatory Visit (INDEPENDENT_AMBULATORY_CARE_PROVIDER_SITE_OTHER): Payer: Medicare Other | Admitting: *Deleted

## 2014-11-02 DIAGNOSIS — I639 Cerebral infarction, unspecified: Secondary | ICD-10-CM | POA: Diagnosis not present

## 2014-11-02 NOTE — Progress Notes (Signed)
Loop recorder 

## 2014-11-03 ENCOUNTER — Encounter: Payer: Self-pay | Admitting: Internal Medicine

## 2014-11-07 ENCOUNTER — Encounter: Payer: Self-pay | Admitting: Internal Medicine

## 2014-11-10 LAB — CUP PACEART REMOTE DEVICE CHECK: MDC IDC SESS DTM: 20160616040500

## 2014-11-18 ENCOUNTER — Encounter: Payer: Self-pay | Admitting: *Deleted

## 2014-12-01 ENCOUNTER — Encounter: Payer: Self-pay | Admitting: Internal Medicine

## 2014-12-02 ENCOUNTER — Encounter: Payer: Self-pay | Admitting: Internal Medicine

## 2014-12-02 ENCOUNTER — Ambulatory Visit (INDEPENDENT_AMBULATORY_CARE_PROVIDER_SITE_OTHER): Payer: Medicare Other | Admitting: *Deleted

## 2014-12-02 DIAGNOSIS — I639 Cerebral infarction, unspecified: Secondary | ICD-10-CM | POA: Diagnosis not present

## 2014-12-04 ENCOUNTER — Other Ambulatory Visit: Payer: Self-pay | Admitting: Physical Medicine and Rehabilitation

## 2014-12-05 ENCOUNTER — Other Ambulatory Visit: Payer: Self-pay | Admitting: Internal Medicine

## 2014-12-06 ENCOUNTER — Other Ambulatory Visit: Payer: Self-pay | Admitting: Physical Medicine and Rehabilitation

## 2014-12-06 ENCOUNTER — Other Ambulatory Visit: Payer: Self-pay | Admitting: Internal Medicine

## 2014-12-06 ENCOUNTER — Encounter: Payer: Self-pay | Admitting: Internal Medicine

## 2014-12-06 NOTE — Progress Notes (Signed)
Loop recorder 

## 2014-12-07 ENCOUNTER — Encounter: Payer: Self-pay | Admitting: Internal Medicine

## 2014-12-09 LAB — CUP PACEART REMOTE DEVICE CHECK: MDC IDC SESS DTM: 20160715040500

## 2014-12-12 ENCOUNTER — Encounter: Payer: Self-pay | Admitting: Internal Medicine

## 2014-12-13 ENCOUNTER — Encounter: Payer: Self-pay | Admitting: *Deleted

## 2014-12-30 ENCOUNTER — Other Ambulatory Visit: Payer: Self-pay | Admitting: Internal Medicine

## 2014-12-30 LAB — CUP PACEART REMOTE DEVICE CHECK: Date Time Interrogation Session: 20160812154423

## 2015-01-02 ENCOUNTER — Encounter: Payer: Self-pay | Admitting: Internal Medicine

## 2015-01-02 ENCOUNTER — Ambulatory Visit (INDEPENDENT_AMBULATORY_CARE_PROVIDER_SITE_OTHER): Payer: Medicare Other | Admitting: *Deleted

## 2015-01-02 DIAGNOSIS — I639 Cerebral infarction, unspecified: Secondary | ICD-10-CM

## 2015-01-03 ENCOUNTER — Encounter: Payer: Self-pay | Admitting: Internal Medicine

## 2015-01-03 NOTE — Progress Notes (Signed)
Loop recorder 

## 2015-01-04 ENCOUNTER — Encounter: Payer: Self-pay | Admitting: Internal Medicine

## 2015-01-04 ENCOUNTER — Telehealth: Payer: Self-pay | Admitting: *Deleted

## 2015-01-04 LAB — CUP PACEART REMOTE DEVICE CHECK: Date Time Interrogation Session: 20160815040500

## 2015-01-04 NOTE — Telephone Encounter (Signed)
Called patient regarding tachy episodes on LINQ transmission.  Patient asymptomatic.  Explained to patient that his device has detected atrial fibrillation in the past and that he was asked to consider anticoagulation per office visit note from 04/27/14 and phone note from 05/24/14.  Patient states, "I don't have A-fib".  Reiterated device findings but patient adamant that he does not have atrial fibrillation.  Patient overdue for 6 month follow-up (due in June 2016).  Patient agreeable to appointment tomorrow, 01/05/15 at 12:15pm to discuss atrial fibrillation and anticoagulation.  Patient aware to call with worsening symptoms and denies questions or concerns at this time.

## 2015-01-05 ENCOUNTER — Telehealth: Payer: Self-pay | Admitting: *Deleted

## 2015-01-05 ENCOUNTER — Ambulatory Visit (INDEPENDENT_AMBULATORY_CARE_PROVIDER_SITE_OTHER): Payer: Medicare Other | Admitting: Internal Medicine

## 2015-01-05 ENCOUNTER — Encounter: Payer: Self-pay | Admitting: Internal Medicine

## 2015-01-05 VITALS — BP 108/80 | HR 66 | Ht 73.0 in | Wt 185.6 lb

## 2015-01-05 DIAGNOSIS — I480 Paroxysmal atrial fibrillation: Secondary | ICD-10-CM

## 2015-01-05 DIAGNOSIS — Z4509 Encounter for adjustment and management of other cardiac device: Secondary | ICD-10-CM

## 2015-01-05 NOTE — Patient Instructions (Signed)
Medication Instructions:  Your physician recommends that you continue on your current medications as directed. Please refer to the Current Medication list given to you today.  Labwork: None ordered  Testing/Procedures: None ordered  Follow-Up: Your physician recommends that you schedule a follow-up appointment in: 6 weeks with Rudi Coco, NP.  Your physician wants you to follow-up in: 1 year with Dr. Graciela Husbands.  You will receive a reminder letter in the mail two months in advance. If you don't receive a letter, please call our office to schedule the follow-up appointment.  Any Other Special Instructions Will Be Listed Below (If Applicable). Thank you for choosing [facility]!!

## 2015-01-05 NOTE — Telephone Encounter (Signed)
Opened in error

## 2015-01-05 NOTE — Progress Notes (Signed)
Patient Care Team: Judie Bonus, MD as PCP - General (Internal Medicine)   HPI  Calvin Merritt is a 74 y.o. male Seen in follow-up for loop recorder implanted for cryptogenic stroke.  Loop recorder   interrogation demonstrated atrial fibrillation. When seen last December we discussed blood thinners. He remains on aspirin.  The costs to him more insurmountable on his Social Security. He notes that he has been divorced recently and this is been very stressful.  The patient denies chest pain, shortness of breath, nocturnal dyspnea, orthopnea or peripheral edema.  There have been no palpitations, lightheadedness or syncope.   he  Echocardiogram 8/15 demonstrated normal LV function  Past Medical History  Diagnosis Date  . Hyperlipidemia   . Hypertension   . Spastic paraparesis   . 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 Acetabular fracture     left  . Diverticulosis   . PFO (patent foramen ovale) 01/05/2014  . 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 Aortic valve stenosis, moderate 01/05/2014    Past Surgical History  Procedure Laterality Date  . Nephrectomy Right 11/05  . Hip surgery  05/11    L-acetabular fracture  . Tee without cardioversion N/A 01/04/2014    Procedure: TRANSESOPHAGEAL ECHOCARDIOGRAM (TEE);  Surgeon: Laurey Morale, MD;  Location: [facility] ENDOSCOPY;  Service: Cardiovascular;  Laterality: N/A;  . Loop recorder implant N/A 01/05/2014    Procedure: LOOP RECORDER IMPLANT;  Surgeon: Duke Salvia, MD;  Location: [facility] CATH LAB;  Service: Cardiovascular;  Laterality: N/A;    Current Outpatient Prescriptions  Medication Sig Dispense Refill  . amLODipine (NORVASC) 10 MG tablet TAKE 1 TABLET BY MOUTH EVERY DAY 30 tablet 1  . aspirin 81 MG tablet Take 81 mg by mouth daily.    . baclofen (LIORESAL) 10 MG tablet Take 1 tablet (10 mg total) by mouth 3 (three)  times daily. 90 each 3  . benazepril (LOTENSIN) 20 MG tablet TAKE 1 TABLET (20 MG TOTAL) BY MOUTH DAILY. 30 tablet 5  . furosemide (LASIX) 20 MG tablet TAKE 1 TABLET (20 MG TOTAL) BY MOUTH DAILY. 30 tablet 5  . Multiple Vitamin (MULTIVITAMIN) tablet Take 1 tablet by mouth daily.    Marland Kitchen atorvastatin (LIPITOR) 10 MG tablet Take 1 tablet (10 mg total) by mouth daily at 6 PM. (Patient not taking: Reported on 07/22/2014) 30 tablet 3   No current facility-administered medications for this visit.    No Known Allergies  Review of Systems negative except from HPI and PMH  Physical Exam BP 108/80 mmHg  Pulse 66  Ht 6\' 1"  (1.854 m)  Wt 185 lb 9.6 oz (84.188 kg)  BMI 24.49 kg/m2 Well developed and well nourished in no acute distress HENT normal E scleral and icterus clear Neck Supple JVP flat; carotids brisk and full Clear to ausculation  Regular rate and rhythm, no murmurs gallops or rub Soft with active bowel sounds No clubbing cyanosis  Tr  Edema Alert and oriented,  nonambulatory following his stroke   Skin Warm and Dry  Loop recorder demonstrates persistent atrial fibrillation with a relatively rapid mean rate at 90  Assessment and  Plan  Stroke  Atrial fibrillation  Hypertension  Loop recorder in place  We will continue him on his statin.   Blood pressure is reasonably controlled;   Atrial fibrillation  has been persistent. The rates are relatively rapid. We will plan to discontinue the amlodipine which may be contributing to his edema; in its place we will begin Cardizem to help augment rate control.    he remains on aspirin. This is been a cost issue. We have given him information for LIS. He will work on getting enrolled with the help of his son. We will have him follow-up with DC and about 4-6 weeks at which time hopefully he will be able to give [place] some direction as to which anticoagulant is affordable.

## 2015-01-06 ENCOUNTER — Telehealth: Payer: Self-pay | Admitting: *Deleted

## 2015-01-06 ENCOUNTER — Encounter: Payer: Self-pay | Admitting: *Deleted

## 2015-01-06 LAB — CUP PACEART INCLINIC DEVICE CHECK
MDC IDC SESS DTM: 20160818162321
MDC IDC SET ZONE DETECTION INTERVAL: 2000 ms
MDC IDC SET ZONE DETECTION INTERVAL: 380 ms
Zone Setting Detection Interval: 3000 ms

## 2015-01-06 MED ORDER — DILTIAZEM HCL ER COATED BEADS 180 MG PO CP24: 180.0000 mg | ORAL_CAPSULE | Freq: Every day | ORAL | Status: DC

## 2015-01-06 NOTE — Progress Notes (Signed)
Event report for tachy episode printed for review. (1) tachy episode recorded on 8/18 @ 1305 x 17 sec @ 182bpm. Patient seen in ofc on 01/05/15. Cardizem started on 8/19---see Epic note for details. + ASA for persistent AF. Patient to F/U with Rudi Coco, NP in 6 weeks.

## 2015-01-06 NOTE — Telephone Encounter (Signed)
Called patient after reviewing ov note from 8/18. According to Dr.Klein's note patient was supposed to D/C Amlodipine and start Cardizem. Spoke to Dr.Nahser (DOD) about an appropriate dose of Cardizem to start the pt on. Dr.Nahser recommended Cardizem CD 180 mg daily. Informed patient about medication change and why the change was being made. Patient voiced understanding and stated that he would try the new medication for 1 month, but if he didn't like it he stated that he would D/C it. I advised him to call the office before stopping the medication. Again, patient voiced understanding.   Dory Horn, RN was made aware.

## 2015-01-12 ENCOUNTER — Encounter: Payer: Self-pay | Admitting: *Deleted

## 2015-01-12 NOTE — Progress Notes (Signed)
Tachy episode from 01/05/15 printed for review.  Episode appropriate, ECG shows AF, +ASA .  No additional anticoagulation at this time due to cost per Dr. Odessa Fleming note from 01/05/15.  Carelink alerts changed to AF management alerts.  Monthly summary reports and ROV with Rudi Coco, NP on 02/15/2015 at 1:30pm to discuss additional anticoagulation.

## 2015-01-24 ENCOUNTER — Other Ambulatory Visit (INDEPENDENT_AMBULATORY_CARE_PROVIDER_SITE_OTHER): Payer: Medicare Other

## 2015-01-24 ENCOUNTER — Encounter: Payer: Self-pay | Admitting: Internal Medicine

## 2015-01-24 ENCOUNTER — Ambulatory Visit (INDEPENDENT_AMBULATORY_CARE_PROVIDER_SITE_OTHER): Payer: Medicare Other | Admitting: Internal Medicine

## 2015-01-24 VITALS — BP 142/82 | HR 101 | Temp 98.6°F | Resp 18 | Ht 73.0 in | Wt 184.0 lb

## 2015-01-24 DIAGNOSIS — H9193 Unspecified hearing loss, bilateral: Secondary | ICD-10-CM

## 2015-01-24 DIAGNOSIS — I10 Essential (primary) hypertension: Secondary | ICD-10-CM | POA: Diagnosis not present

## 2015-01-24 DIAGNOSIS — I480 Paroxysmal atrial fibrillation: Secondary | ICD-10-CM

## 2015-01-24 DIAGNOSIS — E785 Hyperlipidemia, unspecified: Secondary | ICD-10-CM

## 2015-01-24 LAB — CBC
HEMATOCRIT: 49.9 % (ref 39.0–52.0)
HEMOGLOBIN: 16.8 g/dL (ref 13.0–17.0)
MCHC: 33.6 g/dL (ref 30.0–36.0)
MCV: 86.3 fl (ref 78.0–100.0)
PLATELETS: 298 10*3/uL (ref 150.0–400.0)
RBC: 5.78 Mil/uL (ref 4.22–5.81)
RDW: 14.5 % (ref 11.5–15.5)
WBC: 13.3 10*3/uL — ABNORMAL HIGH (ref 4.0–10.5)

## 2015-01-24 LAB — COMPREHENSIVE METABOLIC PANEL
ALBUMIN: 3.8 g/dL (ref 3.5–5.2)
ALK PHOS: 57 U/L (ref 39–117)
ALT: 12 U/L (ref 0–53)
AST: 12 U/L (ref 0–37)
BILIRUBIN TOTAL: 0.5 mg/dL (ref 0.2–1.2)
BUN: 20 mg/dL (ref 6–23)
CALCIUM: 9.6 mg/dL (ref 8.4–10.5)
CO2: 28 mEq/L (ref 19–32)
CREATININE: 1.47 mg/dL (ref 0.40–1.50)
Chloride: 106 mEq/L (ref 96–112)
GFR: 49.68 mL/min — AB (ref >60.00)
Glucose, Bld: 128 mg/dL — ABNORMAL HIGH (ref 70–99)
Potassium: 3.7 mEq/L (ref 3.5–5.1)
Sodium: 142 mEq/L (ref 135–145)
TOTAL PROTEIN: 6.9 g/dL (ref 6.0–8.3)

## 2015-01-24 LAB — LIPID PANEL
CHOLESTEROL: 194 mg/dL (ref 0–200)
HDL: 30.8 mg/dL — AB (ref >39.00)
NONHDL: 162.92
TRIGLYCERIDES: 262 mg/dL — AB (ref 0.0–149.0)
Total CHOL/HDL Ratio: 6
VLDL: 52.4 mg/dL — ABNORMAL HIGH (ref 0.0–40.0)

## 2015-01-24 LAB — LDL CHOLESTEROL, DIRECT: Direct LDL: 119 mg/dL

## 2015-01-24 NOTE — Patient Instructions (Signed)
We want you to move the diltiazem to night time. Do not take it tonight and start taking it in the evening tomorrow.   We will check some blood work today and let you know if you need to start taking cholesterol medicine again.

## 2015-01-24 NOTE — Progress Notes (Signed)
Pre visit review using our clinic review tool, if applicable. No additional management support is needed unless otherwise documented below in the visit note. 

## 2015-01-25 DIAGNOSIS — H9190 Unspecified hearing loss, unspecified ear: Secondary | ICD-10-CM | POA: Insufficient documentation

## 2015-01-25 NOTE — Assessment & Plan Note (Signed)
Suspect moderate hearing loss at least, both ears disimpacted. He does not want hearing test and does not think that his hearing is a problem for him.

## 2015-01-25 NOTE — Assessment & Plan Note (Signed)
Checking CMP today, on diltiazem, lasix, benazepril. Adjust as needed. BP at goal.

## 2015-01-25 NOTE — Progress Notes (Signed)
   Subjective:    Patient ID: Calvin Merritt, male    DOB: [birth year], 74 y.o.   MRN: [id number]  HPI The patient is a 74 YO man who is coming in today for ear pain and fullness. He has had ear wax removed in the past. He thinks it is affecting his hearing. He has decreased hearing in the right ear especially. Denies ringing or tinnitus. No fevers or chills. No sinus fullness or nasal congestion/drainage.   Review of Systems  Constitutional: Positive for fatigue. Negative for fever, activity change, appetite change and unexpected weight change.  HENT: Positive for ear discharge and hearing loss.   Eyes: Negative.   Respiratory: Negative for cough, chest tightness, shortness of breath and wheezing.   Cardiovascular: Negative for chest pain, palpitations and leg swelling.  Gastrointestinal: Negative for abdominal pain, diarrhea, constipation, blood in stool and abdominal distention.  Neurological: Positive for numbness. Negative for dizziness, speech difficulty, light-headedness and headaches.      Objective:   Physical Exam  Constitutional: He is oriented to person, place, and time. He appears well-developed and well-nourished. No distress.  Very hard of hearing  HENT:  Head: Normocephalic and atraumatic.  Left and right ear with wax obstruction, after disimpaction TMs normal  Eyes: EOM are normal. Pupils are equal, round, and reactive to light.  Neck: Normal range of motion. Neck supple. No JVD present.  Cardiovascular: Normal rate and regular rhythm.   Pulmonary/Chest: Effort normal and breath sounds normal. No respiratory distress. He has no wheezes. He has no rales.  Abdominal: Soft. Bowel sounds are normal. He exhibits no distension. There is no tenderness. There is no rebound.  Musculoskeletal: He exhibits no edema.  Neurological: He is alert and oriented to person, place, and time. Coordination abnormal.  Using wheelchair in the building, unsteady gait  Skin: Skin is warm and  dry.   Filed Vitals:   01/24/15 1433 01/24/15 1456  BP: 150/84 142/82  Pulse: 101   Temp: 98.6 F (37 C)   TempSrc: Oral   Resp: 18   Height:  (1.854 m)   Weight: 184 lb (83.462 kg)   SpO2: 98%       Assessment & Plan:

## 2015-01-25 NOTE — Assessment & Plan Note (Signed)
Cardiology did change his amlodipine to diltiazem for better control of HR. He was reminded about this at today's visit. He does feel the diltiazem is making him tired and advised that he change taking it to night time so that he does not have as much fatigue. If no improvement he will call cardiology for adjustment.

## 2015-01-25 NOTE — Assessment & Plan Note (Signed)
Checking lipid panel and adjust lipitor as needed.  

## 2015-01-31 ENCOUNTER — Ambulatory Visit (INDEPENDENT_AMBULATORY_CARE_PROVIDER_SITE_OTHER): Payer: Medicare Other | Admitting: *Deleted

## 2015-01-31 DIAGNOSIS — I639 Cerebral infarction, unspecified: Secondary | ICD-10-CM | POA: Diagnosis not present

## 2015-02-01 NOTE — Progress Notes (Signed)
Loop recorder 

## 2015-02-02 ENCOUNTER — Other Ambulatory Visit: Payer: Self-pay | Admitting: Internal Medicine

## 2015-02-08 ENCOUNTER — Encounter: Payer: Self-pay | Admitting: Internal Medicine

## 2015-02-15 ENCOUNTER — Ambulatory Visit (HOSPITAL_COMMUNITY)
Admission: RE | Admit: 2015-02-15 | Discharge: 2015-02-15 | Disposition: A | Discharge status: Home / Self Care | Payer: Medicare Other | Source: Ambulatory Visit | Attending: Nurse Practitioner | Admitting: Nurse Practitioner

## 2015-02-15 ENCOUNTER — Encounter (HOSPITAL_COMMUNITY): Payer: Self-pay | Admitting: Nurse Practitioner

## 2015-02-15 VITALS — BP 142/78 | HR 93 | Ht 73.0 in | Wt 185.0 lb

## 2015-02-15 DIAGNOSIS — I4891 Unspecified atrial fibrillation: Secondary | ICD-10-CM | POA: Diagnosis present

## 2015-02-15 DIAGNOSIS — I10 Essential (primary) hypertension: Secondary | ICD-10-CM | POA: Diagnosis not present

## 2015-02-15 NOTE — Progress Notes (Signed)
Patient ID: Calvin Merritt, male   DOB: 01-26-1941, 74 y.o.   MRN: [id number]     Primary Care Physician: Myrlene Broker, MD Referring Physician: Dr. Leonard Schwartz Calvin Merritt is a 74 y.o. male with a h/o afib, with ILR, CVA 12/31/13 that is in the afib clinic for f/u. Dr. Graciela Husbands asked pt to be seen here to further discuss taking blood thinners. Pt states he has lived a good life and he doesn't care if he has another stroke. He is afraid of  bleeding with blood thinners. We discussed all the blood thinners on market and he states that his pharmacist did one time say eliquis would cost him $300 a month and he cant afford. We talked about the assistance program but he is still not interested. He says he cannot not afford and he doesn't want to take anyway. He is rate controlled afib today and sounds as he is asymptomatic with afib. He is very flighty in his speech and flow of thoughts.  Today, he denies symptoms of palpitations, chest pain, shortness of breath, orthopnea, PND, lower extremity edema, dizziness, presyncope, syncope, or neurologic sequela. The patient is tolerating medications without difficulties and is otherwise without complaint today.   Past Medical History  Diagnosis Date  . Hyperlipidemia   . Hypertension   . Spastic paraparesis   . 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 Acetabular Merritt     left  . Diverticulosis   . PFO (patent foramen ovale) 01/05/2014  . 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 Aortic valve stenosis, moderate 01/05/2014   Past Surgical History  Procedure Laterality Date  . Nephrectomy Right 11/05  . Hip surgery  05/11    Calvin Merritt  . Tee without cardioversion N/A 01/04/2014    Procedure: TRANSESOPHAGEAL ECHOCARDIOGRAM (TEE);  Surgeon: Calvin Morale, MD;  Location: [facility] ENDOSCOPY;  Service: Cardiovascular;  Laterality: N/A;  . Loop  recorder implant N/A 01/05/2014    Procedure: LOOP RECORDER IMPLANT;  Surgeon: Calvin Salvia, MD;  Location: [facility] CATH LAB;  Service: Cardiovascular;  Laterality: N/A;    Current Outpatient Prescriptions  Medication Sig Dispense Refill  . aspirin 81 MG tablet Take 81 mg by mouth daily.    . baclofen (LIORESAL) 10 MG tablet Take 1 tablet (10 mg total) by mouth 3 (three) times daily. 90 each 3  . benazepril (LOTENSIN) 20 MG tablet TAKE 1 TABLET (20 MG TOTAL) BY MOUTH DAILY. 30 tablet 5  . diltiazem (CARDIZEM CD) 180 MG 24 hr capsule Take 1 capsule (180 mg total) by mouth daily. 30 capsule 3  . furosemide (LASIX) 20 MG tablet TAKE 1 TABLET (20 MG TOTAL) BY MOUTH DAILY. 30 tablet 5  . Multiple Vitamin (MULTIVITAMIN) tablet Take 1 tablet by mouth daily.     No current facility-administered medications for this encounter.    No Known Allergies  Social History   Social History  . Marital Status: Married    Spouse Name: N/A  . Number of Children: N/A  . Years of Education: N/A   Occupational History  . Retired Engineer, site    Social History Main Topics  . Smoking status: Never Smoker   . Smokeless tobacco: Never Used  . Alcohol Use: No  . Drug Use: No  . Sexual Activity: Not on file   Other Topics Concern  .  Not on file   Social History Narrative    Family History  Problem Relation Age of Onset  . Stomach cancer Sister   . Breast cancer Sister   . Heart attack Father   . Hypertension Mother   . Aneurysm Mother     ROS- All systems are reviewed and negative except as per the HPI above  Physical Exam: Filed Vitals:   02/15/15 1341  BP: 142/78  Pulse: 93  Height:  (1.854 m)  Weight: 185 lb (83.915 kg)    GEN- The patient is well appearing, alert and oriented x 3 today.   Head- normocephalic, atraumatic Eyes-  Sclera clear, conjunctiva pink Ears- hearing intact Oropharynx- clear Neck- supple, no JVP Lymph- no cervical lymphadenopathy Lungs- Clear to  ausculation bilaterally, normal work of breathing Heart- Regular rate and rhythm, no murmurs, rubs or gallops, PMI not laterally displaced GI- soft, NT, ND, + BS Extremities- no clubbing, cyanosis, or edema MS- no significant deformity or atrophy Skin- no rash or lesion Psych- euthymic mood, full affect Neuro- strength and sensation are intact  EKG- afib at 93, QRS 92 ms, Qtc 465 ms Dr. Odessa Fleming note reviewed    Assessment and Plan: 1. afib Rate controlled Pt appears symptomatic   2. Chadsvasc score of at least 4 Lengthy discussion with pt discussing stroke risk and bleeding risk and available blood thinners He is very adamant that he does not want to be on a blood thinner period. He said that he is aware of his chances of another stroke and he will take that chance.  3. HTN Stable  4. ILR Continue remote monitoring but pt told me he wanted it removed Can discuss further with Dr. Graciela Husbands  He will f/u with Dr. Graciela Husbands as scheduled and his PCP in March.   Elvina Sidle Matthew Folks [facility] [address] [place], [place] [address] [phone]

## 2015-02-15 NOTE — Addendum Note (Signed)
Encounter addended by: Newman Nip, NP on: 02/15/2015  2:42 PM<BR>     Documentation filed: Follow-up Section, LOS Section

## 2015-02-25 LAB — CUP PACEART REMOTE DEVICE CHECK: Date Time Interrogation Session: 20160913163719

## 2015-02-25 NOTE — Progress Notes (Signed)
Carelink summary report received. Battery status OK. Normal device function. No new symptom episodes, brady, or pause episodes. 4  tachy episodes---all AF w/ RVR. 99.9% AF + ASA only due to financial concern per [facility] note on 01/05/15. Monthly summary reports and ROV w/ Afib clnic 02/15/15 & w/ SK 8/17.

## 2015-02-27 ENCOUNTER — Encounter: Payer: Self-pay | Admitting: Internal Medicine

## 2015-02-27 ENCOUNTER — Other Ambulatory Visit: Payer: Self-pay | Admitting: Internal Medicine

## 2015-02-28 ENCOUNTER — Encounter: Payer: Self-pay | Admitting: Internal Medicine

## 2015-03-02 ENCOUNTER — Ambulatory Visit (INDEPENDENT_AMBULATORY_CARE_PROVIDER_SITE_OTHER): Payer: Medicare Other | Admitting: *Deleted

## 2015-03-02 DIAGNOSIS — I480 Paroxysmal atrial fibrillation: Secondary | ICD-10-CM

## 2015-03-08 ENCOUNTER — Emergency Department (HOSPITAL_COMMUNITY)
Admission: EM | Admit: 2015-03-08 | Discharge: 2015-03-08 | Disposition: A | Discharge status: Other Acute Inpatient Hospital | Payer: Medicare Other | Attending: Emergency Medicine | Admitting: Emergency Medicine

## 2015-03-08 ENCOUNTER — Emergency Department (HOSPITAL_COMMUNITY): Payer: Medicare Other

## 2015-03-08 ENCOUNTER — Encounter (HOSPITAL_COMMUNITY): Payer: Self-pay | Admitting: Emergency Medicine

## 2015-03-08 DIAGNOSIS — R000 Tachycardia, unspecified: Secondary | ICD-10-CM | POA: Insufficient documentation

## 2015-03-08 DIAGNOSIS — R079 Chest pain, unspecified: Secondary | ICD-10-CM | POA: Diagnosis present

## 2015-03-08 DIAGNOSIS — I63311 Cerebral infarction due to thrombosis of right middle cerebral artery: Secondary | ICD-10-CM | POA: Diagnosis not present

## 2015-03-08 DIAGNOSIS — J9601 Acute respiratory failure with hypoxia: Secondary | ICD-10-CM

## 2015-03-08 DIAGNOSIS — Z978 Presence of other specified devices: Secondary | ICD-10-CM

## 2015-03-08 DIAGNOSIS — Z7982 Long term (current) use of aspirin: Secondary | ICD-10-CM | POA: Insufficient documentation

## 2015-03-08 DIAGNOSIS — Z79899 Other long term (current) drug therapy: Secondary | ICD-10-CM | POA: Diagnosis not present

## 2015-03-08 DIAGNOSIS — I63511 Cerebral infarction due to unspecified occlusion or stenosis of right middle cerebral artery: Secondary | ICD-10-CM

## 2015-03-08 DIAGNOSIS — Z8781 Personal history of (healed) traumatic fracture: Secondary | ICD-10-CM | POA: Insufficient documentation

## 2015-03-08 DIAGNOSIS — G8194 Hemiplegia, unspecified affecting left nondominant side: Secondary | ICD-10-CM | POA: Diagnosis not present

## 2015-03-08 DIAGNOSIS — I63411 Cerebral infarction due to embolism of right middle cerebral artery: Secondary | ICD-10-CM | POA: Insufficient documentation

## 2015-03-08 DIAGNOSIS — E785 Hyperlipidemia, unspecified: Secondary | ICD-10-CM | POA: Diagnosis not present

## 2015-03-08 DIAGNOSIS — I639 Cerebral infarction, unspecified: Secondary | ICD-10-CM

## 2015-03-08 DIAGNOSIS — Z8619 Personal history of other infectious and parasitic diseases: Secondary | ICD-10-CM | POA: Insufficient documentation

## 2015-03-08 DIAGNOSIS — I10 Essential (primary) hypertension: Secondary | ICD-10-CM | POA: Insufficient documentation

## 2015-03-08 LAB — DIFFERENTIAL
BASOS ABS: 0.1 10*3/uL (ref 0.0–0.1)
Band Neutrophils: 0 %
Basophils Relative: 1 %
Blasts: 0 %
EOS ABS: 0.3 10*3/uL (ref 0.0–0.7)
Eosinophils Relative: 2 %
LYMPHS ABS: 6 10*3/uL — AB (ref 0.7–4.0)
LYMPHS PCT: 44 %
MONO ABS: 0.5 10*3/uL (ref 0.1–1.0)
MONOS PCT: 4 %
Metamyelocytes Relative: 0 %
Myelocytes: 0 %
NRBC: 0 /100{WBCs}
Neutro Abs: 6.8 10*3/uL (ref 1.7–7.7)
Neutrophils Relative %: 49 %
OTHER: 0 %
Promyelocytes Absolute: 0 %

## 2015-03-08 LAB — I-STAT CHEM 8, ED
BUN: 19 mg/dL (ref 6–20)
CHLORIDE: 107 mmol/L (ref 101–111)
Calcium, Ion: 1.19 mmol/L (ref 1.13–1.30)
Creatinine, Ser: 1.6 mg/dL — ABNORMAL HIGH (ref 0.61–1.24)
GLUCOSE: 164 mg/dL — AB (ref 65–99)
HCT: 54 % — ABNORMAL HIGH (ref 39.0–52.0)
Hemoglobin: 18.4 g/dL — ABNORMAL HIGH (ref 13.0–17.0)
POTASSIUM: 3.6 mmol/L (ref 3.5–5.1)
Sodium: 143 mmol/L (ref 135–145)
TCO2: 23 mmol/L (ref 0–100)

## 2015-03-08 LAB — COMPREHENSIVE METABOLIC PANEL
ALBUMIN: 3.6 g/dL (ref 3.5–5.0)
ALK PHOS: 67 U/L (ref 38–126)
ALT: 19 U/L (ref 17–63)
AST: 24 U/L (ref 15–41)
Anion gap: 12 (ref 5–15)
BUN: 16 mg/dL (ref 6–20)
CALCIUM: 9.5 mg/dL (ref 8.9–10.3)
CO2: 23 mmol/L (ref 22–32)
CREATININE: 1.69 mg/dL — AB (ref 0.61–1.24)
Chloride: 104 mmol/L (ref 101–111)
GFR calc Af Amer: 44 mL/min — ABNORMAL LOW (ref >60)
GFR calc non Af Amer: 38 mL/min — ABNORMAL LOW (ref >60)
GLUCOSE: 163 mg/dL — AB (ref 65–99)
Potassium: 3.6 mmol/L (ref 3.5–5.1)
SODIUM: 139 mmol/L (ref 135–145)
Total Bilirubin: 0.8 mg/dL (ref 0.3–1.2)
Total Protein: 7 g/dL (ref 6.5–8.1)

## 2015-03-08 LAB — I-STAT TROPONIN, ED: Troponin i, poc: 0.02 ng/mL (ref 0.00–0.08)

## 2015-03-08 LAB — APTT: aPTT: 29 seconds (ref 24–37)

## 2015-03-08 LAB — CBC
HEMATOCRIT: 50 % (ref 39.0–52.0)
Hemoglobin: 17.2 g/dL — ABNORMAL HIGH (ref 13.0–17.0)
MCH: 29.6 pg (ref 26.0–34.0)
MCHC: 34.4 g/dL (ref 30.0–36.0)
MCV: 86.1 fL (ref 78.0–100.0)
PLATELETS: 280 10*3/uL (ref 150–400)
RBC: 5.81 MIL/uL (ref 4.22–5.81)
RDW: 13.8 % (ref 11.5–15.5)
WBC: 13.7 10*3/uL — AB (ref 4.0–10.5)

## 2015-03-08 LAB — I-STAT ARTERIAL BLOOD GAS, ED
Acid-Base Excess: 1 mmol/L (ref 0.0–2.0)
Bicarbonate: 24.3 mEq/L — ABNORMAL HIGH (ref 20.0–24.0)
O2 SAT: 100 %
PCO2 ART: 35 mmHg (ref 35.0–45.0)
PH ART: 7.449 (ref 7.350–7.450)
Patient temperature: 98.6
TCO2: 25 mmol/L (ref 0–100)
pO2, Arterial: 231 mmHg — ABNORMAL HIGH (ref 80.0–100.0)

## 2015-03-08 LAB — CBG MONITORING, ED: GLUCOSE-CAPILLARY: 159 mg/dL — AB (ref 65–99)

## 2015-03-08 LAB — PROTIME-INR
INR: 1.07 (ref 0.00–1.49)
Prothrombin Time: 14.1 seconds (ref 11.6–15.2)

## 2015-03-08 MED ORDER — IOHEXOL 350 MG/ML SOLN
50.0000 mL | Freq: Once | INTRAVENOUS | Status: AC | PRN
  Administered 2015-03-08: 50 mL via INTRAVENOUS

## 2015-03-08 MED ORDER — ROCURONIUM BROMIDE 50 MG/5ML IV SOLN
INTRAVENOUS | Status: AC
  Filled 2015-03-08: qty 2

## 2015-03-08 MED ORDER — PROPOFOL 1000 MG/100ML IV EMUL
5.0000 ug/kg/min | Freq: Once | INTRAVENOUS | Status: AC
  Administered 2015-03-08: 10 ug/kg/min via INTRAVENOUS

## 2015-03-08 MED ORDER — ETOMIDATE 2 MG/ML IV SOLN
INTRAVENOUS | Status: AC | PRN
  Administered 2015-03-08: 30 mg via INTRAVENOUS

## 2015-03-08 MED ORDER — LIDOCAINE HCL (CARDIAC) 20 MG/ML IV SOLN
INTRAVENOUS | Status: AC
  Filled 2015-03-08: qty 5

## 2015-03-08 MED ORDER — ETOMIDATE 2 MG/ML IV SOLN
INTRAVENOUS | Status: AC
  Filled 2015-03-08: qty 20

## 2015-03-08 MED ORDER — ALTEPLASE (STROKE) FULL DOSE INFUSION
0.9000 mg/kg | Freq: Once | INTRAVENOUS | Status: AC
  Administered 2015-03-08: 76 mg via INTRAVENOUS
  Filled 2015-03-08: qty 76

## 2015-03-08 MED ORDER — FENTANYL CITRATE (PF) 100 MCG/2ML IJ SOLN
INTRAMUSCULAR | Status: DC
Start: 2015-03-08 — End: 2015-03-08
  Filled 2015-03-08: qty 2

## 2015-03-08 MED ORDER — SUCCINYLCHOLINE CHLORIDE 20 MG/ML IJ SOLN
INTRAMUSCULAR | Status: AC
  Filled 2015-03-08: qty 1

## 2015-03-08 MED ORDER — PROPOFOL 1000 MG/100ML IV EMUL
INTRAVENOUS | Status: AC
  Filled 2015-03-08: qty 100

## 2015-03-08 MED ORDER — ROCURONIUM BROMIDE 50 MG/5ML IV SOLN
INTRAVENOUS | Status: AC | PRN
  Administered 2015-03-08: 100 mg via INTRAVENOUS

## 2015-03-08 MED ORDER — FENTANYL CITRATE (PF) 100 MCG/2ML IJ SOLN
50.0000 ug | Freq: Once | INTRAMUSCULAR | Status: AC
  Administered 2015-03-08: 50 ug via INTRAVENOUS

## 2015-03-08 NOTE — Progress Notes (Signed)
LOOP RECORDER  

## 2015-03-08 NOTE — Progress Notes (Signed)
   03/08/15 1345  Clinical Encounter Type  Visited With Family;Health care provider  Visit Type Initial;Critical Care  Referral From Nurse  Stress Factors  Family Stress Factors None identified   Chaplain responded to a request from the ED to facilitate family receiving medical consultation. Chaplain offered support and is available as needed.   Alda PonderAdam M Lynix Bonine, Chaplain 03/08/2015 1:47 PM
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
## 2015-03-08 NOTE — Progress Notes (Signed)
RT note: Pt. met at bridge in ED, CODE STROKE, staff noted <'d LOC, taken to [facility] where after assessment  by multiple staff was decided that pt. needed airway established prior to CT scan, where it was determined TPA to be given due to window being met.

## 2015-03-08 NOTE — Progress Notes (Signed)
RT note: [facility].
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
## 2015-03-08 NOTE — ED Notes (Signed)
Xray called to obtain stat xray.
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
## 2015-03-10 LAB — CUP PACEART REMOTE DEVICE CHECK: MDC IDC SESS DTM: 20161013163653

## 2015-03-10 NOTE — Progress Notes (Signed)
[facility] summary report received. Battery status OK. Normal device function. No new symptom, brady, or pause episodes. 100% AF burden, +ASA 81mg , patient declines additional A/C per AF clinic note from 02/15/15, avg V rates controlled. 4 tachy episodes--RVR, +diltiazem. Monthly summary reports and ROV with SK in 12/2015.

## 2015-03-24 ENCOUNTER — Encounter: Payer: Self-pay | Admitting: Cardiology

## 2015-03-30 ENCOUNTER — Encounter: Payer: Self-pay | Admitting: Cardiology

## 2015-04-01 ENCOUNTER — Other Ambulatory Visit: Payer: Self-pay | Admitting: Internal Medicine

## 2015-04-03 ENCOUNTER — Encounter: Payer: Self-pay | Admitting: Internal Medicine

## 2015-04-03 ENCOUNTER — Ambulatory Visit (INDEPENDENT_AMBULATORY_CARE_PROVIDER_SITE_OTHER): Payer: Medicare Other | Admitting: *Deleted

## 2015-04-03 DIAGNOSIS — I639 Cerebral infarction, unspecified: Secondary | ICD-10-CM | POA: Diagnosis not present

## 2015-04-03 NOTE — Progress Notes (Signed)
[facility] summary report / loop recorder 

## 2015-04-04 ENCOUNTER — Inpatient Hospital Stay (HOSPITAL_COMMUNITY): Payer: Medicare Other

## 2015-04-04 ENCOUNTER — Emergency Department (HOSPITAL_COMMUNITY): Payer: Medicare Other

## 2015-04-04 ENCOUNTER — Inpatient Hospital Stay (HOSPITAL_COMMUNITY)
Admission: EM | Admit: 2015-04-04 | Discharge: 2015-04-20 | DRG: 871 | Disposition: E | Payer: Medicare Other | Attending: Internal Medicine | Admitting: Internal Medicine

## 2015-04-04 ENCOUNTER — Encounter (HOSPITAL_COMMUNITY): Payer: Self-pay | Admitting: Emergency Medicine

## 2015-04-04 DIAGNOSIS — E860 Dehydration: Secondary | ICD-10-CM | POA: Diagnosis present

## 2015-04-04 DIAGNOSIS — Z79899 Other long term (current) drug therapy: Secondary | ICD-10-CM | POA: Diagnosis not present

## 2015-04-04 DIAGNOSIS — N179 Acute kidney failure, unspecified: Secondary | ICD-10-CM | POA: Diagnosis present

## 2015-04-04 DIAGNOSIS — L8990 Pressure ulcer of unspecified site, unspecified stage: Secondary | ICD-10-CM | POA: Diagnosis present

## 2015-04-04 DIAGNOSIS — Z905 Acquired absence of kidney: Secondary | ICD-10-CM

## 2015-04-04 DIAGNOSIS — Z515 Encounter for palliative care: Secondary | ICD-10-CM | POA: Diagnosis present

## 2015-04-04 DIAGNOSIS — R319 Hematuria, unspecified: Secondary | ICD-10-CM

## 2015-04-04 DIAGNOSIS — R0600 Dyspnea, unspecified: Secondary | ICD-10-CM | POA: Diagnosis present

## 2015-04-04 DIAGNOSIS — Q211 Atrial septal defect: Secondary | ICD-10-CM | POA: Diagnosis not present

## 2015-04-04 DIAGNOSIS — G4733 Obstructive sleep apnea (adult) (pediatric): Secondary | ICD-10-CM | POA: Diagnosis present

## 2015-04-04 DIAGNOSIS — I481 Persistent atrial fibrillation: Secondary | ICD-10-CM

## 2015-04-04 DIAGNOSIS — Z931 Gastrostomy status: Secondary | ICD-10-CM

## 2015-04-04 DIAGNOSIS — R0989 Other specified symptoms and signs involving the circulatory and respiratory systems: Secondary | ICD-10-CM

## 2015-04-04 DIAGNOSIS — Q2112 Patent foramen ovale: Secondary | ICD-10-CM

## 2015-04-04 DIAGNOSIS — Z8673 Personal history of transient ischemic attack (TIA), and cerebral infarction without residual deficits: Secondary | ICD-10-CM | POA: Diagnosis not present

## 2015-04-04 DIAGNOSIS — I5032 Chronic diastolic (congestive) heart failure: Secondary | ICD-10-CM | POA: Diagnosis present

## 2015-04-04 DIAGNOSIS — I63139 Cerebral infarction due to embolism of unspecified carotid artery: Secondary | ICD-10-CM | POA: Diagnosis not present

## 2015-04-04 DIAGNOSIS — R1310 Dysphagia, unspecified: Secondary | ICD-10-CM | POA: Diagnosis present

## 2015-04-04 DIAGNOSIS — E871 Hypo-osmolality and hyponatremia: Secondary | ICD-10-CM | POA: Diagnosis present

## 2015-04-04 DIAGNOSIS — N319 Neuromuscular dysfunction of bladder, unspecified: Secondary | ICD-10-CM | POA: Diagnosis present

## 2015-04-04 DIAGNOSIS — I10 Essential (primary) hypertension: Secondary | ICD-10-CM | POA: Diagnosis present

## 2015-04-04 DIAGNOSIS — Z22322 Carrier or suspected carrier of Methicillin resistant Staphylococcus aureus: Secondary | ICD-10-CM | POA: Diagnosis not present

## 2015-04-04 DIAGNOSIS — J690 Pneumonitis due to inhalation of food and vomit: Secondary | ICD-10-CM | POA: Diagnosis present

## 2015-04-04 DIAGNOSIS — N183 Chronic kidney disease, stage 3 (moderate): Secondary | ICD-10-CM | POA: Diagnosis present

## 2015-04-04 DIAGNOSIS — I130 Hypertensive heart and chronic kidney disease with heart failure and stage 1 through stage 4 chronic kidney disease, or unspecified chronic kidney disease: Secondary | ICD-10-CM | POA: Diagnosis present

## 2015-04-04 DIAGNOSIS — R4701 Aphasia: Secondary | ICD-10-CM | POA: Diagnosis present

## 2015-04-04 DIAGNOSIS — I4891 Unspecified atrial fibrillation: Secondary | ICD-10-CM | POA: Diagnosis present

## 2015-04-04 DIAGNOSIS — A419 Sepsis, unspecified organism: Principal | ICD-10-CM | POA: Diagnosis present

## 2015-04-04 DIAGNOSIS — R0682 Tachypnea, not elsewhere classified: Secondary | ICD-10-CM

## 2015-04-04 DIAGNOSIS — J189 Pneumonia, unspecified organism: Secondary | ICD-10-CM | POA: Diagnosis present

## 2015-04-04 DIAGNOSIS — G114 Hereditary spastic paraplegia: Secondary | ICD-10-CM | POA: Diagnosis present

## 2015-04-04 DIAGNOSIS — Z66 Do not resuscitate: Secondary | ICD-10-CM | POA: Diagnosis present

## 2015-04-04 DIAGNOSIS — L89151 Pressure ulcer of sacral region, stage 1: Secondary | ICD-10-CM | POA: Diagnosis present

## 2015-04-04 DIAGNOSIS — J9621 Acute and chronic respiratory failure with hypoxia: Secondary | ICD-10-CM | POA: Diagnosis not present

## 2015-04-04 DIAGNOSIS — Z8249 Family history of ischemic heart disease and other diseases of the circulatory system: Secondary | ICD-10-CM

## 2015-04-04 DIAGNOSIS — Z7982 Long term (current) use of aspirin: Secondary | ICD-10-CM

## 2015-04-04 DIAGNOSIS — E870 Hyperosmolality and hypernatremia: Secondary | ICD-10-CM | POA: Diagnosis present

## 2015-04-04 DIAGNOSIS — I639 Cerebral infarction, unspecified: Secondary | ICD-10-CM

## 2015-04-04 DIAGNOSIS — D649 Anemia, unspecified: Secondary | ICD-10-CM | POA: Diagnosis present

## 2015-04-04 DIAGNOSIS — I4819 Other persistent atrial fibrillation: Secondary | ICD-10-CM

## 2015-04-04 DIAGNOSIS — G9340 Encephalopathy, unspecified: Secondary | ICD-10-CM | POA: Diagnosis present

## 2015-04-04 DIAGNOSIS — I350 Nonrheumatic aortic (valve) stenosis: Secondary | ICD-10-CM | POA: Diagnosis present

## 2015-04-04 DIAGNOSIS — N390 Urinary tract infection, site not specified: Secondary | ICD-10-CM

## 2015-04-04 DIAGNOSIS — E785 Hyperlipidemia, unspecified: Secondary | ICD-10-CM | POA: Diagnosis present

## 2015-04-04 DIAGNOSIS — K117 Disturbances of salivary secretion: Secondary | ICD-10-CM | POA: Diagnosis present

## 2015-04-04 DIAGNOSIS — I6340 Cerebral infarction due to embolism of unspecified cerebral artery: Secondary | ICD-10-CM | POA: Diagnosis present

## 2015-04-04 HISTORY — DX: Chronic kidney disease, stage 3 (moderate): N18.3

## 2015-04-04 HISTORY — DX: Unspecified atrial fibrillation: I48.91

## 2015-04-04 HISTORY — DX: Chronic kidney disease, stage 3 unspecified: N18.30

## 2015-04-04 LAB — PROCALCITONIN: Procalcitonin: 45.93 ng/mL

## 2015-04-04 LAB — BLOOD GAS, ARTERIAL
Acid-base deficit: 7.1 mmol/L — ABNORMAL HIGH (ref 0.0–2.0)
BICARBONATE: 17 meq/L — AB (ref 20.0–24.0)
DRAWN BY: 312971
FIO2: 100
O2 SAT: 88.7 %
PO2 ART: 65.9 mmHg — AB (ref 80.0–100.0)
Patient temperature: 102.5
TCO2: 17.9 mmol/L (ref 0–100)
pCO2 arterial: 32.6 mmHg — ABNORMAL LOW (ref 35.0–45.0)
pH, Arterial: 7.35 (ref 7.350–7.450)

## 2015-04-04 LAB — BASIC METABOLIC PANEL
ANION GAP: 6 (ref 5–15)
BUN: 39 mg/dL — ABNORMAL HIGH (ref 6–20)
CHLORIDE: 114 mmol/L — AB (ref 101–111)
CO2: 23 mmol/L (ref 22–32)
Calcium: 8.1 mg/dL — ABNORMAL LOW (ref 8.9–10.3)
Creatinine, Ser: 1.6 mg/dL — ABNORMAL HIGH (ref 0.61–1.24)
GFR, EST AFRICAN AMERICAN: 47 mL/min — AB (ref >60)
GFR, EST NON AFRICAN AMERICAN: 41 mL/min — AB (ref >60)
Glucose, Bld: 261 mg/dL — ABNORMAL HIGH (ref 65–99)
POTASSIUM: 4.4 mmol/L (ref 3.5–5.1)
SODIUM: 143 mmol/L (ref 135–145)

## 2015-04-04 LAB — CBC WITH DIFFERENTIAL/PLATELET
BASOS ABS: 0 10*3/uL (ref 0.0–0.1)
BASOS PCT: 0 %
Basophils Absolute: 0 10*3/uL (ref 0.0–0.1)
Basophils Relative: 0 %
EOS ABS: 0 10*3/uL (ref 0.0–0.7)
EOS PCT: 1 %
Eosinophils Absolute: 0.2 10*3/uL (ref 0.0–0.7)
Eosinophils Relative: 0 %
HCT: 35.3 % — ABNORMAL LOW (ref 39.0–52.0)
HCT: 42.4 % (ref 39.0–52.0)
Hemoglobin: 10.7 g/dL — ABNORMAL LOW (ref 13.0–17.0)
Hemoglobin: 13.3 g/dL (ref 13.0–17.0)
LYMPHS ABS: 0.6 10*3/uL — AB (ref 0.7–4.0)
Lymphocytes Relative: 3 %
Lymphocytes Relative: 7 %
Lymphs Abs: 1.5 10*3/uL (ref 0.7–4.0)
MCH: 28.9 pg (ref 26.0–34.0)
MCH: 30 pg (ref 26.0–34.0)
MCHC: 30.3 g/dL (ref 30.0–36.0)
MCHC: 31.4 g/dL (ref 30.0–36.0)
MCV: 95.4 fL (ref 78.0–100.0)
MCV: 95.5 fL (ref 78.0–100.0)
MONO ABS: 0.4 10*3/uL (ref 0.1–1.0)
MONO ABS: 0.7 10*3/uL (ref 0.1–1.0)
MONOS PCT: 2 %
MONOS PCT: 4 %
NEUTROS ABS: 17.8 10*3/uL — AB (ref 1.7–7.7)
Neutro Abs: 17.7 10*3/uL — ABNORMAL HIGH (ref 1.7–7.7)
Neutrophils Relative %: 89 %
Neutrophils Relative %: 94 %
PLATELETS: 215 10*3/uL (ref 150–400)
Platelets: 256 10*3/uL (ref 150–400)
RBC: 3.7 MIL/uL — AB (ref 4.22–5.81)
RBC: 4.44 MIL/uL (ref 4.22–5.81)
RDW: 15.4 % (ref 11.5–15.5)
RDW: 15.8 % — AB (ref 11.5–15.5)
WBC Morphology: INCREASED
WBC: 19 10*3/uL — AB (ref 4.0–10.5)
WBC: 19.9 10*3/uL — ABNORMAL HIGH (ref 4.0–10.5)

## 2015-04-04 LAB — COMPREHENSIVE METABOLIC PANEL
ALBUMIN: 2.4 g/dL — AB (ref 3.5–5.0)
ALK PHOS: 65 U/L (ref 38–126)
ALT: 37 U/L (ref 17–63)
AST: 32 U/L (ref 15–41)
Anion gap: 12 (ref 5–15)
BUN: 45 mg/dL — ABNORMAL HIGH (ref 6–20)
CALCIUM: 9.5 mg/dL (ref 8.9–10.3)
CO2: 25 mmol/L (ref 22–32)
CREATININE: 1.98 mg/dL — AB (ref 0.61–1.24)
Chloride: 106 mmol/L (ref 101–111)
GFR calc Af Amer: 37 mL/min — ABNORMAL LOW (ref >60)
GFR calc non Af Amer: 32 mL/min — ABNORMAL LOW (ref >60)
GLUCOSE: 294 mg/dL — AB (ref 65–99)
Potassium: 5 mmol/L (ref 3.5–5.1)
SODIUM: 143 mmol/L (ref 135–145)
Total Bilirubin: 1.1 mg/dL (ref 0.3–1.2)
Total Protein: 5.9 g/dL — ABNORMAL LOW (ref 6.5–8.1)

## 2015-04-04 LAB — STREP PNEUMONIAE URINARY ANTIGEN: Strep Pneumo Urinary Antigen: NEGATIVE

## 2015-04-04 LAB — LACTIC ACID, PLASMA
LACTIC ACID, VENOUS: 4.4 mmol/L — AB (ref 0.5–2.0)
Lactic Acid, Venous: 2.6 mmol/L (ref 0.5–2.0)
Lactic Acid, Venous: 2.4 mmol/L (ref 0.5–2.0)
Lactic Acid, Venous: 3.4 mmol/L (ref 0.5–2.0)

## 2015-04-04 LAB — I-STAT ARTERIAL BLOOD GAS, ED
ACID-BASE DEFICIT: 3 mmol/L — AB (ref 0.0–2.0)
BICARBONATE: 21 meq/L (ref 20.0–24.0)
O2 Saturation: 91 %
PH ART: 7.362 (ref 7.350–7.450)
PO2 ART: 69 mmHg — AB (ref 80.0–100.0)
TCO2: 22 mmol/L (ref 0–100)
pCO2 arterial: 37.8 mmHg (ref 35.0–45.0)

## 2015-04-04 LAB — MRSA PCR SCREENING: MRSA BY PCR: POSITIVE — AB

## 2015-04-04 LAB — URINALYSIS, ROUTINE W REFLEX MICROSCOPIC
BILIRUBIN URINE: NEGATIVE
Glucose, UA: NEGATIVE mg/dL
Ketones, ur: 15 mg/dL — AB
Nitrite: NEGATIVE
PROTEIN: 30 mg/dL — AB
SPECIFIC GRAVITY, URINE: 1.028 (ref 1.005–1.030)
UROBILINOGEN UA: 1 mg/dL (ref 0.0–1.0)
pH: 5 (ref 5.0–8.0)

## 2015-04-04 LAB — GLUCOSE, CAPILLARY
Glucose-Capillary: 147 mg/dL — ABNORMAL HIGH (ref 65–99)
Glucose-Capillary: 134 mg/dL — ABNORMAL HIGH (ref 65–99)

## 2015-04-04 LAB — GRAM STAIN

## 2015-04-04 LAB — AMMONIA: AMMONIA: 25 umol/L (ref 9–35)

## 2015-04-04 LAB — URINE MICROSCOPIC-ADD ON

## 2015-04-04 LAB — PROTIME-INR
INR: 2.1 — ABNORMAL HIGH (ref 0.00–1.49)
PROTHROMBIN TIME: 23.4 s — AB (ref 11.6–15.2)

## 2015-04-04 LAB — INFLUENZA PANEL BY PCR (TYPE A & B)
H1N1 flu by pcr: NOT DETECTED
INFLAPCR: NEGATIVE
INFLBPCR: NEGATIVE

## 2015-04-04 LAB — I-STAT CG4 LACTIC ACID, ED: LACTIC ACID, VENOUS: 4.85 mmol/L — AB (ref 0.5–2.0)

## 2015-04-04 LAB — BRAIN NATRIURETIC PEPTIDE: B Natriuretic Peptide: 667.7 pg/mL — ABNORMAL HIGH (ref 0.0–100.0)

## 2015-04-04 LAB — I-STAT TROPONIN, ED: Troponin i, poc: 0.04 ng/mL (ref 0.00–0.08)

## 2015-04-04 LAB — APTT: aPTT: 42 seconds — ABNORMAL HIGH (ref 24–37)

## 2015-04-04 LAB — CREATININE, URINE, RANDOM: CREATININE, URINE: 185.12 mg/dL

## 2015-04-04 MED ORDER — OSMOLITE 1.2 CAL PO LIQD
1000.0000 mL | ORAL | Status: DC
  Administered 2015-04-05: 1000 mL
  Filled 2015-04-04 (×2): qty 1000

## 2015-04-04 MED ORDER — SODIUM CHLORIDE 0.9 % IV BOLUS (SEPSIS)
1000.0000 mL | Freq: Once | INTRAVENOUS | Status: AC
Start: 2015-04-04 — End: 2015-04-04
  Administered 2015-04-04: 1000 mL via INTRAVENOUS

## 2015-04-04 MED ORDER — POLYMYXIN B-TRIMETHOPRIM 10000-0.1 UNIT/ML-% OP SOLN
1.0000 [drp] | OPHTHALMIC | Status: DC
  Administered 2015-04-04 – 2015-04-08 (×24): 1 [drp] via OPHTHALMIC
  Filled 2015-04-04 (×3): qty 10

## 2015-04-04 MED ORDER — IBUPROFEN 400 MG PO TABS: 600.0000 mg | ORAL_TABLET | Freq: Once | ORAL | Status: DC

## 2015-04-04 MED ORDER — LACTULOSE 10 GM/15ML PO SOLN
20.0000 g | Freq: Every day | ORAL | Status: DC
  Administered 2015-04-04: 20 g via ORAL
  Filled 2015-04-04 (×2): qty 30

## 2015-04-04 MED ORDER — ACETAMINOPHEN 160 MG/5ML PO SOLN
325.0000 mg | Freq: Once | ORAL | Status: AC
  Administered 2015-04-04: 325 mg
  Filled 2015-04-04: qty 20.3

## 2015-04-04 MED ORDER — VANCOMYCIN HCL 10 G IV SOLR
1500.0000 mg | Freq: Once | INTRAVENOUS | Status: AC
  Administered 2015-04-04: 1500 mg via INTRAVENOUS
  Filled 2015-04-04: qty 1500

## 2015-04-04 MED ORDER — IPRATROPIUM-ALBUTEROL 0.5-2.5 (3) MG/3ML IN SOLN
3.0000 mL | RESPIRATORY_TRACT | Status: DC
  Administered 2015-04-04 – 2015-04-05 (×7): 3 mL via RESPIRATORY_TRACT
  Filled 2015-04-04 (×8): qty 3

## 2015-04-04 MED ORDER — PRAVASTATIN SODIUM 40 MG PO TABS
40.0000 mg | ORAL_TABLET | Freq: Every day | ORAL | Status: DC
  Administered 2015-04-04 – 2015-04-05 (×2): 40 mg
  Filled 2015-04-04 (×3): qty 1

## 2015-04-04 MED ORDER — ACETAMINOPHEN 650 MG RE SUPP
650.0000 mg | Freq: Four times a day (QID) | RECTAL | Status: DC | PRN
  Administered 2015-04-04 (×2): 650 mg via RECTAL
  Filled 2015-04-04 (×2): qty 1

## 2015-04-04 MED ORDER — GUAIFENESIN ER 600 MG PO TB12: 600.0000 mg | ORAL_TABLET | Freq: Two times a day (BID) | ORAL | Status: DC

## 2015-04-04 MED ORDER — VANCOMYCIN HCL 10 G IV SOLR
1250.0000 mg | INTRAVENOUS | Status: DC
  Administered 2015-04-05 – 2015-04-06 (×2): 1250 mg via INTRAVENOUS
  Filled 2015-04-04 (×2): qty 1250

## 2015-04-04 MED ORDER — SODIUM CHLORIDE 0.9 % IV BOLUS (SEPSIS)
1000.0000 mL | Freq: Once | INTRAVENOUS | Status: AC
  Administered 2015-04-04: 1000 mL via INTRAVENOUS

## 2015-04-04 MED ORDER — DILTIAZEM HCL 60 MG PO TABS
90.0000 mg | ORAL_TABLET | Freq: Four times a day (QID) | ORAL | Status: DC
  Administered 2015-04-04 – 2015-04-05 (×4): 90 mg via ORAL
  Filled 2015-04-04 (×12): qty 1

## 2015-04-04 MED ORDER — ONDANSETRON HCL 4 MG/2ML IJ SOLN: 4.0000 mg | Freq: Four times a day (QID) | INTRAMUSCULAR | Status: DC | PRN

## 2015-04-04 MED ORDER — MUPIROCIN 2 % EX OINT
1.0000 "application " | TOPICAL_OINTMENT | Freq: Two times a day (BID) | CUTANEOUS | Status: DC
  Administered 2015-04-04 – 2015-04-08 (×9): 1 via NASAL
  Filled 2015-04-04 (×3): qty 22

## 2015-04-04 MED ORDER — GUAIFENESIN 100 MG/5ML PO SOLN
15.0000 mL | Freq: Four times a day (QID) | ORAL | Status: DC
  Administered 2015-04-04 – 2015-04-06 (×8): 300 mg
  Filled 2015-04-04 (×13): qty 15

## 2015-04-04 MED ORDER — ACETAMINOPHEN 325 MG PO TABS: 650.0000 mg | ORAL_TABLET | Freq: Four times a day (QID) | ORAL | Status: DC | PRN

## 2015-04-04 MED ORDER — PIPERACILLIN-TAZOBACTAM 3.375 G IVPB
3.3750 g | Freq: Three times a day (TID) | INTRAVENOUS | Status: DC
  Administered 2015-04-04 – 2015-04-06 (×7): 3.375 g via INTRAVENOUS
  Filled 2015-04-04 (×8): qty 50

## 2015-04-04 MED ORDER — SODIUM CHLORIDE 0.9 % IV SOLN
1000.0000 mL | INTRAVENOUS | Status: DC
Start: 2015-04-04 — End: 2015-04-04
  Administered 2015-04-04 (×2): 1000 mL via INTRAVENOUS

## 2015-04-04 MED ORDER — ONDANSETRON HCL 4 MG PO TABS: 4.0000 mg | ORAL_TABLET | Freq: Four times a day (QID) | ORAL | Status: DC | PRN

## 2015-04-04 MED ORDER — VANCOMYCIN HCL IN DEXTROSE 1-5 GM/200ML-% IV SOLN
1000.0000 mg | Freq: Once | INTRAVENOUS | Status: DC
Start: 2015-04-04 — End: 2015-04-04
  Filled 2015-04-04: qty 200

## 2015-04-04 MED ORDER — LEVALBUTEROL HCL 0.63 MG/3ML IN NEBU
0.6300 mg | INHALATION_SOLUTION | Freq: Four times a day (QID) | RESPIRATORY_TRACT | Status: DC | PRN
Start: 2015-04-04 — End: 2015-04-05
  Filled 2015-04-04: qty 3

## 2015-04-04 MED ORDER — PIPERACILLIN-TAZOBACTAM 3.375 G IVPB 30 MIN
3.3750 g | Freq: Once | INTRAVENOUS | Status: AC
  Administered 2015-04-04: 3.375 g via INTRAVENOUS
  Filled 2015-04-04: qty 50

## 2015-04-04 MED ORDER — BACLOFEN 10 MG PO TABS
10.0000 mg | ORAL_TABLET | Freq: Three times a day (TID) | ORAL | Status: DC
  Administered 2015-04-04 – 2015-04-06 (×7): 10 mg
  Filled 2015-04-04 (×10): qty 1

## 2015-04-04 MED ORDER — SODIUM CHLORIDE 0.9 % IJ SOLN
3.0000 mL | Freq: Two times a day (BID) | INTRAMUSCULAR | Status: DC
  Administered 2015-04-04 – 2015-04-07 (×9): 3 mL via INTRAVENOUS

## 2015-04-04 MED ORDER — APIXABAN 5 MG PO TABS
5.0000 mg | ORAL_TABLET | Freq: Two times a day (BID) | ORAL | Status: DC
  Administered 2015-04-04 – 2015-04-06 (×5): 5 mg
  Filled 2015-04-04 (×7): qty 1

## 2015-04-04 MED ORDER — SODIUM CHLORIDE 0.9 % IV BOLUS (SEPSIS)
1000.0000 mL | INTRAVENOUS | Status: AC
  Administered 2015-04-04 (×2): 1000 mL via INTRAVENOUS

## 2015-04-04 MED ORDER — ARTIFICIAL TEARS OP OINT
1.0000 "application " | TOPICAL_OINTMENT | Freq: Three times a day (TID) | OPHTHALMIC | Status: DC
  Administered 2015-04-04 – 2015-04-08 (×12): 1 via OPHTHALMIC
  Filled 2015-04-04 (×2): qty 3.5

## 2015-04-04 MED ORDER — SODIUM CHLORIDE 0.9 % IV BOLUS (SEPSIS)
500.0000 mL | INTRAVENOUS | Status: AC
  Administered 2015-04-04: 500 mL via INTRAVENOUS

## 2015-04-04 MED ORDER — VANCOMYCIN HCL IN DEXTROSE 1-5 GM/200ML-% IV SOLN
1000.0000 mg | INTRAVENOUS | Status: DC
  Filled 2015-04-04: qty 200

## 2015-04-04 MED ORDER — SODIUM CHLORIDE 0.9 % IV SOLN
1000.0000 mL | INTRAVENOUS | Status: DC
Start: 2015-04-04 — End: 2015-04-05
  Administered 2015-04-05 (×2): 1000 mL via INTRAVENOUS

## 2015-04-04 MED ORDER — ISOSOURCE HN PO LIQD: 75.0000 mL | ORAL | Status: DC

## 2015-04-04 MED ORDER — CHLORHEXIDINE GLUCONATE CLOTH 2 % EX PADS
6.0000 | MEDICATED_PAD | Freq: Every day | CUTANEOUS | Status: AC
  Administered 2015-04-04 – 2015-04-08 (×5): 6 via TOPICAL

## 2015-04-04 MED ORDER — INSULIN DETEMIR 100 UNIT/ML ~~LOC~~ SOLN
14.0000 [IU] | Freq: Every day | SUBCUTANEOUS | Status: DC
  Filled 2015-04-04 (×2): qty 0.14

## 2015-04-04 MED ORDER — SODIUM CHLORIDE 0.9 % IV SOLN
1000.0000 mL | INTRAVENOUS | Status: DC
  Administered 2015-04-04: 1000 mL via INTRAVENOUS

## 2015-04-04 MED ORDER — PIPERACILLIN-TAZOBACTAM 3.375 G IVPB 30 MIN: 3.3750 g | Freq: Three times a day (TID) | INTRAVENOUS | Status: DC

## 2015-04-04 MED ORDER — PAROXETINE HCL 20 MG PO TABS
40.0000 mg | ORAL_TABLET | Freq: Every day | ORAL | Status: DC
  Administered 2015-04-04 – 2015-04-06 (×3): 40 mg
  Filled 2015-04-04 (×4): qty 2

## 2015-04-04 NOTE — Progress Notes (Signed)
OT Cancellation Note  Patient Details Name: Abdulaziz W Lycan MRN: [id number] DOB: [birth year]   Cancelled Treatment:    Reason Eval/Treat Not Completed: Patient not medically ready.  Will check back.   Angelene GiovanniConarpe, Valmai [place] M  Meeah Totino [place], OTR/L [phone]  2014/12/26, 3:15 PM

## 2015-04-04 NOTE — Progress Notes (Signed)
Pt NT suctioned two passes down right nare and once down the left for copious amounts of thick tan and bloody secretions. ABG drawn, SATS are increasing. RN at bedside.

## 2015-04-04 NOTE — Progress Notes (Signed)
On arrival pt is gurgling and breaths sounds are Rhonchus throughout all lung fields. Has has fever and increase WOB. Pt is maintaining his o2 saturations at 95% on a NRB. Pt was NTS got back small amount of yellow tan thick secretions. Pt was stable throughout suctioning. No complications noted. Family at bedside. MD at bedside.

## 2015-04-04 NOTE — Progress Notes (Signed)
abg collected  

## 2015-04-04 NOTE — Progress Notes (Signed)
Sputum collected per RRT, NTS. Yellow tan secretions

## 2015-04-04 NOTE — Progress Notes (Signed)
Inpatient Diabetes Program Recommendations  AACE/ADA: New Consensus Statement on Inpatient Glycemic Control (2015)  Target Ranges:  Prepandial:   less than 140 mg/dL      Peak postprandial:   less than 180 mg/dL (1-2 hours)      Critically ill patients:  140 - 180 mg/dL    Admit with: Pneumonia  History: CVA, Feeding Tube, CKD  SNF Insulin Orders: Levemir 14 units daily  Current Insulin Orders: Levemir 14 units daily    MD- Note patient has orders for Osmolite tube feeds @ 75cc/hour.  Please consider starting Novolog Sensitive SSI (0-9 units) Q4 hours     --Will follow patient during hospitalization--  Ambrose FinlandJeannine Johnston Taniah Reinecke RN, MSN, CDE Diabetes Coordinator Inpatient Glycemic Control Team Team Pager: [phone] (8a-5p)

## 2015-04-04 NOTE — Progress Notes (Signed)
Calvin AblesFrancis K Gorham, Attorney, presented order appointing him Guardian Ad Litem of [employer] [place] on 03/30/2015.  Copy of court order placed in shadow chart.

## 2015-04-04 NOTE — ED Notes (Signed)
Renal US delayed per MD Clyde LundborgNiu; pt not stable enough to leave department without tele

## 2015-04-04 NOTE — Progress Notes (Signed)
PT Cancellation Note  Patient Details Name: Calvin Merritt MRN: [id number] DOB: 1940-05-28   Cancelled Treatment:    Reason Eval/Treat Not Completed: Patient not medically ready.  Pt currently on strict bedrest.  Please advance activity order once appropriate for PT and mobility.     Sunny SchleinRitenour, Zakariya Knickerbocker F, [place] CarolinaPT [phone] 2014/12/06, 8:32 AM

## 2015-04-04 NOTE — Progress Notes (Signed)
RN is handling sputum collections sending to Lab

## 2015-04-04 NOTE — Progress Notes (Signed)
[place] TEAM 1 - Stepdown/ICU TEAM Progress Note  Calvin Merritt:[id number] DOB: [birth year] DOA: 04/14/15 PCP: Myrlene Broker, MD  Admit HPI / Brief Narrative: 74 y.o. WM PMHx HTN, PFO, AV Stenosis, Chronic Diastolic CHF, A-Fib on Eliquis  HLD,  HereditarySpastic Paraparesis, Hep C Antibody positive, CVA (embolic stroke), Rt Parotid Mass,   Presented with fever, shortness of breath.  Patient was recently diagnosed as embolic stroke on 03/08/15. He was treated with tPA. He has a feeding tube placement. He was discharged to the [facility], where he had unsuccessful Interventional Radiological procedure. He was sent here from rehab facility today due to fever and shortness of breath. Patient was noticed to have fever of 104.5. He has severe respiratory distress. He has gurgling sound in his throat per his wife. Pt could not talk and is mentally disorientated since he had stroke. He cannot move his legs due to hx of spastic paraparesis. Patient does not seem to have diarrhea, hematuria, or hematochezia per his wife.  In ED, patient was found to have lactate of 4.85, WBC 19.9, temperature 103.3, tachycardia, worsening renal function. Urinalysis showed small amount of leukocytes. Chest x-ray showed patchy basilar opacity. Patients admitted to inpatient for further eval and treatment.    HPI/Subjective: 11/15 A/O 0, mildly responsive to painful stimuli  Assessment/Plan: Altered mental status -Patient unresponsive to painful stimuli, and previous stroke 02/2015 with findings showing patient to be at high risk for future stroke. MRI brain STAT  Acute on chronic respiratory failure/HCAP vs Aspiration pneumonia -CXR -showed patchy bibasilar opacities, plus leukocytosis and fever, consistent with pneumonia.  -Patient is septic with leukocytosis, fever, tachycardia and elevated lactate and tachypnea. Hemodynamically stable.  - IV Vancomycin and Zosyn - Mucinex for cough  -  DuoNeb, Xopenex Neb prn for SOB - Urine legionella and S. pneumococcal antigen - Follow up blood culture x2, sputum culture, plus Flu pcr - will get Procalcitonin and trend lactic acid level per sepsis protocol - IVF: 2.5 L of NS bolus in ED, followed by 100 mL per hour of NS -NTS BID -Echocardiogram pending  Possible UTI:  -Urinalysis showed small amount of leukocytes. -IV antibiotics as above  Essential hypertension: -hold losartan due to worsening renal function -Hold  Lasix due to sepsis -On Cardizem  HLD:  -Last LDL was 107 on 01/01/14 -Continue home medications: Pravastatin  Cerebral embolism with cerebral infarction [facility]): -on Eliquis -on tube feeding -nutrition consult   Atrial Fibrillation:  -CHA2DS2-VASc Score is 4 needs oral anticoagulation. Patient is on Eliquis at home. 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 Likely due to prerenal secondary to dehydration and continuation of ARB, diruetics - Check FeUrea - US-renal - Follow up renal function by BMP - Avoid lasix and Lotensin  Goals of care: I discussed with his wife and daughter about the goal of care. Patient has multiple severe comorbidities, has very poor prognosis. It is very likely that the patient may have recurrent aspiration pneumonia. Family needs more time to think about the goal of care. May need to discuss further with family. -Palliative care consult placed   Code Status: DO NOT RESUSCITATE Family Communication: no family present at time of exam Disposition Plan: SNF vs Hospice    Consultants:   Procedure/Significant Events: 11/15 renal ultrasound;No hydronephrosis/ focal abnormality of left kidney.- Surgically absent right kidney. -Moderate prostate gland enlargement   Culture  Antibiotics: Zosyn 11/15>> Vancomycin 11/15>>   DVT prophylaxis: Eliquis   Devices    LINES /  TUBES:      Continuous Infusions: . sodium chloride Stopped (04/07/2015 0418)  . feeding supplement (OSMOLITE 1.2 CAL)    . piperacillin-tazobactam (ZOSYN)  IV    . [START ON ] vancomycin      Objective: VITAL SIGNS: Temp: 102 F (38.9 C) (11/15 0144) Temp Source: Rectal (11/15 0144) BP: 121/65 mmHg (11/15 0630) Pulse Rate: 113 (11/15 0630) SPO2; FIO2:   Intake/Output Summary (Last 24 hours) at 04/18/2015 0730 Last data filed at 03/27/2015 0414  Gross per 24 hour  Intake   2553 ml  Output    250 ml  Net   2303 ml     Exam: General: A/O 0, mildly responsive to painful stimuli, positive acute respiratory distress Eyes: ,negative scleral hemorrhage ENT: Negative Runny nose, negative gingival bleeding, Neck:  Negative scars, masses, torticollis, lymphadenopathy, JVD Lungs: Clear to auscultation bilaterally without wheezes or crackles Cardiovascular: Sinus Tachycardic, regular rhythm without murmur gallop or rub normal S1 and S2 Abdomen:negative abdominal pain, nondistended, positive soft, bowel sounds, no rebound, no ascites, no appreciable mass Extremities: No significant cyanosis, clubbing, or edema bilateral lower extremities Psychiatric:  Unable to assess Neurologic:  Unable to assess    Data Reviewed: Basic Metabolic Panel:  Recent Labs Lab 03/24/2015 0035 04/07/2015 0435  NA 143 143  K 5.0 4.4  CL 106 114*  CO2 25 23  GLUCOSE 294* 261*  BUN 45* 39*  CREATININE 1.98* 1.60*  CALCIUM 9.5 8.1*   Liver Function Tests:  Recent Labs Lab 04/12/2015 0035  AST 32  ALT 37  ALKPHOS 65  BILITOT 1.1  PROT 5.9*  ALBUMIN 2.4*   No results for input(s): LIPASE, AMYLASE in the last 168 hours. No results for input(s): AMMONIA in the last 168 hours. CBC:  Recent Labs Lab 03/30/2015 0035  WBC 19.9*  NEUTROABS 17.7*  HGB 13.3  HCT 42.4  MCV 95.5  PLT 256   Cardiac Enzymes: No results for input(s): CKTOTAL, CKMB, CKMBINDEX, TROPONINI in the last 168  hours. BNP (last 3 results)  Recent Labs  04/19/2015 0234  BNP 667.7*    ProBNP (last 3 results) No results for input(s): PROBNP in the last 8760 hours.  CBG: No results for input(s): GLUCAP in the last 168 hours.  Recent Results (from the past 240 hour(s))  Gram stain     Status: None   Collection Time: 04/09/2015  3:55 AM  Result Value Ref Range Status   Specimen Description TRACHEAL SITE  Final   Special Requests NONE  Final   Gram Stain   Final    ABUNDANT WBC PRESENT,BOTH PMN AND MONONUCLEAR GRAM POSITIVE COCCI IN PAIRS IN CHAINS Gram Stain Report Called to,Read Back By and Verified WithMarcene Corning: C STRAUGHAN RN [phone] 04/10/2015 A BROWNING    Report Status 04/09/2015 FINAL  Final     Studies:  Recent x-ray studies have been reviewed in detail by the Attending Physician  Scheduled Meds:  Scheduled Meds: . apixaban  5 mg Per Tube BID  . artificial tears  1 application Right Eye 3 times per day  . baclofen  10 mg Per Tube TID  . diltiazem  90 mg Oral Q6H  . guaiFENesin  15 mL Per Tube 4 times per day  . insulin detemir  14 Units Subcutaneous Daily  . ipratropium-albuterol  3 mL Nebulization Q4H  . lactulose  20 g Oral  Daily  . PARoxetine  40 mg Per Tube Daily  . pravastatin  40 mg Per Tube QHS  . sodium chloride  3 mL Intravenous Q12H  . trimethoprim-polymyxin b  1 drop Left Eye 6 times per day    Time spent on care of this patient: 40 mins   WOODS, Roselind Messier , MD  [facility] Office  [phone] Pager - [phone]  On-Call/Text Page:      [email or web address]      password [email or web address]  If 7PM-7AM, please contact night-coverage [email or web address] Password [email or web address] 04/09/15, 7:30 AM   LOS: 0 days   Care during the described time interval was provided by me .  I have reviewed this patient's available data, including medical history, events of note, physical examination, and all test results as part of my evaluation. I have personally reviewed and interpreted all radiology  studies.   Carolyne Littles, MD [phone] Pager

## 2015-04-04 NOTE — Progress Notes (Addendum)
CRITICAL VALUE ALERT  Critical value received:  Lactic Acid 3.4  Date of notification:  03/27/2015  Time of notification:  1415  Critical value read back:Yes.    Nurse who received alert:  Villa HerbNicole Layann Bluett RN  MD notified (1st page):  Dr. Joseph ArtWoods  Time of first page: 1420  MD notified (2nd page):  Time of second page:  Responding MD:  Dr. Joseph ArtWoods  Time MD responded: [phone]

## 2015-04-04 NOTE — Progress Notes (Signed)
CRITICAL VALUE ALERT  Critical value received: Lactic Acid 4.4  Date of notification:  19-Aug-2014  Time of notification:  1610  Critical value read back:Yes.    Nurse who received alert:  Villa HerbNicole Shamarcus Hoheisel RN  MD notified (1st page):  Dr. Joseph ArtWoods   Time of first page:  1615  MD notified (2nd page):  Time of second page:  Responding MD:  Dr. Joseph ArtWoods  Time MD responded:  [phone]

## 2015-04-04 NOTE — ED Notes (Signed)
Per EMS, [facility], with an oral temp og 104.5, given 650 mg tylenol at facility. Pt given 250 ml NS via IV en route. BP- 87/60, P- 110-150, CBG 317. Pt has hx of Afib, CVA

## 2015-04-04 NOTE — Progress Notes (Signed)
ANTIBIOTIC CONSULT NOTE - INITIAL  Pharmacy Consult for Vancomycin and Zosyn  Indication: pneumonia  No Known Allergies  Patient Measurements: Height: 5\' 10"  (177.8 cm) Weight: 185 lb (83.915 kg) IBW/kg (Calculated) : 73  Vital Signs: Temp: 102 F (38.9 C) (11/15 0144) Temp Source: Rectal (11/15 0144) BP: 129/70 mmHg (11/15 0350) Pulse Rate: 56 (11/15 0350) Intake/Output from previous day: 11/14 0701 - 11/15 0700 In: 2550 [I.V.:2500] Out: 250 [Urine:250] Intake/Output from this shift: Total I/O In: 2550 [I.V.:2500; Other:50] Out: 250 [Urine:250]  Labs:  Recent Labs  Sep 28, 2014 0035  WBC 19.9*  HGB 13.3  PLT 256  CREATININE 1.98*   Estimated Creatinine Clearance: 33.8 mL/min (by C-G formula based on Cr of 1.98). No results for input(s): VANCOTROUGH, VANCOPEAK, VANCORANDOM, GENTTROUGH, GENTPEAK, GENTRANDOM, TOBRATROUGH, TOBRAPEAK, TOBRARND, AMIKACINPEAK, AMIKACINTROU, AMIKACIN in the last 72 hours.   Microbiology: No results found for this or any previous visit (from the past 720 hour(s)).  Medical History: Past Medical History  Diagnosis Date  . Hyperlipidemia   . Hypertension   . Spastic paraparesis (HCC)   . 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 Acetabular fracture (HCC)     left  . Diverticulosis   . PFO (patent foramen ovale) 01/05/2014  . 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 Aortic valve stenosis, moderate 01/05/2014  . Atrial fibrillation (HCC)   . CKD (chronic kidney disease), stage III     Medications:  Baclofen  Eliquis  Cardizem  Levemir  Lactulose  Paxil  KCL  Pravachol    Assessment: 74 y.o. male with fever/SOB, likely HCAP, for empiric antibiotics  Vancomycin 1500 mg IV given in ED at 130   Goal of Therapy:  Vancomycin trough level 15-20 mcg/ml  Plan:  Vancomycin 1000 mg IV q24h Zosyn 3.375 g IV q8h   Brantley Wiley, Gary FleetGregory  Vernon 08/25/14,3:56 AM

## 2015-04-04 NOTE — H&P (Addendum)
[facility] History and Physical  Calvin Merritt JXB:[id number] DOB: Jun 23, 1940 DOA: 04-24-15  Referring physician: ED physician PCP: Myrlene BrokerElizabeth A Crawford, MD  Specialists:   Chief Complaint: Fever and shortness of breath  HPI: Calvin Merritt is a 74 y.o. male with PMH of recent embolic stroke, dysphagia, feeding tube placement, dwelling Foley cath, hypertension, hereditary spastic paraparesis, HCV, PFO, atrial fibrillation on Eliquis, CKD-III, R parotid mass, who presented with fever, shortness of breath.  Patient was recently diagnosed as embolic stroke on 03/08/15. He was treated with tPA. He has a feeding tube placement. [facility], where he had unsuccessful Interventional Radiological procedure. He was sent here from rehab facility today due to fever and shortness of breath. Patient was noticed to have fever of 104.5. He has severe respiratory distress. He has gurgling sound in his throat per his wife. Pt could not talk and is mentally disorientated since he had stroke. He cannot move his legs due to hx of spastic paraparesis. Patient does not seem to have diarrhea, hematuria, or hematochezia per his wife.  In ED, patient was found to have lactate of 4.85, WBC 19.9, temperature 103.3, tachycardia, worsening renal function. Urinalysis showed small amount of leukocytes. Chest x-ray showed patchy basilar opacity. Patients admitted to inpatient for further eval and treatment.   Where does patient live?    SNF   Can patient participate in ADLs?  None   Review of Systems:  cannot be reviewed due to altered mental status.   Allergy: No Known Allergies  Past Medical History  Diagnosis Date  . Hyperlipidemia   . Hypertension   . Spastic paraparesis (HCC)   . 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 Acetabular fracture (HCC)     left  . Diverticulosis   . PFO (patent foramen ovale) 01/05/2014  . Cerebral embolism with cerebral infarction (HCC)  12/31/2013     Patchy left MCA and  right parietal, embolic, source unknown, s/p partial dose IV tPA   . Aortic valve stenosis, moderate 01/05/2014  . Atrial fibrillation (HCC)   . CKD (chronic kidney disease), stage III     Past Surgical History  Procedure Laterality Date  . Nephrectomy Right 11/05  . Hip surgery  05/11    L-acetabular fracture  . Tee without cardioversion N/A 01/04/2014    Procedure: TRANSESOPHAGEAL ECHOCARDIOGRAM (TEE);  Surgeon: Laurey Moralealton S McLean, MD;  [facility];  Service: Cardiovascular;  Laterality: N/A;  . Loop recorder implant N/A 01/05/2014    Procedure: LOOP RECORDER IMPLANT;  Surgeon: Duke SalviaSteven C Klein, MD;  [facility];  Service: Cardiovascular;  Laterality: N/A;    Social History:  reports that he has never smoked. He has never used smokeless tobacco. He reports that he does not drink alcohol or use illicit drugs.  Family History:  Family History  Problem Relation Age of Onset  . Stomach cancer Sister   . Breast cancer Sister   . Heart attack Father   . Hypertension Mother   . Aneurysm Mother      Prior to Admission medications   Medication Sig Start Date End Date Taking? Authorizing Provider  aspirin 81 MG tablet Take 81 mg by mouth daily.    Historical Provider, MD  baclofen (LIORESAL) 10 MG tablet TAKE 1 TABLET (10 MG TOTAL) BY MOUTH 3 (THREE) TIMES DAILY. 02/27/15   Myrlene BrokerElizabeth A Crawford, MD  benazepril (LOTENSIN) 20 MG tablet TAKE 1 TABLET (20 MG TOTAL)  BY MOUTH DAILY. 02/27/15   Myrlene Broker, MD  diltiazem (CARDIZEM CD) 180 MG 24 hr capsule Take 1 capsule (180 mg total) by mouth daily. 01/06/15   Duke Salvia, MD  furosemide (LASIX) 20 MG tablet TAKE 1 TABLET (20 MG TOTAL) BY MOUTH DAILY. 09/09/14   Nyoka Cowden, MD  Multiple Vitamin (MULTIVITAMIN) tablet Take 1 tablet by mouth daily.    Historical Provider, MD    Physical Exam: Filed Vitals:   03/25/2015 0200 03/30/2015 0210 03/23/2015 0230 04/19/2015 0300  BP: 152/75  121/78 161/76 128/65  Pulse: 73 121 71 66  Temp:      TempSrc:      Resp: Height:      Weight:      SpO2: 86% 91% 92% 90%   General: Not in acute distress HEENT:       Eyes: PERRL, EOMI, no scleral icterus.       ENT: No discharge from the ears and nose, no pharynx injection, no tonsillar enlargement.        Neck: No JVD, no bruit, no mass felt. Heme: No neck lymph node enlargement. Cardiac: S1/S2, RRR, No murmurs, No gallops or rubs. Pulm: Diffused rales and rhonchi bilaterally. Abd: Soft, nondistended, nontender, no organomegaly, BS present. Has feeding tube placed. Ext: No pitting leg edema bilaterally. 2+DP/PT pulse bilaterally. Musculoskeletal: No joint deformities, No joint redness or warmth, no limitation of ROM in spin. Skin: No rashes.  Neuro: can not talk, dioriented X3, cranial nerves II-XII grossly intact, has spastic paraparesis. Psych: Patient is not psychotic, no suicidal or hemocidal ideation.  Labs on Admission:  Basic Metabolic Panel:  Recent Labs Lab 04/09/2015 0035  NA 143  K 5.0  CL 106  CO2 25  GLUCOSE 294*  BUN 45*  CREATININE 1.98*  CALCIUM 9.5   Liver Function Tests:  Recent Labs Lab 03/25/2015 0035  AST 32  ALT 37  ALKPHOS 65  BILITOT 1.1  PROT 5.9*  ALBUMIN 2.4*   No results for input(s): LIPASE, AMYLASE in the last 168 hours. No results for input(s): AMMONIA in the last 168 hours. CBC:  Recent Labs Lab 03/31/2015 0035  WBC 19.9*  NEUTROABS 17.7*  HGB 13.3  HCT 42.4  MCV 95.5  PLT 256   Cardiac Enzymes: No results for input(s): CKTOTAL, CKMB, CKMBINDEX, TROPONINI in the last 168 hours.  BNP (last 3 results)  Recent Labs  04/07/2015 0234  BNP 667.7*    ProBNP (last 3 results) No results for input(s): PROBNP in the last 8760 hours.  CBG: No results for input(s): GLUCAP in the last 168 hours.  Radiological Exams on Admission: Dg Chest Portable 1 View  04/09/2015  CLINICAL DATA:  Initial evaluation for  possible sepsis, adventitial lung sounds. EXAM: PORTABLE CHEST 1 VIEW COMPARISON:  Prior radiograph from 03/08/2015 FINDINGS: Cardiomegaly is stable from previous. Mediastinal silhouette within normal limits. Lungs are mildly hypoinflated. There is diffuse vascular congestion without overt pulmonary edema. No pleural effusion. Scattered bibasilar opacities favored to reflect congestion and/or atelectasis, although superimposed infection not entirely excluded. No pneumothorax. No acute osseus abnormality. Degenerative changes present about the shoulders. IMPRESSION: 1. Cardiomegaly with diffuse pulmonary vascular congestion without overt pulmonary edema. 2. Patchy bibasilar opacities, favored to vascular congestion and/or atelectasis. Superimposed infiltrates not excluded, but less favored. Electronically Signed   By: Rise Mu M.D.   On: 04/07/2015 00:59    EKG: Not done in ED, will get one.  Assessment/Plan Principal Problem:   HCAP (healthcare-associated pneumonia) Active Problems:   Hyperlipidemia   Hereditary spastic paraplegia (HCC)   Essential hypertension   Cerebral embolism with cerebral infarction (HCC)   PFO (patent foramen ovale)   Aortic valve stenosis, moderate   Atrial fibrillation (HCC)   Sepsis (HCC)   Acute renal failure superimposed on stage 3 chronic kidney disease (HCC)   Aspiration pneumonia (HCC)   Acute on chronic respiratory failure with hypoxia (HCC)  Acute on chronic respiratory failure due to HCAP versus aspiration pneumonia and sepsis 2/2 PNA: CXR showed patchy bibasilar opacities, plus leukocytosis and fever, consistent with pneumonia. Patient is septic with leukocytosis, fever, tachycardia and elevated lactate and tachypnea. Hemodynamically stable.   - Will admit to TSDU - IV Vancomycin and Zosyn - Mucinex for cough  - DuoNeb, Xopenex Neb prn for SOB - Urine legionella and S. pneumococcal antigen - Follow up blood culture x2, sputum culture, plus  Flu pcr - will get Procalcitonin and trend lactic acid level per sepsis protocol - IVF: 2.5 L of NS bolus in ED, followed by 75 mL per hour of NS  Possible UTI: Urinalysis showed small amount of leukocytes. -IV antibiotics as above  Essential hypertension: -hold losartan due to worsening renal function -Hold her Lasix due to sepsis -On Cardizem  HLD: Last LDL was 107 on 01/01/14 -Continue home medications: Pravastatin  Cerebral embolism with cerebral infarction [facility]): -on Eliquis -on tube feeding -give levemir 14 units daily (patient does not have diabetes). -nutrition consult   Atrial Fibrillation: CHA2DS2-VASc Score is 4 needs oral anticoagulation. Patient is on Eliquis at home. Heart rate is ok, ~110/min on admission  -Continue Eliquis and Cardizem  AoCKD-III: Baseline Cre is 1.60, his Cre is 1.98, BUN 48 on admission. Likely due to prerenal secondary to dehydration and continuation of ARB, diruetics - IVF as above - Check FeUrea - US-renal - Follow up renal function by BMP - Avoid lasix and Lotensin  Goals of care: I discussed with his wife and daughter about the goal of care. Patient has multiple severe comorbidities, has very poor prognosis. It is very likely that the patient may have recurrent aspiration pneumonia. Family needs more time to think about the goal of care. May need to discuss further with family.   DVT ppx: on Eliquis Code Status: DNR Family Communication:   Yes, patient's wife and daughter at bed side Disposition Plan: Admit to inpatient   Date of Service 03/22/2015    Lorretta Harp [facility] Pager [phone]  If 7PM-7AM, please contact night-coverage [email or web address] Password [email or web address] 04/02/2015, 3:36 AM

## 2015-04-04 NOTE — ED Provider Notes (Signed)
By signing my name below, I, Kaitlyn Shelton, attest that this documentation has been prepared under the direction and in the presence of Kayo Zion N Ichelle Harral, DO. Electronically Signed: Lyndel Safe, ED Scribe. . 1:57 AM.  TIME SEEN: 12:53 AM  CHIEF COMPLAINT: Fever  Pt is non-verbal and the history is provided by the Daughter and wife.   HPI:  HPI Comments: Calvin Merritt is a 74 y.o. male, with a PMhx of HLD, HTN, CVA (recent in Oct leading to aphasia), and Afib on ASA, brought in by ambulance, who presents to the Emergency Department complaining of a sudden onset, constant fever with a Tmax of 104.22F PTA. Pt febrile at 103.81F on triage vitals. Per daughter the pt began exhibiting labored breathing with a gurgling sound 1 day ago which is abnormal for his baseline. Pt was placed on 4L O2 at rehab facility due to labored breathing. He has a history of CVA 4 weeks ago and has been non-verbal and unable to follow commands since the stroke, per daughter. He resides at [facility] where a chest Xray was obtained tonight which showed a right-sided infiltrate and he received  tylenol PTA for reported fever other 104.5. Additionally he was given 250 ml NS via EMS IV en route. He has a Foley catheter that was changed 12 days ago. Pt also has a G tube in upper abdomen which he also receives his medication and nutrition through. Daughter also reports known skin break down to sacral region. NKDA.  PCP: Dr. Okey Dupre   ROS: LEVEL 5 CAVEAT: PT NON-VERBAL S/P CVA  PAST MEDICAL HISTORY/PAST SURGICAL HISTORY:  Past Medical History  Diagnosis Date  . Hyperlipidemia   . Hypertension   . Spastic paraparesis (HCC)   . 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 Acetabular fracture (HCC)     left  . Diverticulosis   . PFO (patent foramen ovale) 01/05/2014  . 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 Aortic valve stenosis, moderate 01/05/2014    MEDICATIONS:  Prior to Admission medications   Medication Sig Start Date End Date Taking? Authorizing Provider  aspirin 81 MG tablet Take 81 mg by mouth daily.    Historical Provider, MD  baclofen (LIORESAL) 10 MG tablet TAKE 1 TABLET (10 MG TOTAL) BY MOUTH 3 (THREE) TIMES DAILY. 02/27/15   Myrlene Broker, MD  benazepril (LOTENSIN) 20 MG tablet TAKE 1 TABLET (20 MG TOTAL) BY MOUTH DAILY. 02/27/15   Myrlene Broker, MD  diltiazem (CARDIZEM CD) 180 MG 24 hr capsule Take 1 capsule (180 mg total) by mouth daily. 01/06/15   Duke Salvia, MD  furosemide (LASIX) 20 MG tablet TAKE 1 TABLET (20 MG TOTAL) BY MOUTH DAILY. 09/09/14   Nyoka Cowden, MD  Multiple Vitamin (MULTIVITAMIN) tablet Take 1 tablet by mouth daily.    Historical Provider, MD    ALLERGIES:  No Known Allergies  SOCIAL HISTORY:  Social History  Substance Use Topics  . Smoking status: Never Smoker   . Smokeless tobacco: Never Used  . Alcohol Use: No    FAMILY HISTORY: Family History  Problem Relation Age of Onset  . Stomach cancer Sister   . Breast cancer Sister   . Heart attack Father   . Hypertension MotheLyndel Safesm Mother     EXAM: Temp(Src) 103.3 F (39.6 C) (  Rectal)  Ht 5\' 10"  (1.778 m)  Wt 185 lb (83.915 kg)  BMI 26.54 kg/m2 CONSTITUTIONAL: Pt is elderly, febrile, aphasic, and groaning.  HEAD: Normocephalic EYES: Conjunctivae clear on right. Left conjunctiva is injected with purulent drainage and he cannot fully close this eye which is his baseline. ENT: normal nose; thick white and yellow nasal congestion and a wet cough; moist mucous membranes; pharynx without lesions noted NECK: Supple, no meningismus, no LAD  CARD: tachycardic and irregularly irregular rhythm; S1 and S2 appreciated; no murmurs, no clicks, no rubs, no gallops RESP: Normal chest excursion without splinting; diffuse rhonchorous breath sounds but equal  bilaterally; tachypneic, no wheezes, no rales, no hypoxia or respiratory distress.  ABD/GI: Normal bowel sounds; non-distended; soft, non-tender, no rebound, no guarding, no peritoneal signs, G tube in upper abdomen with no surrounding erythema, warmth, fluctuance or induration, no drainage.  BACK:  The back appears normal and is non-tender to palpation, there is no CVA tenderness GU:  Indwelling Foley catheter in place without associated bleeding or discharge, testicles distended without tenderness or mass, no erythema warmth or crepitus noted, no induration or fluctuance EXT: Normal ROM in all joints; non-tender to palpation; no edema; normal capillary refill; no cyanosis, no calf tenderness or swelling    SKIN: Normal color for age and race; warm; no rash, 2 cm X 2 cm stage 1 sacral decubitus ulcer.  NEURO: Aphasic; unable to follow commands but does move all 4 extremities spontaneously and opens his eyes spontaneously.  PSYCH: unable to evaluate.   MEDICAL DECISION MAKING: Patient here with tachycardia, fever. Normal blood pressure. Meets SIRS criteria. We'll start IV fluids, broad-spectrum antibiotics, obtain labs, cultures, chest x-ray, urine.  ED PROGRESS: Patient appears to have a healthcare associated pneumonia as well as a urinary tract infection. Received vancomycin and Zosyn. Also received IV fluids. Lactate elevated at 4.85. Discussed with Dr. Clyde LundborgNiu with hospitalist service for admission to step down. Family updated.  Discussed with patient's daughter who states the patient will be a DO NOT RESUSCITATE. They do not want chest compressions, defibrillation or intubation. They do agree to admission and medical treatment for his sepsis.    Date: 04/13/2015 00:26  Rate: 129  Rhythm:  Atrial fibrillation  QRS Axis: normal  Intervals: normal  ST/T Wave abnormalities: normal  Conduction Disutrbances: none  Narrative Interpretation: atrial fibrillation     CRITICAL CARE Performed by:  Raelyn NumberWARD, Yarel Rushlow N   Total critical care time: 45 minutes  Critical care time was exclusive of separately billable procedures and treating other patients.  Critical care was necessary to treat or prevent imminent or life-threatening deterioration.  Critical care was time spent personally by me on the following activities: development of treatment plan with patient and/or surrogate as well as nursing, discussions with consultants, evaluation of patient's response to treatment, examination of patient, obtaining history from patient or surrogate, ordering and performing treatments and interventions, ordering and review of laboratory studies, ordering and review of radiographic studies, pulse oximetry and re-evaluation of patient's condition.     I personally performed the services described in this documentation, which was scribed in my presence. The recorded information has been reviewed and is accurate.    Layla MawKristen N Jamarius Saha, DO 04/03/2015 [phone]

## 2015-04-04 NOTE — ED Notes (Signed)
RT at bedside suctioning patient

## 2015-04-04 NOTE — ED Notes (Signed)
Ward, MD at bedside. 

## 2015-04-05 ENCOUNTER — Inpatient Hospital Stay (HOSPITAL_COMMUNITY): Payer: Medicare Other

## 2015-04-05 DIAGNOSIS — L8990 Pressure ulcer of unspecified site, unspecified stage: Secondary | ICD-10-CM | POA: Diagnosis present

## 2015-04-05 DIAGNOSIS — Z515 Encounter for palliative care: Secondary | ICD-10-CM | POA: Diagnosis present

## 2015-04-05 LAB — GLUCOSE, CAPILLARY
Glucose-Capillary: 141 mg/dL — ABNORMAL HIGH (ref 65–99)
Glucose-Capillary: 134 mg/dL — ABNORMAL HIGH (ref 65–99)
Glucose-Capillary: 149 mg/dL — ABNORMAL HIGH (ref 65–99)

## 2015-04-05 LAB — CBC WITH DIFFERENTIAL/PLATELET
Basophils Absolute: 0 10*3/uL (ref 0.0–0.1)
Basophils Relative: 0 %
Eosinophils Absolute: 0 10*3/uL (ref 0.0–0.7)
Eosinophils Relative: 0 %
HCT: 33.8 % — ABNORMAL LOW (ref 39.0–52.0)
Hemoglobin: 10.1 g/dL — ABNORMAL LOW (ref 13.0–17.0)
LYMPHS ABS: 1.4 10*3/uL (ref 0.7–4.0)
Lymphocytes Relative: 7 %
MCH: 28.9 pg (ref 26.0–34.0)
MCHC: 29.9 g/dL — ABNORMAL LOW (ref 30.0–36.0)
MCV: 96.6 fL (ref 78.0–100.0)
MONO ABS: 0.4 10*3/uL (ref 0.1–1.0)
Monocytes Relative: 2 %
NEUTROS ABS: 18.9 10*3/uL — AB (ref 1.7–7.7)
NEUTROS PCT: 91 %
PLATELETS: 200 10*3/uL (ref 150–400)
RBC: 3.5 MIL/uL — ABNORMAL LOW (ref 4.22–5.81)
RDW: 15.8 % — AB (ref 11.5–15.5)
WBC Morphology: INCREASED
WBC: 20.7 10*3/uL — AB (ref 4.0–10.5)

## 2015-04-05 LAB — COMPREHENSIVE METABOLIC PANEL
ALK PHOS: 59 U/L (ref 38–126)
ALT: 27 U/L (ref 17–63)
ANION GAP: 11 (ref 5–15)
AST: 29 U/L (ref 15–41)
Albumin: 1.7 g/dL — ABNORMAL LOW (ref 3.5–5.0)
BUN: 40 mg/dL — ABNORMAL HIGH (ref 6–20)
CALCIUM: 8.3 mg/dL — AB (ref 8.9–10.3)
CHLORIDE: 114 mmol/L — AB (ref 101–111)
CO2: 22 mmol/L (ref 22–32)
CREATININE: 1.93 mg/dL — AB (ref 0.61–1.24)
GFR, EST AFRICAN AMERICAN: 38 mL/min — AB (ref >60)
GFR, EST NON AFRICAN AMERICAN: 33 mL/min — AB (ref >60)
Glucose, Bld: 148 mg/dL — ABNORMAL HIGH (ref 65–99)
Potassium: 4.6 mmol/L (ref 3.5–5.1)
Sodium: 147 mmol/L — ABNORMAL HIGH (ref 135–145)
Total Bilirubin: 0.9 mg/dL (ref 0.3–1.2)
Total Protein: 5.1 g/dL — ABNORMAL LOW (ref 6.5–8.1)

## 2015-04-05 LAB — MAGNESIUM: Magnesium: 2 mg/dL (ref 1.7–2.4)

## 2015-04-05 LAB — LEGIONELLA PNEUMOPHILA SEROGP 1 UR AG: L. pneumophila Serogp 1 Ur Ag: NEGATIVE

## 2015-04-05 LAB — URINE CULTURE: CULTURE: NO GROWTH

## 2015-04-05 LAB — UREA NITROGEN, URINE: UREA NITROGEN UR: 1282 mg/dL

## 2015-04-05 MED ORDER — ONDANSETRON HCL 4 MG PO TABS: 4.0000 mg | ORAL_TABLET | Freq: Four times a day (QID) | ORAL | Status: DC | PRN

## 2015-04-05 MED ORDER — LEVALBUTEROL HCL 0.63 MG/3ML IN NEBU: 0.6300 mg | INHALATION_SOLUTION | RESPIRATORY_TRACT | Status: DC | PRN

## 2015-04-05 MED ORDER — FREE WATER
200.0000 mL | Status: DC
  Administered 2015-04-05 – 2015-04-06 (×7): 200 mL

## 2015-04-05 MED ORDER — VITAL HIGH PROTEIN PO LIQD: 1000.0000 mL | ORAL | Status: DC

## 2015-04-05 MED ORDER — SODIUM CHLORIDE 0.45 % IV SOLN
INTRAVENOUS | Status: DC
  Administered 2015-04-05 – 2015-04-06 (×3): via INTRAVENOUS

## 2015-04-05 MED ORDER — DILTIAZEM 12 MG/ML ORAL SUSPENSION
90.0000 mg | Freq: Four times a day (QID) | ORAL | Status: DC
  Administered 2015-04-05 – 2015-04-06 (×4): 90 mg
  Filled 2015-04-05 (×8): qty 9

## 2015-04-05 MED ORDER — ONDANSETRON HCL 4 MG/2ML IJ SOLN: 4.0000 mg | Freq: Four times a day (QID) | INTRAMUSCULAR | Status: DC | PRN

## 2015-04-05 MED ORDER — ACETAMINOPHEN 650 MG RE SUPP: 650.0000 mg | Freq: Four times a day (QID) | RECTAL | Status: DC | PRN

## 2015-04-05 MED ORDER — LACTULOSE 10 GM/15ML PO SOLN
20.0000 g | Freq: Every day | ORAL | Status: DC
  Administered 2015-04-05 – 2015-04-06 (×2): 20 g
  Filled 2015-04-05 (×2): qty 30

## 2015-04-05 MED ORDER — SODIUM CHLORIDE 0.45 % IV BOLUS
1000.0000 mL | Freq: Once | INTRAVENOUS | Status: AC
  Administered 2015-04-05: 1000 mL via INTRAVENOUS

## 2015-04-05 MED ORDER — SODIUM CHLORIDE 0.45 % IV BOLUS
500.0000 mL | Freq: Once | INTRAVENOUS | Status: AC
  Administered 2015-04-05: 500 mL via INTRAVENOUS

## 2015-04-05 MED ORDER — ACETAMINOPHEN 325 MG PO TABS: 650.0000 mg | ORAL_TABLET | Freq: Four times a day (QID) | ORAL | Status: DC | PRN

## 2015-04-05 NOTE — Progress Notes (Signed)
NT suctioned pt with copious amounts of pink tinged, tan, very thick secretions. No apparent complications, RT will continue to monitor

## 2015-04-05 NOTE — Progress Notes (Signed)
Pt came down to MRI for scan.  Pt was on several IV's a non re breather mask and a heart monitor with no nurse.  Called the floor to ask about nursing coverage and were told one was not necessary.  We called one of the IR nurses here in dept and she came to evaluate the patient and stated that pt pulse was high and BP was low and our nurse could not stay and we did not feel comfortable doing scan as pt has to be taken off floor monitor in order to scan patient.  Our IR nurse called the floor nurse and it was decided to take patient back up to floor for patient care and safety.

## 2015-04-05 NOTE — Progress Notes (Addendum)
Initial Nutrition Assessment  DOCUMENTATION CODES:   Obesity unspecified  INTERVENTION:   Initiate Osmolite 1.2 formula at 25 ml/hr and increase by 10 ml every 4 hours to goal rate of 75 ml/hr  TF regimen to provide 2160 kcals, 100 gm protein, 1476 ml of free water  NUTRITION DIAGNOSIS:   Inadequate oral intake related to inability to eat as evidenced by NPO status  GOAL:   Patient will meet greater than or equal to 90% of their needs  MONITOR:   TF tolerance, Labs, Weight trends, Skin, I & O's  REASON FOR ASSESSMENT:   Other (Comment), Low Braden (New Tube Feeding)  ASSESSMENT:   74 y.o. Male with PMH of recent embolic stroke, dysphagia, feeding tube placement, dwelling Foley cath, hypertension, hereditary spastic paraparesis, HCV, PFO, atrial fibrillation on Eliquis, CKD-III, R parotid mass, who presented with fever, shortness of breath.   [facility].  Spoke with RN via telephone.  Pt typically receives Isosource HN formula at 75 ml/hr via PEG tube which provides 2160 kcals, 97 gm protein, 1454 ml of free water. Does not take anything by mouth.  Current ordered TF regimen: Osmolite 1.2 formula at goal rate of 75 ml/hr.  Free water flushes at 200 ml every 4 hours.  RD unable to complete Nutrition Focused Physical Exam at this time.  Telephone with readback order received per Dr. Sharon SellerMcClung to add Adult Tube Feeding Protocol.  Diet Order:  Diet NPO time specified  Skin:  Wound (see comment) (Stage II to sacrum)  Last BM:  11/15  Height:   Ht Readings from Last 1 Encounters:  04/12/2015 6\' 2"  (1.88 m)    Weight:   Wt Readings from Last 1 Encounters:  04/03/2015 183 lb 9.6 oz (83.28 kg)    Ideal Body Weight:  86.3 kg  BMI:  Body mass index is 23.56 kg/(m^2).  Estimated Nutritional Needs:   Kcal:  2000-2200  Protein:  100-110 gm  Fluid:  2.0-2.2 L  EDUCATION NEEDS:   No education needs identified at this time  Maureen ChattersKatie  Donalee Gaumond, RD, LDN Pager #: [phone] After-Hours Pager #: [phone]

## 2015-04-05 NOTE — Evaluation (Signed)
Occupational Therapy Evaluation Patient Details Name: Calvin Merritt MRN: [id number] DOB: 12/14/1940 Today's Date:     History of Present Illness 74 yo M Hx HTN, PFO, AV Stenosis, Chronic Diastolic CHF, A-Fib on Eliquis HLD,Hereditary Spastic Paraparesis, Hep C Antibody positive, CVA, and a R Parotid Mass who presented with a fever reportedly to 104.5 and shortness of breath. 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 He had a feeding tube placed. Pt can not talk and has been mentally disorientated since his stroke.    Clinical Impression   Pt admitted with above. He demonstrates the below listed deficits and will benefit from continued OT to maximize safety and independence with BADLs.  Pt does not follow commands, or attempt to engage in activity or interact with environment during OT eval.  Opens Lt eye only to sternal rub, does not track and does not blink to threat.  Spontaneous, non purposeful movement noted of Rt UE only.   Currently total A with all ADLs and mobility.  Will follow for a trial of OT to see if pt able to participate.  Recommend SNF.      Follow Up Recommendations  SNF    Equipment Recommendations  None recommended by OT    Recommendations for Other Services       Precautions / Restrictions Precautions Precautions: Fall Restrictions Weight Bearing Restrictions: No      Mobility Bed Mobility Overal bed mobility: Needs Assistance Bed Mobility: Rolling Rolling: Total assist   Supine to sit: Total assist;+2 for physical assistance Sit to supine: Total assist;+2 for physical assistance   General bed mobility comments: No attempt to assist   Transfers                 General transfer comment: unable     Balance Overall balance assessment: Needs assistance Sitting-balance support: Bilateral upper extremity supported;Feet supported Sitting balance-Leahy Scale: Zero Sitting balance - Comments: Pt sat  EOB 8 minutes with varying assist from total to mod most of time with posterior lean and right.  Did have a period of time towards end of sitting where he sat for seconds with min guard assist.  Did attempt to unweight left hip and improve midline posture which helped pt sit more upright.  Pt was using UEs to try and right self with some initiation of movement but could not sustain movement.  Postural control: Posterior lean;Right lateral lean                                  ADL Overall ADL's : Needs assistance/impaired Eating/Feeding: NPO   Grooming: Total assistance   Upper Body Bathing: Total assistance   Lower Body Bathing: Total assistance   Upper Body Dressing : Total assistance   Lower Body Dressing: Total assistance   Toilet Transfer: Total assistance   Toileting- Clothing Manipulation and Hygiene: Total assistance       Functional mobility during ADLs: Total assistance;+2 for physical assistance General ADL Comments: Pt unable to engage in any functional activity.  Total A for all from bed level.  Unable to safely attempt transfers      Vision Additional Comments: Openes Lt eye only.  Does not blink to threat or track    Perception     Praxis      Pertinent Vitals/Pain Pain Assessment: Faces Faces Pain Scale: No hurt Pain Location: generalized with movement Pain  Descriptors / Indicators: Grimacing;Guarding Pain Intervention(s): Limited activity within patient's tolerance;Monitored during session;Repositioned     Hand Dominance Right   Extremity/Trunk Assessment Upper Extremity Assessment Upper Extremity Assessment: RUE deficits/detail;LUE deficits/detail RUE Deficits / Details: Spontaneous movement Rt UE noted  LUE Deficits / Details: No spontaneous movement noted Lt UE.  Edema noted.  PROM [facility]    Lower Extremity Assessment Lower Extremity Assessment: Defer to PT evaluation RLE Deficits / Details: Tone noted, unable to fully assess due to  tone and pt not follorwing commands.   LLE Deficits / Details: tone noted, unable to fully assess due to tone and pt not following commands   Cervical / Trunk Assessment Cervical / Trunk Assessment: Kyphotic   Communication Communication Communication: Receptive difficulties;Expressive difficulties (Pt not following commands, or attempting to talk)   Cognition Arousal/Alertness: Lethargic Behavior During Therapy: Flat affect Overall Cognitive Status: Impaired/Different from baseline Area of Impairment: Following commands;Safety/judgement;Awareness;Problem solving       Following Commands: Follows one step commands inconsistently Safety/Judgement: Decreased awareness of safety;Decreased awareness of deficits Awareness: Intellectual Problem Solving: Slow processing;Requires verbal cues;Requires tactile cues;Difficulty sequencing General Comments: Pt follows no commands, and does not attempt to engage with environment.  Grimmaced and opened Lt eye to sternal rub, but does not track and no blink to threat.  Does not open Rt eye.  Moving Rt UE spontaneously, but not purposefully    General Comments       Exercises       Shoulder Instructions      Home Living Family/patient expects to be discharged to:: Skilled nursing facility Living Arrangements: Spouse/significant other Available Help at Discharge: Family;Available 24 hours/day Type of Home: House Home Access: Ramped entrance     Home Layout: Two level;Laundry or work area in basement     [employer] Shower/Tub: Producer, television/film/video: Standard Bathroom Accessibility: Yes   Home Equipment: Bedside commode;Shower seat;Toilet riser;Walker - 2 wheels;Walker - 4 wheels;Walker - standard   Additional Comments: Information gleaned per chart review.  Per chart, pt was admitted from [facility] SNF      Prior Functioning/Environment Level of Independence: Needs assistance  Gait / Transfers Assistance Needed: prior to  Oct CVA, pt used wheelchair all of time and would transfer using RW.   ADL's / Homemaking Assistance Needed: supervision to get in shower, could dress himself   Comments: all of the above was prior to stroke in Oct.  since that time, pt max to total assist.      OT Diagnosis: Generalized weakness;Cognitive deficits;Disturbance of vision;Hemiplegia non-dominant side   OT Problem List: Decreased strength;Decreased range of motion;Decreased activity tolerance;Impaired balance (sitting and/or standing);Impaired vision/perception;Decreased coordination;Decreased cognition;Decreased safety awareness;Decreased knowledge of use of DME or AE;Decreased knowledge of precautions;Cardiopulmonary status limiting activity;Impaired tone;Impaired UE functional use;Increased edema   OT Treatment/Interventions: Self-care/ADL training;Neuromuscular education;DME and/or AE instruction;Therapeutic activities;Cognitive remediation/compensation;Visual/perceptual remediation/compensation;Patient/family education;Balance training;Manual therapy;Splinting    OT Goals(Current goals can be found in the care plan section) Acute Rehab OT Goals Patient Stated Goal:  (unable to state) OT Goal Formulation: Patient unable to participate in goal setting Time For Goal Achievement: 04/19/15 Potential to Achieve Goals: Fair ADL Goals Additional ADL Goal #1: Pt will follow one step command in context of functional activity 25% of time with mod cues Additional ADL Goal #2: Family will be inedpenent with PROM Lt UE Additional ADL Goal #3: Pt will sit EOB x 5 mins with max A +1  OT Frequency: Min 2X/week (trial)  Barriers to D/C: Decreased caregiver support          Co-evaluation              End of Session Equipment Utilized During Treatment: Oxygen Nurse Communication: Mobility status  Activity Tolerance: Patient limited by lethargy Patient left: in bed;with call bell/phone within reach;with bed alarm set   Time:  0865-7846 OT Time Calculation (min): 11 min Charges:  OT General Charges $OT Visit: 1 Procedure OT Evaluation $Initial OT Evaluation Tier I: 1 Procedure G-Codes:    Jeani Hawking M 2015-04-06, 11:52 AM

## 2015-04-05 NOTE — Clinical Social Work Note (Signed)
Clinical Social Work Assessment  Patient Details  Name: Calvin Merritt MRN: [id number] Date of Birth: [birth year]  Date of referral:                 Reason for consult:  Facility Placement                Permission sought to share information with:  Family Supports (CSW talked with patient's wife, Pension scheme managerCynthis Merritt as patient disoriented x 4) Permission granted to share information::  No (Verbal permission not given by patient.)  Name::     Calvin Merritt  and Calvin Merritt  Agency::     Relationship::  Wife and son  Contact Information:  wife - [phone] and son - [phone]  Housing/Transportation Living arrangements for the past 2 months:  Sk[facility] Nursing [facility] Source of Information:  Spouse Patient Interpreter Needed:  None Criminal Activity/Legal Involvement Pertinent to Current Situation/Hospitalization:  No - Comment as needed Significant Relationships:  Adult Children, Spouse Lives with:  Facility Resident Do you feel safe going back to the place where you live?  Yes Need for family participation in patient care:  Yes (Comment) (Patient disoriented)  Care giving concerns:  CSW talked by phone with Calvin Merritt regarding discharge plans and she wants to bring patient home, but needs to talk with her son before giving CSW a firm decision.  Social Worker assessment / plan:  CSW talked with wife regarding discharge plans. [facility] but her first choice is to bring patient home from hospital. No firm decision made yet and Calvin Merritt will talk with her son and get back with CSW regarding the discharge plan.   Employment status:  Retired Health and safety inspectornsurance information:  Teacher, English as a foreign languageManaged Medicare - [employer] Medicare PT Recommendations:  Skilled Nursing Facility Information / Referral to community resources:  Skilled Nursing Facility (None needed or requested at this time )  Patient/Family's Response to care:  No  concerns expressed by Calvin Merritt.  Patient/Family's Understanding of and Emotional Response to Diagnosis, Current Treatment, and Prognosis:  Not discussed.  Emotional Assessment Appearance:   (Did not visit with patient as he is disoriented x 4) Attitude/Demeanor/Rapport:  Unable to Assess Affect (typically observed):  Unable to Assess Orientation:   (Patient disoriented) Alcohol / Substance use:  Never Used Psych involvement (Current and /or in the community):  No (Comment)  Discharge Needs  Concerns to be addressed:  Discharge Planning Concerns Readmission within the last 30 days:  No Current discharge risk:  None Barriers to Discharge:  No Barriers Identified   Calvin GoldmannCrawford, Calvin Maute Bradley, LCSW , 12:19 PM

## 2015-04-05 NOTE — Progress Notes (Addendum)
[facility] TEAM 1 - Stepdown/ICU TEAM PROGRESS NOTE  Calvin Merritt RUE:[id number] DOB: 25-Aug-1940 DOA: 04/06/2015 PCP: Myrlene Broker, MD  Admit HPI / Brief Narrative: 74 yo M Hx HTN, PFO, AV Stenosis, Chronic Diastolic CHF, A-Fib on Eliquis HLD,Hereditary Spastic Paraparesis, Hep C Antibody positive, CVA, and a R Parotid Mass who presented with a fever reportedly to 104.5 and shortness of breath.  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 He had a feeding tube placed. Pt can not talk and has been mentally disorientated since his stroke.   In the ED, patient was found to have lactate of 4.85, WBC 19.9, temperature 103.3, tachycardia, worsening renal function. Urinalysis showed small amount of leukocytes. Chest x-ray showed patchy basilar opacity.   HPI/Subjective: The patient is in bed sleeping.  He does not awaken during the exam.  He does not appear to be in acute distress.  There is no family present nor is his court appointed guardian present.  I have taken the time to review the records from his October 2016 admission at [facility].  Assessment/Plan:  Altered mental status -Reportedly the patient has been deemed incompetent at the time of his recent CVA and has a court-appointed guardian - he remains essentially obtunded this morning but does not appear uncomfortable - it is unclear to me if this is different than his baseline, but records do indicate that he is globally aphasic  Sepsis due to HCAP vs Aspiration pneumonia -CXR noted patchy bibasilar opacities w/ leukocytosis and fever, consistent with pneumonia  -Patient is septic with leukocytosis, fever, tachycardia and elevated lactate and tachypnea -IV Vancomycin and Zosyn cont   Acute renal failure on CKD-III (Baseline Cr~1.60) -Cr 1.98, BUN 48 on admission - likely prerenal secondary to dehydration and continuation of ARB - hydrate and follow   Parox Atrial Fibrillation   -CHA2DS2-VASc Score 4 - on Eliquis chronically   CVA - Right MCA thrombus / R temporal and frontal infarct October 2016 -felt to be due to Afib - now on Eliquis  -received tPA at [facility] and was then transferred to [facility] -global aphasia - tube feed dependent - neurogenic bladder -MRI attempted today to r/o acute CVA, but pt developed tachycardia and hypotension requiring return to floor - will address this and consider repeat attempt in AM - given profound deficits from recent CVA, I doubt the pt would be a candidate for any acute interventions at this time if he has indeed had another CVA   Hypernatremia -increase free water and follow   Normocytic anemia -Check iron studies - follow trend  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 rate 17, height  (1.88 m), weight 83.28 kg (183 lb 9.6 oz), SpO2 100 %.  Intake/Output Summary (Last 24 hours) at  0905 Last data filed at  0600  Gross per 24 hour  Intake 2028.08 ml  Output  700 ml  Net 1328.08 ml   Exam: General: No acute respiratory distress but requiring face mask oxygen / mouth breathing  Lungs: Bibasilar crackles - no wheeze Cardiovascular: Regular rate and rhythm - soft systolic murmur  Abdomen: Nontender, nondistended, soft, bowel sounds positive, no rebound, no ascites, no appreciable mass Extremities: No significant cyanosis,  clubbing, or edema bilateral lower extremities  Data Reviewed: Basic Metabolic Panel:  Recent Labs Lab 03/21/2015 0035 03/29/2015 0435  0402  NA 143 143 147*  K 5.0 4.4 4.6  CL 106 114* 114*  CO2 25 23 22   GLUCOSE 294* 261* 148*  BUN 45* 39* 40*  CREATININE 1.98* 1.60* 1.93*  CALCIUM 9.5 8.1* 8.3*  MG  --   --  2.0    CBC:  Recent Labs Lab 04/10/2015 0035 04/01/2015 1902  0402  WBC 19.9* 19.0* 20.7*  NEUTROABS 17.7* 17.8* 18.9*  HGB 13.3 10.7* 10.1*  HCT 42.4 35.3* 33.8*  MCV 95.5 95.4 96.6  PLT 256 215 200    Liver Function Tests:  Recent Labs Lab 04/03/2015 0035  0402  AST 32 29  ALT 37 27  ALKPHOS 65 59  BILITOT 1.1 0.9  PROT 5.9* 5.1*  ALBUMIN 2.4* 1.7*    Recent Labs Lab 04/12/2015 1350  AMMONIA 25   Coags:  Recent Labs Lab 04/02/2015 0439  INR 2.10*    Recent Labs Lab 03/31/2015 0439  APTT 42*   CBG:  Recent Labs Lab 03/25/2015 0914 03/22/2015 1728  GLUCAP 147* 134*    Recent Results (from the past 240 hour(s))  Gram stain     Status: None   Collection Time: 03/21/2015  3:55 AM  Result Value Ref Range Status   Specimen Description TRACHEAL SITE  Final   Special Requests NONE  Final   Gram Stain   Final    ABUNDANT WBC PRESENT,BOTH PMN AND MONONUCLEAR GRAM POSITIVE COCCI IN PAIRS IN CHAINS Gram Stain Report Called to,Read Back By and Verified WithMarcene Corning: C STRAUGHAN RN [phone] 04/06/2015 A BROWNING    Report Status 04/17/2015 FINAL  Final  Culture, respiratory (NON-Expectorated)     Status: None (Preliminary result)   Collection Time: 03/31/2015  3:55 AM  Result Value Ref Range Status   Specimen Description TRACHEAL SITE  Final   Special Requests NONE  Final   Gram Stain   Final    ABUNDANT WBC PRESENT, PREDOMINANTLY PMN RARE SQUAMOUS EPITHELIAL CELLS PRESENT FEW GRAM POSITIVE COCCI IN CLUSTERS Performed at [facility]    Culture   Final    Culture reincubated for better growth Performed at [facility]     Report Status PENDING  Incomplete  MRSA PCR Screening     Status: Abnormal   Collection Time: 04/07/2015  9:17 AM  Result Value Ref Range Status   MRSA by PCR POSITIVE (A) NEGATIVE Final    Comment:        The GeneXpert MRSA Assay (FDA approved for NASAL specimens only), is one component of a comprehensive MRSA colonization surveillance program. It is not intended to diagnose MRSA infection nor to guide or monitor treatment for MRSA infections. RESULT CALLED TO, READ BACK BY AND VERIFIED WITH: Jesus GeneraN. WALLER RN 11:20 03/23/2015 ([email or web address])      Studies:   Recent x-ray studies have been reviewed in detail by the Attending Physician  Scheduled Meds:  Scheduled Meds: . apixaban  5 mg Per Tube BID  . artificial tears  1 application Right Eye 3 times per  day  . baclofen  10 mg Per Tube TID  . Chlorhexidine Gluconate Cloth  6 each Topical Q0600  . diltiazem  90 mg Oral Q6H  . guaiFENesin  15 mL Per Tube 4 times per day  . ipratropium-albuterol  3 mL Nebulization Q4H  . lactulose  20 g Oral Daily  . mupirocin ointment  1 application Nasal BID  . PARoxetine  40 mg Per Tube Daily  . piperacillin-tazobactam (ZOSYN)  IV  3.375 g Intravenous Q8H  . pravastatin  40 mg Per Tube QHS  . sodium chloride  3 mL Intravenous Q12H  . trimethoprim-polymyxin b  1 drop Left Eye 6 times per day  . vancomycin  1,250 mg Intravenous Q24H    Time spent on care of this patient: 35 mins   Ladesha Pacini T , MD   [facility] Office  [phone] Pager - Text Page per Loretha Stapler as per below:  On-Call/Text Page:      [email or web address]      password [email or web address]  If 7PM-7AM, please contact night-coverage [email or web address] Password [email or web address] , 9:05 AM   LOS: 1 day

## 2015-04-05 NOTE — Progress Notes (Signed)
NT suctioned patient with little brownish-bloody secretions returned. RT will continue to monitor.

## 2015-04-05 NOTE — [place] FL2 (Signed)
[place] MEDICAID FL2 LEVEL OF CARE SCREENING TOOL     IDENTIFICATION  Patient Name: Calvin Merritt Birthdate: 1940-09-30 Sex: male Admission Date (Current Location): 04/13/2015  [facility] and [place] Number: Producer, television/film/video and Address:         Provider Number: [id number]  Attending Physician Name and Address:  Lonia Blood, MD  Relative Name and Phone Number:  Herbert Marken, wife  [phone]    Current Level of Care: Hospital Recommended Level of Care: Nursing Facility Prior Approval Number:    Date Approved/Denied:   PASRR Number: [id number] (Effective 10/19/2009)  Discharge Plan: SNF    Current Diagnoses: Patient Active Problem List   Diagnosis Date Noted  . Sepsis (HCC) 04/07/2015  . HCAP (healthcare-associated pneumonia) 03/23/2015  . Acute renal failure superimposed on stage 3 chronic kidney disease (HCC) 04/09/2015  . Aspiration pneumonia (HCC) 04/07/2015  . Acute on chronic respiratory failure with hypoxia (HCC) 03/22/2015  . Atrial fibrillation (HCC)   . Sepsis due to pneumonia (HCC)   . Decreased hearing 01/25/2015  . 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 Depression 01/05/2014  . PFO (patent foramen ovale) 01/05/2014  . Aortic valve stenosis, moderate 01/05/2014  . Cerebral embolism with cerebral infarction (HCC) 12/31/2013  . Health care maintenance 08/21/2013  . HIP FRACTURE, LEFT 11/14/2009  . 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 OBSTRUCTIVE SLEEP APNEA 06/25/2007  . Hyperlipidemia 05/19/2007  . Essential hypertension 05/19/2007    Orientation ACTIVITIES/SOCIAL BLADDER RESPIRATION     (Patient disoriented x4)  Passive Continent, External catheter (Patient has a urethral catheter placed before admission to hospital) Normal (Patient's respiratory pattern is labored)  BEHAVIORAL SYMPTOMS/MOOD NEUROLOGICAL BOWEL NUTRITION STATUS      Continent Diet (Patient currently NPO)  PHYSICIAN VISITS COMMUNICATION OF NEEDS Height &  Weight Skin    Verbally   183 lbs. PU Stage and Appropriate Care   PU Stage 2 Dressing:  (Stage 2 pressure ulcer to mid sacrum)      AMBULATORY STATUS RESPIRATION    Assist extensive (Physical therapist unable to ambulate patient during evaluation on 11/16) Normal (Patient's respiratory pattern is labored)      Personal Care Assistance Level of Assistance  Bathing, Feeding, Dressing Bathing Assistance: Maximum assistance Feeding assistance: Limited assistance Dressing Assistance: Maximum assistance      Functional Limitations Info  Sight Sight Info: Impaired (Patient opens left eye only)           SPECIAL CARE FACTORS FREQUENCY  PT (By licensed PT), OT (By licensed OT)     PT Frequency: Evaluated 11/16. Recommended therapy a minimum of 2x per week OT Frequency: Evaluated 11/16. Recommended therapy a minimum of 2x per week           Additional Factors Info  Code Status, Allergies Code Status Info: DNR Allergies Info: No known allergies           Current Medications (): Current Facility-Administered Medications  Medication Dose Route Frequency Provider Last Rate Last Dose  . 0.45 % sodium chloride infusion   Intravenous Continuous Lonia Blood, MD 100 mL/hr at  1145    . acetaminophen (TYLENOL) tablet 650 mg  650 mg Per Tube Q6H PRN Lonia Blood, MD       Or  . acetaminophen (TYLENOL) suppository 650 mg  650 mg Rectal Q6H PRN Lonia Blood, MD      . apixaban Everlene Balls) tablet 5 mg  5 mg Per Tube BID Lorretta Harp, MD  5 mg at  1143  . artificial tears (LACRILUBE) ophthalmic ointment 1 application  1 application Right Eye 3 times per day Layla MawKristen N Ward, DO   1 application at  0518  . baclofen (LIORESAL) tablet 10 mg  10 mg Per Tube TID Lorretta HarpXilin Niu, MD   10 mg at  1144  . Chlorhexidine Gluconate Cloth 2 % PADS 6 each  6 each Topical Q0600 Drema Dallasurtis J Woods, MD   6 each at  [phone]  . diltiazem (CARDIZEM) 10 mg/ml  oral suspension 90 mg  90 mg Per Tube 4 times per day Lonia BloodJeffrey T McClung, MD      . feeding supplement (OSMOLITE 1.2 CAL) liquid 1,000 mL  1,000 mL Per Tube Continuous Lorretta HarpXilin Niu, MD      . free water 200 mL  200 mL Per Tube 6 times per day Lonia BloodJeffrey T McClung, MD   200 mL at  1149  . guaiFENesin (ROBITUSSIN) 100 MG/5ML solution 300 mg  15 mL Per Tube 4 times per day Lorretta HarpXilin Niu, MD   300 mg at  0517  . lactulose (CHRONULAC) 10 GM/15ML solution 20 g  20 g Per Tube Daily Lonia BloodJeffrey T McClung, MD   20 g at  1143  . levalbuterol (XOPENEX) nebulizer solution 0.63 mg  0.63 mg Nebulization Q3H PRN Lonia BloodJeffrey T McClung, MD      . mupirocin ointment (BACTROBAN) 2 % 1 application  1 application Nasal BID Drema Dallasurtis J Woods, MD   1 application at  1144  . ondansetron (ZOFRAN) tablet 4 mg  4 mg Per Tube Q6H PRN Lonia BloodJeffrey T McClung, MD       Or  . ondansetron [facility](ZOFRAN) injection 4 mg  4 mg Intravenous Q6H PRN Lonia BloodJeffrey T McClung, MD      . PARoxetine (PAXIL) tablet 40 mg  40 mg Per Tube Daily Lorretta HarpXilin Niu, MD   40 mg at  1143  . piperacillin-tazobactam (ZOSYN) IVPB 3.375 g  3.375 g Intravenous Q8H Lorretta HarpXilin Niu, MD 12.5 mL/hr at  0715 3.375 g at  0715  . pravastatin (PRAVACHOL) tablet 40 mg  40 mg Per Tube QHS Lorretta HarpXilin Niu, MD   40 mg at 2014-05-24 2219  . sodium chloride 0.45 % bolus 500 mL  500 mL Intravenous Once Lonia BloodJeffrey T McClung, MD   500 mL at  1148  . sodium chloride 0.9 % injection 3 mL  3 mL Intravenous Q12H Lorretta HarpXilin Niu, MD   3 mL at 2014-05-24 2218  . trimethoprim-polymyxin b (POLYTRIM) ophthalmic solution 1 drop  1 drop Left Eye 6 times per day Layla MawKristen N Ward, DO   1 drop at  0347  . vancomycin (VANCOCIN) 1,250 mg in sodium chloride 0.9 % 250 mL IVPB  1,250 mg Intravenous Q24H Drema Dallasurtis J Woods, MD   1,250 mg at  0522   Do not use this list as official medication orders. Please verify with discharge summary.  Discharge Medications:   Medication List     ASK your doctor about these medications        baclofen 10 MG tablet  Commonly known as:  LIORESAL  TAKE 1 TABLET (10 MG TOTAL) BY MOUTH 3 (THREE) TIMES DAILY.     clotrimazole-betamethasone cream  Commonly known as:  LOTRISONE  Apply 1 application topically 2 (two) times daily.     diltiazem 90 MG tablet  Commonly known as:  CARDIZEM  90 mg by Jejunal Tube route every 6 (six) hours.     ELIQUIS  5 MG Tabs tablet  Generic drug:  apixaban  Take 5 mg by mouth 2 (two) times daily.     insulin detemir 100 UNIT/ML injection  Commonly known as:  LEVEMIR  Inject 14 Units into the skin every morning.     ISOSOURCE HN Liqd  Take 75 mLs by mouth every hour. 7-3 3-11 11-7     lactulose 10 GM/15ML solution  Commonly known as:  CHRONULAC  Take 20 g by mouth daily.     magic mouthwash Soln  Swish and spit 5 mLs every 6 (six) hours.     paroxetine 10 MG/5ML suspension  Commonly known as:  PAXIL  40 mg by Jejunal Tube route every morning.     potassium chloride 20 MEQ/15ML (10%) Soln  40 mEq by Jejunal Tube route daily.     pravastatin 40 MG tablet  Commonly known as:  PRAVACHOL  40 mg by Jejunal Tube route at bedtime.        Relevant Imaging Results:  Relevant Lab Results:  Recent Labs    Additional Information Patient is MRSA PCR positive  Okey Dupre Lazaro Arms, LCSW

## 2015-04-05 NOTE — Consult Note (Signed)
Consultation Note Date:    Patient Name: Calvin Merritt  DOB: 08-Jan-1941  MRN: [id number]  Age / Sex: 74 y.o., male  PCP: Myrlene BrokerElizabeth A Crawford, MD Referring Physician: Lonia BloodJeffrey T McClung, MD  Reason for Consultation: Establishing goals of care    Clinical Assessment/Narrative: Patient is a 74 year old gentleman with a past medical history significant for hypertension diastolic heart failure and atrial fibrillation. Patient had an embolic stroke and was [facility]. Patient remained encephalopathic and underwent PEG tube placement.  Patient has been admitted with sepsis possible healthcare associated versus possible aspiration pneumonia. Further review of the medical chart and upon discussion with the patient's care manager Henrietta, it was noted that the patient's attorney Elwin MochaFrancis gorham at [phone] is the patient's guardian.  I'll placed to patient's Atty. Thelma BargeFrancis. Discussed in detail. Mr. Thelma BargeFrancis states that the patient's son Devona KonigJames Starliper is the petitioner who has applied for the cord to have an interim guardian placed for the patient this might be accomplished as early as 04-12-15. Call placed to son Devona KonigJames Standlee after speaking with Atty. Thelma BargeFrancis, unable to reach son Devona KonigJames Merkel at [phone].  Patient is unresponsive and not able to give any history. He is with nonpurposeful movements of his right upper extremity. He was not able to undergo MRI of the brain today.   Contacts/Participants in Discussion: Patient's provider Elwin MochaFrancis gorham at [phone] Primary Decision Maker: Reported to be patient's attorney Thelma BargeFrancis as well as son Devona KonigJames Groesbeck Relationship to Patient attorney and son HCPOA: It is unknown if the patient has an established healthcare power of attorney agent.   SUMMARY OF RECOMMENDATIONS: Recommend a family meeting between the patient's son Devona KonigJames  Grayer, attorney Elwin MochaFrancis gorham, reportedly patient's ex-spouse Aram BeechamCynthia. Medical recommendations would be for establishment of comfort measures only. Patient likely with a prognosis of days to some very limited number of weeks at this point in time.  Code Status/Advance Care Planning: DNR    Code Status Orders        Start     Ordered   02/24/15 0302  Do not attempt resuscitation (DNR)   Continuous    Question Answer Comment  In the event of cardiac or respiratory ARREST Do not call a "code blue"   In the event of cardiac or respiratory ARREST Do not perform Intubation, CPR, defibrillation or ACLS   In the event of cardiac or respiratory ARREST Use medication by any route, position, wound care, and other measures to relive pain and suffering. May use oxygen, suction and manual treatment of airway obstruction as needed for comfort.      02/24/15 0304      Other Directives:Other  Symptom Management:   Continue current treatment  Palliative Prophylaxis:   Delirium Protocol  Additional Recommendations (Limitations, Scope, Preferences):  Would recommend full Comfort Care    Psycho-social/Spiritual:  Support System: Fair Desire for further Chaplaincy support:no Additional Recommendations: Family meeting  Prognosis: Days to weeks  Discharge Planning: Pending disposition course as well as further discussions/medical decision-making    Chief Complaint/ Primary Diagnoses: Present on Admission:  . Sepsis (HCC) . Hyperlipidemia . Hereditary spastic paraplegia (HCC) . Essential hypertension . Cerebral embolism with cerebral infarction (HCC) . Atrial fibrillation (HCC) . HCAP (healthcare-associated pneumonia) . Acute renal failure superimposed on stage 3 chronic kidney disease (HCC) . Aspiration pneumonia (HCC) . Acute on chronic respiratory failure with hypoxia (HCC)  I have reviewed the medical record, interviewed the patient and family, and examined the  patient. The  following aspects are pertinent.  Past Medical History  Diagnosis Date  . Hyperlipidemia   . Hypertension   . Spastic paraparesis (HCC)   . 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 Acetabular fracture (HCC)     left  . Diverticulosis   . PFO (patent foramen ovale) 01/05/2014  . 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 Aortic valve stenosis, moderate 01/05/2014  . Atrial fibrillation (HCC)   . CKD (chronic kidney disease), stage III    Social History   Social History  . Marital Status: Married    Spouse Name: N/A  . Number of Children: N/A  . Years of Education: N/A   Occupational History  . Retired Engineer, site    Social History Main Topics  . Smoking status: Never Smoker   . Smokeless tobacco: Never Used  . Alcohol Use: No  . Drug Use: No  . Sexual Activity: Not Asked   Other Topics Concern  . None   Social History Narrative   Family History  Problem Relation Age of Onset  . Stomach cancer Sister   . Breast cancer Sister   . Heart attack Father   . Hypertension Mother   . Aneurysm Mother    Scheduled Meds: . apixaban  5 mg Per Tube BID  . artificial tears  1 application Right Eye 3 times per day  . baclofen  10 mg Per Tube TID  . Chlorhexidine Gluconate Cloth  6 each Topical Q0600  . diltiazem  90 mg Per Tube 4 times per day  . free water  200 mL Per Tube 6 times per day  . guaiFENesin  15 mL Per Tube 4 times per day  . lactulose  20 g Per Tube Daily  . mupirocin ointment  1 application Nasal BID  . PARoxetine  40 mg Per Tube Daily  . piperacillin-tazobactam (ZOSYN)  IV  3.375 g Intravenous Q8H  . pravastatin  40 mg Per Tube QHS  . sodium chloride  1,000 mL Intravenous Once  . sodium chloride  3 mL Intravenous Q12H  . trimethoprim-polymyxin b  1 drop Left Eye 6 times per day  . vancomycin  1,250 mg Intravenous Q24H   Continuous Infusions: .  sodium chloride 100 mL/hr at  1145  . feeding supplement (OSMOLITE 1.2 CAL)     PRN Meds:.acetaminophen **OR** acetaminophen, levalbuterol, ondansetron **OR** ondansetron (ZOFRAN) IV Medications Prior to Admission:  Prior to Admission medications   Medication Sig Start Date End Date Taking? Authorizing Provider  apixaban (ELIQUIS) 5 MG TABS tablet Take 5 mg by mouth 2 (two) times daily.   Yes Historical Provider, MD  baclofen (LIORESAL) 10 MG tablet TAKE 1 TABLET (10 MG TOTAL) BY MOUTH 3 (THREE) TIMES DAILY. Patient taking differently: TAKE 1 TABLET (10 MG TOTAL) BY J-TUBE 3 (THREE) TIMES DAILY. 02/27/15  Yes Myrlene Broker, MD  clotrimazole-betamethasone (LOTRISONE) cream Apply 1 application topically 2 (two) times daily.   Yes Historical Provider, MD  diltiazem (CARDIZEM) 90 MG tablet 90 mg by Jejunal Tube route every 6 (six) hours.   Yes Historical Provider, MD  insulin detemir (LEVEMIR) 100 UNIT/ML injection Inject 14 Units into the skin every morning.   Yes Historical Provider, MD  lactulose (CHRONULAC) 10 GM/15ML solution Take 20 g by mouth daily.   Yes Historical Provider, MD  magic mouthwash SOLN  Swish and spit 5 mLs every 6 (six) hours.   Yes Historical Provider, MD  Nutritional Supplements (ISOSOURCE HN) LIQD Take 75 mLs by mouth every hour. 7-3 3-11 11-7   Yes Historical Provider, MD  paroxetine (PAXIL) 10 MG/5ML suspension 40 mg by Jejunal Tube route every morning.   Yes Historical Provider, MD  potassium chloride 20 MEQ/15ML (10%) SOLN 40 mEq by Jejunal Tube route daily.   Yes Historical Provider, MD  pravastatin (PRAVACHOL) 40 MG tablet 40 mg by Jejunal Tube route at bedtime.   Yes Historical Provider, MD   No Known Allergies  Review of Systems Unable to obtain patient nonverbal Physical Exam Elderly appearing gentleman unresponsive Does not follow commands Right upper extremity nonpurposeful movements Doesn't necessarily even respond to sternal rub  Vital  Signs: BP 113/69 mmHg  Pulse 76  Temp(Src) 98.7 F (37.1 C) (Oral)  Resp 25  Ht 6\' 2"  (1.88 m)  Wt 83.28 kg (183 lb 9.6 oz)  BMI 23.56 kg/m2  SpO2 95%  SpO2: SpO2: 95 % O2 Device:SpO2: 95 % O2 Flow Rate: .O2 Flow Rate (L/min): 12 L/min  IO: Intake/output summary:  Intake/Output Summary (Last 24 hours) at  1538 Last data filed at  1359  Gross per 24 hour  Intake 1578.08 ml  Output    700 ml  Net 878.08 ml    LBM: Last BM Date: 04/09/2015 Baseline Weight: Weight: 83.915 kg (185 lb) Most recent weight: Weight: 83.28 kg (183 lb 9.6 oz)      Palliative Assessment/Data:  Flowsheet Rows        Most Recent Value   Intake Tab    Referral Department  Hospitalist   Unit at Time of Referral  ICU   Palliative Care Primary Diagnosis  Neurology   Date Notified     Palliative Care Type  New Palliative care   Date of Admission  04/12/2015   Date first seen by Palliative Care     # of days Palliative referral response time  0 Day(s)   # of days IP prior to Palliative referral  1   Clinical Assessment    Palliative Performance Scale Score  10%   Pain Max last 24 hours  1   Pain Min Last 24 hours  1   Dyspnea Max Last 24 Hours  1   Dyspnea Min Last 24 hours  0   Nausea Max Last 24 Hours  1   Nausea Min Last 24 Hours  0   Anxiety Max Last 24 Hours  1   Anxiety Min Last 24 Hours  1   Other Max Last 24 Hours  1   Psychosocial & Spiritual Assessment    Social Work Plan of Care  Advance care planning   Palliative Care Outcomes    Patient/Family meeting held?  No   Palliative Care Outcomes  Clarified goals of care   Palliative Care follow-up planned  Yes, Facility      Additional Data Reviewed:  CBC:    Component Value Date/Time   WBC 20.7*  0402   HGB 10.1*  0402   HCT 33.8*  0402   PLT 200  0402   MCV 96.6  0402   NEUTROABS 18.9*  0402   LYMPHSABS 1.4  0402   MONOABS 0.4   0402   EOSABS 0.0  0402   BASOSABS 0.0  0402   Comprehensive Metabolic Panel:    Component Value Date/Time   NA 147*  0402  K 4.6  0402   CL 114*  0402   CO2 22  0402   BUN 40*  0402   CREATININE 1.93*  0402   GLUCOSE 148*  0402   CALCIUM 8.3*  0402   AST 29  0402   ALT 27  0402   ALKPHOS 59  0402   BILITOT 0.9  0402   PROT 5.1*  0402   ALBUMIN 1.7*  0402     Time In: 1400 Time Out: 1500 Time Total: 60 Greater than 50%  of this time was spent counseling and coordinating care related to the above assessment and plan.  Signed by: Rosalin Hawking, MD [id number] Rosalin Hawking, MD  , 3:38 PM  Please contact Palliative Medicine Team phone at [phone] for questions and concerns.

## 2015-04-05 NOTE — Evaluation (Signed)
Physical Therapy Evaluation Patient Details Name: Calvin Merritt MRN: [id number] DOB: 1940/10/26 Today's Date:    History of Present Illness  74 yo M Hx HTN, PFO, AV Stenosis, Chronic Diastolic CHF, A-Fib on Eliquis HLD,Hereditary Spastic Paraparesis, Hep C Antibody positive, CVA, and a R Parotid Mass who presented with a fever reportedly to 104.5 and shortness of breath. 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 He had a feeding tube placed. Pt can not talk and has been mentally disorientated since his stroke.   Clinical Impression  Pt admitted with above diagnosis. Pt currently with functional limitations due to the deficits listed below (see PT Problem List). Pt very lethargic needing total assist for bed mobility and sitting EOB.  Pt did attempt to assist with sitting EOB but fatigues quickly needing max to total assist majority of time.  Will follow acutely.   Pt will benefit from skilled PT to increase their independence and safety with mobility to allow discharge to the venue listed below.      Follow Up Recommendations SNF;Supervision/Assistance - 24 hour    Equipment Recommendations  Other (comment) (TBA)    Recommendations for Other Services       Precautions / Restrictions Precautions Precautions: Fall Restrictions Weight Bearing Restrictions: No      Mobility  Bed Mobility Overal bed mobility: Needs Assistance;+2 for physical assistance Bed Mobility: Supine to Sit;Sit to Supine     Supine to sit: Total assist;+2 for physical assistance Sit to supine: Total assist;+2 for physical assistance   General bed mobility comments: Pt did not assist with movement for LEs or trunk.   Transfers                    Ambulation/Gait                Stairs            Wheelchair Mobility    Modified Rankin (Stroke Patients Only)       Balance Overall balance assessment: Needs assistance Sitting-balance  support: Bilateral upper extremity supported;Feet supported Sitting balance-Leahy Scale: Zero Sitting balance - Comments: Pt sat EOB 8 minutes with varying assist from total to mod most of time with posterior lean and right.  Did have a period of time towards end of sitting where he sat for seconds with min guard assist.  Did attempt to unweight left hip and improve midline posture which helped pt sit more upright.  Pt was using UEs to try and right self with some initiation of movement but could not sustain movement.  Postural control: Posterior lean;Right lateral lean                                   Pertinent Vitals/Pain Pain Assessment: Faces Faces Pain Scale: Hurts even more Pain Location: generalized with movement Pain Descriptors / Indicators: Grimacing;Guarding Pain Intervention(s): Limited activity within patient's tolerance;Monitored during session;Repositioned  HR 128-148 bpm, 100% on nonrebreather mask, 112/82.      Home Living Family/patient expects to be discharged to:: Private residence Living Arrangements: Spouse/significant other Available Help at Discharge: Family;Available 24 hours/day Type of Home: House Home Access: Ramped entrance     Home Layout: Two level;Laundry or work area in [employer]: Bedside commode;Shower seat;Toilet riser;Walker - 2 wheels;Walker - 4 wheels;Walker - standard      Prior Function Level of Independence: Independent with  assistive device(s);Needs assistance   Gait / Transfers Assistance Needed: prior to Oct CVA, pt used wheelchair all of time and would transfer using RW.    ADL's / Homemaking Assistance Needed: supervision to get in shower, could dress himself  Comments: all of the above was prior to stroke in Oct.  since that time, pt max to total assist.       Hand Dominance   Dominant Hand: Right    Extremity/Trunk Assessment   Upper Extremity Assessment: Defer to OT evaluation            Lower Extremity Assessment: RLE deficits/detail;LLE deficits/detail RLE Deficits / Details: Tone noted, unable to fully assess due to tone and pt not follorwing commands.   LLE Deficits / Details: tone noted, unable to fully assess due to tone and pt not following commands  Cervical / Trunk Assessment: Kyphotic  Communication   Communication: Expressive difficulties  Cognition Arousal/Alertness: Lethargic;Suspect due to medications Behavior During Therapy: Restless;Flat affect Overall Cognitive Status: Impaired/Different from baseline Area of Impairment: Following commands;Safety/judgement;Awareness;Problem solving       Following Commands: Follows one step commands inconsistently Safety/Judgement: Decreased awareness of safety;Decreased awareness of deficits Awareness: Intellectual Problem Solving: Slow processing;Requires verbal cues;Requires tactile cues;Difficulty sequencing      General Comments      Exercises        Assessment/Plan    PT Assessment Patient needs continued PT services  PT Diagnosis Generalized weakness;Acute pain   PT Problem List Decreased activity tolerance;Decreased balance;Decreased mobility;Decreased strength;Decreased range of motion;Decreased knowledge of use of DME;Decreased safety awareness;Decreased coordination;Decreased cognition;Decreased knowledge of precautions;Cardiopulmonary status limiting activity;Pain;Decreased skin integrity;Impaired tone  PT Treatment Interventions DME instruction;Functional mobility training;Therapeutic activities;Therapeutic exercise;Balance training;Neuromuscular re-education;Cognitive remediation;Patient/family education   PT Goals (Current goals can be found in the Care Plan section) Acute Rehab PT Goals Patient Stated Goal:  (unable to state) PT Goal Formulation: Patient unable to participate in goal setting Time For Goal Achievement: 04/19/15 Potential to Achieve Goals: Fair    Frequency Min 2X/week    Barriers to discharge        Co-evaluation               End of Session Equipment Utilized During Treatment: Gait belt;Oxygen (nonrebreather mask) Activity Tolerance: Patient limited by lethargy;Patient limited by fatigue Patient left: in bed;with call bell/phone within reach;with bed alarm set;with family/visitor present Nurse Communication: Mobility status;Need for lift equipment         Time: [id number] PT Time Calculation (min) (ACUTE ONLY): 31 min   Charges:   PT Evaluation $Initial PT Evaluation Tier I: 1 Procedure PT Treatments $Therapeutic Activity: 8-22 mins   PT G CodesTawni Millers:        Tomeca Helm F , 10:19 AM  Eber Jonesawn Keerthana Vanrossum,PT Acute Rehabilitation [phone])

## 2015-04-06 ENCOUNTER — Encounter: Payer: Self-pay | Admitting: Cardiology

## 2015-04-06 ENCOUNTER — Inpatient Hospital Stay (HOSPITAL_COMMUNITY): Payer: Medicare Other

## 2015-04-06 DIAGNOSIS — K117 Disturbances of salivary secretion: Secondary | ICD-10-CM | POA: Diagnosis present

## 2015-04-06 DIAGNOSIS — R0600 Dyspnea, unspecified: Secondary | ICD-10-CM | POA: Diagnosis present

## 2015-04-06 LAB — CBC
HCT: 30.5 % — ABNORMAL LOW (ref 39.0–52.0)
Hemoglobin: 9.6 g/dL — ABNORMAL LOW (ref 13.0–17.0)
MCH: 29.3 pg (ref 26.0–34.0)
MCHC: 31.5 g/dL (ref 30.0–36.0)
MCV: 93 fL (ref 78.0–100.0)
PLATELETS: 230 10*3/uL (ref 150–400)
RBC: 3.28 MIL/uL — AB (ref 4.22–5.81)
RDW: 15.7 % — ABNORMAL HIGH (ref 11.5–15.5)
WBC: 20.3 10*3/uL — AB (ref 4.0–10.5)

## 2015-04-06 LAB — GLUCOSE, CAPILLARY
GLUCOSE-CAPILLARY: 173 mg/dL — AB (ref 65–99)
GLUCOSE-CAPILLARY: 159 mg/dL — AB (ref 65–99)
Glucose-Capillary: 154 mg/dL — ABNORMAL HIGH (ref 65–99)

## 2015-04-06 LAB — COMPREHENSIVE METABOLIC PANEL
ALK PHOS: 65 U/L (ref 38–126)
ALT: 25 U/L (ref 17–63)
AST: 23 U/L (ref 15–41)
Albumin: 1.6 g/dL — ABNORMAL LOW (ref 3.5–5.0)
Anion gap: 8 (ref 5–15)
BUN: 45 mg/dL — ABNORMAL HIGH (ref 6–20)
CALCIUM: 7.8 mg/dL — AB (ref 8.9–10.3)
CO2: 19 mmol/L — ABNORMAL LOW (ref 22–32)
CREATININE: 1.63 mg/dL — AB (ref 0.61–1.24)
Chloride: 111 mmol/L (ref 101–111)
GFR, EST AFRICAN AMERICAN: 46 mL/min — AB (ref >60)
GFR, EST NON AFRICAN AMERICAN: 40 mL/min — AB (ref >60)
Glucose, Bld: 185 mg/dL — ABNORMAL HIGH (ref 65–99)
Potassium: 3.8 mmol/L (ref 3.5–5.1)
Sodium: 138 mmol/L (ref 135–145)
TOTAL PROTEIN: 5.2 g/dL — AB (ref 6.5–8.1)
Total Bilirubin: 0.7 mg/dL (ref 0.3–1.2)

## 2015-04-06 LAB — AMMONIA: AMMONIA: 26 umol/L (ref 9–35)

## 2015-04-06 MED ORDER — MORPHINE SULFATE (PF) 2 MG/ML IV SOLN
2.0000 mg | INTRAVENOUS | Status: DC | PRN
  Administered 2015-04-06 (×2): 2 mg via INTRAVENOUS
  Filled 2015-04-06 (×2): qty 1

## 2015-04-06 MED ORDER — MORPHINE SULFATE (PF) 2 MG/ML IV SOLN
2.0000 mg | INTRAVENOUS | Status: DC | PRN
  Administered 2015-04-06 – 2015-04-08 (×6): 2 mg via INTRAVENOUS
  Filled 2015-04-06 (×6): qty 1

## 2015-04-06 MED ORDER — FUROSEMIDE 10 MG/ML IJ SOLN
20.0000 mg | INTRAMUSCULAR | Status: AC
  Administered 2015-04-06: 20 mg via INTRAVENOUS
  Filled 2015-04-06: qty 2

## 2015-04-06 MED ORDER — GLYCOPYRROLATE 0.2 MG/ML IJ SOLN
0.4000 mg | Freq: Four times a day (QID) | INTRAMUSCULAR | Status: DC | PRN
  Administered 2015-04-06 – 2015-04-07 (×4): 0.4 mg via INTRAVENOUS
  Filled 2015-04-06 (×5): qty 2

## 2015-04-06 MED ORDER — LORAZEPAM 2 MG/ML IJ SOLN: 1.0000 mg | INTRAMUSCULAR | Status: DC | PRN

## 2015-04-06 NOTE — Progress Notes (Signed)
PT Cancellation Note  Patient Details Name: Ulyses Jarredngus W Licciardi MRN: [id number] DOB: 1940/06/19   Cancelled Treatment:    Reason Eval/Treat Not Completed: Medical issues which prohibited therapy (noted decline in pt status.  Sign off.  )   Berline LopesWhite, Halana Deisher F 04/06/2015, 11:03 AM Eber Jonesawn Jamion Carter,PT Acute Rehabilitation [phone] (pager)

## 2015-04-06 NOTE — Progress Notes (Signed)
Occupational Therapy Discharge Patient Details Name: Calvin Merritt MRN: [id number] DOB: April 12, 1941 Today's Date: 04/06/2015 Time:  -     Patient discharged from OT services secondary to Pt is now full comfort per chart.   No further OT indicated .       Angelene GiovanniConarpe, Myah Guynes M  Havish Petties [place] Park[place]arpe, OTR/L [phone]  04/06/2015, 4:32 PM

## 2015-04-06 NOTE — Progress Notes (Signed)
Pt family has been notified of the decline in the patients condition, wife and daughter are at the bedside and son is en route.  Pt continues to show agonal breathing with respiratory rate in from 30-35 BPM. 2mg  morphine was given as ordered. Will continue to  Monitor.

## 2015-04-06 NOTE — Progress Notes (Signed)
Patients breathing pattern appears to have become more stressed, work of breathing has increased. Rapid response was called to assess, advised to call coverage to assess. NP Craige CottaKirby was paged and asked to come to the room to assess. Will continue to monitor.

## 2015-04-06 NOTE — Progress Notes (Signed)
Daily Progress Note   Patient Name: Calvin Merritt       Date: 04/06/2015 DOB: Dec 01, 1940  Age: 74 y.o. MRN#: [id number] Attending Physician: Drema Dallas, MD Primary Care Physician: Myrlene Broker, MD Admit Date: 04/06/2015  Reason for Consultation/Follow-up: Establishing goals of care, Psychosocial/spiritual support and Terminal Care  Subjective:     - conversation at bedside with family to include; Benard Rink stated wife (some question if patient and her are divorced but all four children endorse her as wife), four children Devona Konig, Burnis Kingfisher, Dena Arabi, and [employer].     - a  detailed discussion was had today regarding advanced directives.  Concepts specific to artifical feeding and hydration, continued IV antibiotics  was had.  The difference between a aggressive medical intervention path  and a palliative comfort care path for this patient at this time was had.  Values and goals of care important to patient and family were attempted to be elicited.  -family report "we know he is dying"   -natural trajectory and expectations at EOL were discussed.  Questions and concerns addressed.   Family encouraged to call with questions or concerns.  PMT will continue to support holistically.  -chaplain support declined  -I spoke to guardian at [facility], Hulen Shouts and he support that family is making decisions for this patient at this time   Length of Stay: 2 days  Current Medications: Scheduled Meds:  . artificial tears  1 application Right Eye 3 times per day  . Chlorhexidine Gluconate Cloth  6 each Topical Q0600  . mupirocin ointment  1 application Nasal BID  . sodium chloride  3 mL Intravenous Q12H  . trimethoprim-polymyxin b  1 drop Left Eye 6 times per day    Continuous Infusions: . sodium chloride 50 mL/hr at 04/06/15 0358  . feeding supplement (OSMOLITE 1.2 CAL) 1,000 mL ( 2221)    PRN Meds: acetaminophen **OR** acetaminophen,  glycopyrrolate, levalbuterol, morphine injection, ondansetron **OR** ondansetron (ZOFRAN) IV  Palliative Performance Scale: 10 %     Vital Signs: BP 105/76 mmHg  Pulse 104  Temp(Src) 97.2 F (36.2 C) (Axillary)  Resp 31  Ht  (1.778 m)  Wt 90.81 kg (200 lb 3.2 oz)  BMI 28.73 kg/m2  SpO2 99% SpO2: SpO2: 99 % O2 Device: O2 Device: NRB O2 Flow Rate: O2 Flow Rate (L/min): 15 L/min  Intake/output summary:  Intake/Output Summary (Last 24 hours) at 04/06/15 1053 Last data filed at 04/06/15 0930  Gross per 24 hour  Intake   2799 ml  Output    701 ml  Net   2098 ml   LBM: Last BM Date: 04/06/15 Baseline Weight: Weight: 83.915 kg (185 lb) Most recent weight: Weight: 90.81 kg (200 lb 3.2 oz)  Physical Exam:             General: transitioning at EOL, unresponsive to gentle touch and verbal stimuli HEENT: dry buccal membranes, audible throat secretion CVS: tachycardic Resp: tachycepneic    Skin: clammy     Additional Data Reviewed: Recent Labs       0402  04/06/15  0315  WBC  20.7*  20.3*  HGB  10.1*  9.6*  PLT  200  230  NA  147*  138  BUN  40*  45*  CREATININE  1.93*  1.63*     Problem List:  Patient Active Problem List   Diagnosis Date Noted  . Pressure ulcer   .  Encounter for palliative care   . Sepsis (HCC) August 31, 2014  . HCAP (healthcare-associated pneumonia) August 31, 2014  . Acute renal failure superimposed on stage 3 chronic kidney disease (HCC) August 31, 2014  . Aspiration pneumonia (HCC) August 31, 2014  . Acute on chronic respiratory failure with hypoxia (HCC) August 31, 2014  . Atrial fibrillation (HCC)   . Sepsis due to pneumonia (HCC)   . Decreased hearing 01/25/2015  . 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 Depression 01/05/2014  . PFO (patent foramen ovale) 01/05/2014  . Aortic valve stenosis, moderate 01/05/2014  . Cerebral embolism with cerebral infarction (HCC) 12/31/2013  . Health care maintenance 08/21/2013  . HIP FRACTURE, LEFT  11/14/2009  . 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 OBSTRUCTIVE SLEEP APNEA 06/25/2007  . Hyperlipidemia 05/19/2007  . Essential hypertension 05/19/2007     Palliative Care Assessment & Plan    Code Status:  DNR   Dc artificial feeding and hydration  No further labs or diagnostic  Goals of Care:  Full comfort           - no further diagnostics, artifical feeding or hydration or other life prolonging intervetnions           -focus on comfort and dignity    Desire for further Chaplaincy support:no   Symptom Management:  Dyspnea: Morphine  2 mg IV every 1 hr prn  Terminal  Secretion: Robinul 0.4 mg IV every 6 hrs prn   5. Prognosis: Hours - Days  5. Discharge Planning: Anticipated Hospital Death   Care plan was discussed with Dr Joseph ArtWoods and bedside RN  Thank you for allowing the Palliative Medicine Team to assist in the care of this patient.   Time In: 1000 Time Out: 1100 Total Time 60 min Prolonged Time Billed  no     Greater than 50%  of this time was spent counseling and coordinating care related to the above assessment and plan.     Canary BrimMary W Larach, NP  04/06/2015, 10:53 AM  Please contact Palliative Medicine Team phone at [phone] for questions and concerns.

## 2015-04-06 NOTE — Progress Notes (Signed)
RT NT suctioned pt without any complications, pt had thick tan pink tinged secretions

## 2015-04-06 NOTE — Progress Notes (Signed)
[facility] TEAM 1 - Stepdown/ICU TEAM Progress Note  Calvin Merritt SWN:[id number] DOB: [birth year] DOA: 04/17/2015 PCP: Myrlene Broker, MD  Admit HPI / Brief Narrative: 74 y.o. WM PMHx HTN, PFO, AV Stenosis, Chronic Diastolic CHF, A-Fib on Eliquis  HLD,  HereditarySpastic Paraparesis, Hep C Antibody positive, CVA (embolic stroke), Rt Parotid Mass,   Presented with fever, shortness of breath.  Patient was recently diagnosed as embolic stroke on 03/08/15. He was treated with tPA. He has a feeding tube placement. He was discharged to the [facility], where he had unsuccessful Interventional Radiological procedure. He was sent here from rehab facility today due to fever and shortness of breath. Patient was noticed to have fever of 104.5. He has severe respiratory distress. He has gurgling sound in his throat per his wife. Pt could not talk and is mentally disorientated since he had stroke. He cannot move his legs due to hx of spastic paraparesis. Patient does not seem to have diarrhea, hematuria, or hematochezia per his wife.  In ED, patient was found to have lactate of 4.85, WBC 19.9, temperature 103.3, tachycardia, worsening renal function. Urinalysis showed small amount of leukocytes. Chest x-ray showed patchy basilar opacity. Patients admitted to inpatient for further eval and treatment.    HPI/Subjective: 11/17 A/O 0, unresponsive to painful stimuli  Assessment/Plan: Altered mental status -Patient unresponsive to painful stimuli, and previous stroke 02/2015 with findings showing patient to be at high risk for future stroke.  -Comfort Care  Acute on chronic respiratory failure/HCAP vs Aspiration pneumonia -CXR -showed patchy bibasilar opacities, plus leukocytosis and fever, consistent with pneumonia.  -Patient is septic with leukocytosis, fever, tachycardia and elevated lactate and tachypnea.  -Comfort Care  Possible UTI:  -Comfort Care  Essential  hypertension: --Comfort Care  HLD:  --Comfort Care  Cerebral embolism with cerebral infarction [facility]): --Comfort Care  Atrial Fibrillation:  -Comfort Care  AoCKD-III: (Baseline Cr~1.60, ) --Comfort Care  Goals of care: NP Canary Brim, had meeting with family this a.m. and they agreed on making patient comfort care.   Code Status: DO NOT RESUSCITATE/Comfort Care Family Communication: family present at time of exam Disposition Plan: Comfort Care    Consultants:   Procedure/Significant Events: 11/15 renal ultrasound;No hydronephrosis/ focal abnormality of left kidney.- Surgically absent right kidney. -Moderate prostate gland enlargement   Culture   Antibiotics: Zosyn 11/15>> Vancomycin 11/15>>   DVT prophylaxis: Eliquis   Devices    LINES / TUBES:      Continuous Infusions: . sodium chloride 10 mL/hr at 04/06/15 1052  . feeding supplement (OSMOLITE 1.2 CAL) Stopped (04/06/15 1052)    Objective: VITAL SIGNS: Temp: 97.2 F (36.2 C) (11/17 0806) Temp Source: Axillary (11/17 0806) BP: 105/76 mmHg (11/17 0806) Pulse Rate: 104 (11/17 0806) SPO2; FIO2:   Intake/Output Summary (Last 24 hours) at 04/06/15 1826 Last data filed at 04/06/15 0930  Gross per 24 hour  Intake 1644.33 ml  Output    701 ml  Net 943.33 ml     Exam: General: A/O 0, mildly responsive to painful stimuli, positive acute respiratory distress Eyes: ,negative scleral hemorrhage ENT: Negative Runny nose, negative gingival bleeding, Neck:  Negative scars, masses, torticollis, lymphadenopathy, JVD Lungs: Clear to auscultation bilaterally without wheezes or crackles Cardiovascular: Sinus Tachycardic, regular rhythm without murmur gallop or rub normal S1 and S2 Abdomen:negative abdominal pain, nondistended, positive soft, bowel sounds, no rebound, no ascites, no appreciable mass Extremities: No significant cyanosis, clubbing, or edema bilateral lower extremities Psychiatric:  Unable to assess Neurologic:  Unable to assess    Data Reviewed: Basic Metabolic Panel:  Recent Labs Lab 05/27/2014 0035 05/27/2014 0435  0402 04/06/15 0315  NA 143 143 147* 138  K 5.0 4.4 4.6 3.8  CL 106 114* 114* 111  CO2 25 23 22  19*  GLUCOSE 294* 261* 148* 185*  BUN 45* 39* 40* 45*  CREATININE 1.98* 1.60* 1.93* 1.63*  CALCIUM 9.5 8.1* 8.3* 7.8*  MG  --   --  2.0  --    Liver Function Tests:  Recent Labs Lab 05/27/2014 0035  0402 04/06/15 0315  AST 32 29 23  ALT 37 27 25  ALKPHOS 65 59 65  BILITOT 1.1 0.9 0.7  PROT 5.9* 5.1* 5.2*  ALBUMIN 2.4* 1.7* 1.6*   No results for input(s): LIPASE, AMYLASE in the last 168 hours.  Recent Labs Lab 05/27/2014 1350 04/06/15 0315  AMMONIA 25 26   CBC:  Recent Labs Lab 05/27/2014 0035 05/27/2014 1902  0402 04/06/15 0315  WBC 19.9* 19.0* 20.7* 20.3*  NEUTROABS 17.7* 17.8* 18.9*  --   HGB 13.3 10.7* 10.1* 9.6*  HCT 42.4 35.3* 33.8* 30.5*  MCV 95.5 95.4 96.6 93.0  PLT 256 215 200 230   Cardiac Enzymes: No results for input(s): CKTOTAL, CKMB, CKMBINDEX, TROPONINI in the last 168 hours. BNP (last 3 results)  Recent Labs  05/27/2014 0234  BNP 667.7*    ProBNP (last 3 results) No results for input(s): PROBNP in the last 8760 hours.  CBG:  Recent Labs Lab  1636  2010 04/06/15 0049 04/06/15 0450 04/06/15 0802  GLUCAP 134* 149* 154* 173* 159*    Recent Results (from the past 240 hour(s))  Culture, blood (routine x 2)     Status: None (Preliminary result)   Collection Time: 05/27/2014 12:35 AM  Result Value Ref Range Status   Specimen Description BLOOD LEFT ANTECUBITAL  Final   Special Requests BOTTLES DRAWN AEROBIC AND ANAEROBIC 5CC   Final   Culture NO GROWTH 2 DAYS  Final   Report Status PENDING  Incomplete  Culture, blood (routine x 2)     Status: None (Preliminary result)   Collection Time: 05/27/2014 12:40 AM  Result Value Ref Range Status   Specimen Description BLOOD  LEFT HAND  Final   Special Requests IN PEDIATRIC BOTTLE 4CC  Final   Culture NO GROWTH 2 DAYS  Final   Report Status PENDING  Incomplete  Urine culture     Status: None   Collection Time: 05/27/2014 12:46 AM  Result Value Ref Range Status   Specimen Description URINE, CATHETERIZED  Final   Special Requests NONE  Final   Culture NO GROWTH 1 DAY  Final   Report Status  FINAL  Final  Gram stain     Status: None   Collection Time: 05/27/2014  3:55 AM  Result Value Ref Range Status   Specimen Description TRACHEAL SITE  Final   Special Requests NONE  Final   Gram Stain   Final    ABUNDANT WBC PRESENT,BOTH PMN AND MONONUCLEAR GRAM POSITIVE COCCI IN PAIRS IN CHAINS Gram Stain Report Called to,Read Back By and Verified WithMarcene Corning: C STRAUGHAN RN [phone] 05/27/2014 A BROWNING    Report Status 2014/06/17 FINAL  Final  Culture, respiratory (NON-Expectorated)     Status: None (Preliminary result)   Collection Time: 05/27/2014  3:55 AM  Result Value Ref Range Status   Specimen Description TRACHEAL SITE  Final   Special Requests NONE  Final   Gram Stain   Final    ABUNDANT WBC PRESENT, PREDOMINANTLY PMN RARE SQUAMOUS EPITHELIAL CELLS PRESENT FEW GRAM POSITIVE COCCI IN CLUSTERS Performed at [facility]    Culture   Final    ABUNDANT STAPHYLOCOCCUS AUREUS Note: RIFAMPIN AND GENTAMICIN SHOULD NOT BE USED AS SINGLE DRUGS FOR TREATMENT OF STAPH INFECTIONS. Performed at [facility]    Report Status PENDING  Incomplete  MRSA PCR Screening     Status: Abnormal   Collection Time: 04/01/2015  9:17 AM  Result Value Ref Range Status   MRSA by PCR POSITIVE (A) NEGATIVE Final    Comment:        The GeneXpert MRSA Assay (FDA approved for NASAL specimens only), is one component of a comprehensive MRSA colonization surveillance program. It is not intended to diagnose MRSA infection nor to guide or monitor treatment for MRSA infections. RESULT CALLED TO, READ BACK BY AND VERIFIED  WITH: Jesus Genera RN 11:20 04/03/2015 ([email or web address])      Studies:  Recent x-ray studies have been reviewed in detail by the Attending Physician  Scheduled Meds:  Scheduled Meds: . artificial tears  1 application Right Eye 3 times per day  . Chlorhexidine Gluconate Cloth  6 each Topical Q0600  . mupirocin ointment  1 application Nasal BID  . sodium chloride  3 mL Intravenous Q12H  . trimethoprim-polymyxin b  1 drop Left Eye 6 times per day    Time spent on care of this patient: 40 mins   WOODS, Roselind Messier , MD  [facility] Office  [phone] Pager - [phone]  On-Call/Text Page:      [email or web address]      password [email or web address]  If 7PM-7AM, please contact night-coverage [email or web address] Password [email or web address] 04/06/2015, 6:26 PM   LOS: 2 days   Care during the described time interval was provided by me .  I have reviewed this patient's available data, including medical history, events of note, physical examination, and all test results as part of my evaluation. I have personally reviewed and interpreted all radiology studies.   Carolyne Littles, MD [phone] Pager

## 2015-04-06 NOTE — Progress Notes (Signed)
RN, Thayer Ohmhris, paged this NP because pt's respirations have become more rapid and labored. Pt placed on NRB. NP reviewed chart-complicated social issues with attorney as pt's guardian; a son, daughter and apparently "ex" wife involved. Charting reveals an extremely poor prognosis with anticipated death within days. Pt was made a DNR after palliative care consult. However, palliative has recommended comfort care but this is still not in place.  NP to bedside. Pt is unresponsive but appears uncomfortable with labored respirations. RR upper 20s - 30s with use of accessory muscles. O2 sat 93% on NRB. Pt is audibly rhonchus. Card: Afib (not new).  1. Increased respiratory effort-continue NRB. Get a CXR. Pt started TFs 11/16 via PEG so some concern about aspiration. Morphine for discomfort. Pt has known PNA on Vanc/Zosyn.  Should pt decline further tonight, will talk with family and attorney (guardian) about treatment plan.  Jimmye NormanKaren Kirby-Graham, NP [facility]

## 2015-04-07 ENCOUNTER — Inpatient Hospital Stay (HOSPITAL_COMMUNITY): Payer: Medicare Other

## 2015-04-07 DIAGNOSIS — J9621 Acute and chronic respiratory failure with hypoxia: Secondary | ICD-10-CM

## 2015-04-07 DIAGNOSIS — A419 Sepsis, unspecified organism: Principal | ICD-10-CM

## 2015-04-07 DIAGNOSIS — N183 Chronic kidney disease, stage 3 (moderate): Secondary | ICD-10-CM

## 2015-04-07 DIAGNOSIS — J189 Pneumonia, unspecified organism: Secondary | ICD-10-CM

## 2015-04-07 DIAGNOSIS — N179 Acute kidney failure, unspecified: Secondary | ICD-10-CM

## 2015-04-07 LAB — CULTURE, RESPIRATORY W GRAM STAIN

## 2015-04-07 LAB — CULTURE, RESPIRATORY

## 2015-04-07 MED ORDER — BIOTENE DRY MOUTH MT LIQD: 15.0000 mL | OROMUCOSAL | Status: DC | PRN

## 2015-04-07 MED ORDER — MORPHINE SULFATE 25 MG/ML IV SOLN
3.0000 mg/h | INTRAVENOUS | Status: DC
  Administered 2015-04-07: 1 mg/h via INTRAVENOUS
  Filled 2015-04-07: qty 10

## 2015-04-07 MED ORDER — WHITE PETROLATUM GEL
Status: AC
  Filled 2015-04-07: qty 1

## 2015-04-07 MED ORDER — POLYVINYL ALCOHOL 1.4 % OP SOLN
1.0000 [drp] | Freq: Four times a day (QID) | OPHTHALMIC | Status: DC | PRN
  Administered 2015-04-07 (×2): 1 [drp] via OPHTHALMIC
  Filled 2015-04-07: qty 15

## 2015-04-07 NOTE — Progress Notes (Signed)
Nutrition Brief Note  Chart reviewed. Pt now transitioning to comfort care.  No further nutrition interventions warranted at this time.  Please re-consult as needed.   Jalyne Brodzinski A. Thaddeus Evitts, RD, LDN, CDE Pager: [phone] After hours Pager: [phone]  

## 2015-04-07 NOTE — Progress Notes (Signed)
PATIENT DETAILS Name: Calvin Merritt Age: 74 y.o. Sex: male Date of Birth: 06/07/1940 Admit Date: 04/18/2015 Admitting Physician Lorretta HarpXilin Niu, MD ZOX:[id number]:Elizabeth Alison MurrayA Crawford, MD  Brief Narrative: 74 yo M Hx HTN, PFO, AV Stenosis, Chronic Diastolic CHF, A-Fib on Eliquis HLD,Hereditary Spastic Paraparesis, Hep C Antibody positive, CVA, and a R Parotid Mass who presented with HCAP and acute encephalopathy. 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 Current hospital course complicated by development of worsening respiratory failure, subsequently seen by palliative care team, and transition to full comfort measures.   Subjective: Resting comfortably-some labored breathing.  Assessment/Plan: Principal Problem: Acute hypoxic respiratory failure secondary to HCAP/aspiration: Initially on broad-spectrum antibiotics with vancomycin and Zosyn. Since deteriorated with worsening respiratory failure, seen by palliative care team-now on full comfort measures. No longer on antibiotics. Will start Morphine gtt for comfort.  Active Problems: Sepsis: Secondary to pneumonia. All antibiotics discontinued as patient transition to full comfort measures.Tracheal aspirate positive for MRSA.Blood cultures negative so far.    ARF: Secondary to prerenal azotemia in the setting of sepsis. Managed initially with IV fluids-but no transition to full comfort measures.    Acute encephalopathy: Secondary to sepsis/ARF. During his initial hospital course-MRI brain contemplated-but patient was too unstable to proceed with this. Subsequently developed worsening respiratory failure-and no transition to full comfort measures. No further workup required at the setting.  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 Previously on Eliquis prior to this admission-as  has not been discontinued as on full comfort measures  Hyponatremia: Previously treated with free water-now on full comfort me Unconscious-asures-no further lab work required.  History of spastic paraparesis  Disposition: Remain inpatient-anticipate inpatient death  Antimicrobial agents  See below  Anti-infectives    Start     Dose/Rate Route Frequency Ordered Stop    0600  vancomycin (VANCOCIN) IVPB 1000 mg/200 mL premix  Status:  Discontinued     1,000 mg 200 mL/hr over 60 Minutes Intravenous Every 24 hours 04/14/2015 0400 04/16/2015 1457    0600  vancomycin (VANCOCIN) 1,250 mg in sodium chloride 0.9 % 250 mL IVPB  Status:  Discontinued     1,250 mg 166.7 mL/hr over 90 Minutes Intravenous Every 24 hours 03/24/2015 1457 04/06/15 1052   04/13/2015 0800  piperacillin-tazobactam (ZOSYN) IVPB 3.375 g  Status:  Discontinued     3.375 g 12.5 mL/hr over 240 Minutes Intravenous Every 8 hours 03/24/2015 0400 04/06/15 1052   04/06/2015 0600  piperacillin-tazobactam (ZOSYN) IVPB 3.375 g  Status:  Discontinued     3.375 g 100 mL/hr over 30 Minutes Intravenous 3 times per day  0259 04/14/2015 0359   04/03/2015 0115  vancomycin (VANCOCIN) 1,500 mg in sodium chloride 0.9 % 500 mL IVPB     1,500 mg 250 mL/hr over 120 Minutes Intravenous  Once 04/14/2015 0108 04/12/2015 0440   03/21/2015 0100  piperacillin-tazobactam (ZOSYN) IVPB 3.375 g     3.375 g 100 mL/hr over 30 Minutes Intravenous  Once 04/03/2015 0059  0157   03/30/2015 0100  vancomycin (VANCOCIN) IVPB 1000 mg/200 mL premix  Status:  Discontinued     1,000 mg 200 mL/hr over 60 Minutes Intravenous  Once 03/22/2015 0059 04/06/2015 0108      DVT Prophylaxis: None  Code Status: DNR  Family Communication  Multiple family members at bedside  Procedures: None  CONSULTS:  Palliative Care  Time spent 30  minutes-Greater than 50% of this time was spent in counseling, explanation of diagnosis, planning of further management, and  coordination of care.  MEDICATIONS: Scheduled Meds: . artificial tears  1 application Right Eye 3 times per day  . Chlorhexidine Gluconate Cloth  6 each Topical Q0600  . mupirocin ointment  1 application Nasal BID  . sodium chloride  3 mL Intravenous Q12H  . trimethoprim-polymyxin b  1 drop Left Eye 6 times per day   Continuous Infusions: . sodium chloride 10 mL/hr at 04/06/15 1052  . morphine     PRN Meds:.acetaminophen **OR** acetaminophen, antiseptic oral rinse, glycopyrrolate, levalbuterol, LORazepam, morphine injection, ondansetron **OR** ondansetron (ZOFRAN) IV, polyvinyl alcohol    PHYSICAL EXAM: Vital signs in last 24 hours: Filed Vitals:   04/06/15 0700 04/06/15 0806 04/06/15 2142 04/07/15 0627  BP: 91/62 105/76 111/78 123/75  Pulse: 30 104 54 58  Temp:  97.2 F (36.2 C) 98.6 F (37 C) 98.3 F (36.8 C)  TempSrc:  Axillary Axillary   Resp:   26 22  Height:   6\' 2"  (1.88 m)   Weight:   88.179 kg (194 lb 6.4 oz)   SpO2: 98% 99% 99% 97%    Weight change: -1.996 kg (-4 lb 6.4 oz) Filed Weights    1600 04/06/15 0500 04/06/15 2142  Weight: 90.175 kg (198 lb 12.8 oz) 90.81 kg (200 lb 3.2 oz) 88.179 kg (194 lb 6.4 oz)   Body mass index is 24.95 kg/(m^2).   Gen Exam: unresponsive-labored breathing  Chest: transmitted upper airway sounds-but otherwise clear.  CVS: S1 S2 irregular, no murmurs.    Intake/Output from previous day:  Intake/Output Summary (Last 24 hours) at 04/07/15 1406 Last data filed at 04/07/15 0630  Gross per 24 hour  Intake     70 ml  Output   1150 ml  Net  -1080 ml     LAB RESULTS: CBC  Recent Labs Lab April 06, 2015 0035 04/06/2015 1902  0402 04/06/15 0315  WBC 19.9* 19.0* 20.7* 20.3*  HGB 13.3 10.7* 10.1* 9.6*  HCT 42.4 35.3* 33.8* 30.5*  PLT 256 215 200 230  MCV 95.5 95.4 96.6 93.0  MCH 30.0 28.9 28.9 29.3  MCHC 31.4 30.3 29.9* 31.5  RDW 15.4 15.8* 15.8* 15.7*  LYMPHSABS 1.5 0.6* 1.4  --   MONOABS 0.7 0.4 0.4  --    EOSABS 0.0 0.2 0.0  --   BASOSABS 0.0 0.0 0.0  --     Chemistries   Recent Labs Lab 04/06/15 0035 April 06, 2015 0435  0402 04/06/15 0315  NA 143 143 147* 138  K 5.0 4.4 4.6 3.8  CL 106 114* 114* 111  CO2 25 23 22  19*  GLUCOSE 294* 261* 148* 185*  BUN 45* 39* 40* 45*  CREATININE 1.98* 1.60* 1.93* 1.63*  CALCIUM 9.5 8.1* 8.3* 7.8*  MG  --   --  2.0  --     CBG:  Recent Labs Lab  1636  2010 04/06/15 0049 04/06/15 0450 04/06/15 0802  GLUCAP 134* 149* 154* 173* 159*    GFR Estimated Creatinine Clearance: 46.2 mL/min (by C-G formula based on Cr of 1.63).  Coagulation profile  Recent Labs Lab 04-06-15 0439  INR 2.10*    Cardiac Enzymes No results for input(s): CKMB, TROPONINI, MYOGLOBIN in the last 168 hours.  Invalid input(s): CK  Invalid input(s): POCBNP No results for input(s): DDIMER  in the last 72 hours. No results for input(s): HGBA1C in the last 72 hours. No results for input(s): CHOL, HDL, LDLCALC, TRIG, CHOLHDL, LDLDIRECT in the last 72 hours. No results for input(s): TSH, T4TOTAL, T3FREE, THYROIDAB in the last 72 hours.  Invalid input(s): FREET3 No results for input(s): VITAMINB12, FOLATE, FERRITIN, TIBC, IRON, RETICCTPCT in the last 72 hours. No results for input(s): LIPASE, AMYLASE in the last 72 hours.  Urine Studies No results for input(s): UHGB, CRYS in the last 72 hours.  Invalid input(s): UACOL, UAPR, USPG, UPH, UTP, UGL, UKET, UBIL, UNIT, UROB, ULEU, UEPI, UWBC, URBC, UBAC, CAST, UCOM, BILUA  MICROBIOLOGY: Recent Results (from the past 240 hour(s))  Culture, blood (routine x 2)     Status: None (Preliminary result)   Collection Time: 2015-04-15 12:35 AM  Result Value Ref Range Status   Specimen Description BLOOD LEFT ANTECUBITAL  Final   Special Requests BOTTLES DRAWN AEROBIC AND ANAEROBIC 5CC   Final   Culture NO GROWTH 3 DAYS  Final   Report Status PENDING  Incomplete  Culture, blood (routine x 2)     Status:  None (Preliminary result)   Collection Time: 04/15/2015 12:40 AM  Result Value Ref Range Status   Specimen Description BLOOD LEFT HAND  Final   Special Requests IN PEDIATRIC BOTTLE 4CC  Final   Culture NO GROWTH 3 DAYS  Final   Report Status PENDING  Incomplete  Urine culture     Status: None   Collection Time: 04-15-15 12:46 AM  Result Value Ref Range Status   Specimen Description URINE, CATHETERIZED  Final   Special Requests NONE  Final   Culture NO GROWTH 1 DAY  Final   Report Status  FINAL  Final  Gram stain     Status: None   Collection Time: 2015/04/15  3:55 AM  Result Value Ref Range Status   Specimen Description TRACHEAL SITE  Final   Special Requests NONE  Final   Gram Stain   Final    ABUNDANT WBC PRESENT,BOTH PMN AND MONONUCLEAR GRAM POSITIVE COCCI IN PAIRS IN CHAINS Gram Stain Report Called to,Read Back By and Verified WithMarcene Corning RN [phone] April 15, 2015 A BROWNING    Report Status 04/15/2015 FINAL  Final  Culture, respiratory (NON-Expectorated)     Status: None   Collection Time: 04/15/15  3:55 AM  Result Value Ref Range Status   Specimen Description TRACHEAL SITE  Final   Special Requests NONE  Final   Gram Stain   Final    ABUNDANT WBC PRESENT, PREDOMINANTLY PMN RARE SQUAMOUS EPITHELIAL CELLS PRESENT FEW GRAM POSITIVE COCCI IN CLUSTERS Performed at [facility]    Culture   Final    ABUNDANT METHICILLIN RESISTANT STAPHYLOCOCCUS AUREUS 18 Note: RIFAMPIN AND GENTAMICIN SHOULD NOT BE USED AS SINGLE DRUGS FOR TREATMENT OF STAPH INFECTIONS. CRITICAL RESULT CALLED TO, READ BACK BY AND VERIFIED WITH: CHRISTY AT [facility] 11 16 @ 0840 BY PARDA Performed at [facility]    Report Status 04/07/2015 FINAL  Final   Organism ID, Bacteria METHICILLIN RESISTANT STAPHYLOCOCCUS AUREUS  Final      Susceptibility   Methicillin resistant staphylococcus aureus - MIC*    CLINDAMYCIN >=8 RESISTANT Resistant     ERYTHROMYCIN >=8 RESISTANT Resistant     GENTAMICIN  <=0.5 SENSITIVE Sensitive     LEVOFLOXACIN >=8 RESISTANT Resistant     OXACILLIN >=4 RESISTANT Resistant     RIFAMPIN <=0.5 SENSITIVE Sensitive     TRIMETH/SULFA <=10  SENSITIVE Sensitive     VANCOMYCIN <=0.5 SENSITIVE Sensitive     TETRACYCLINE <=1 SENSITIVE Sensitive     * ABUNDANT METHICILLIN RESISTANT STAPHYLOCOCCUS AUREUS  MRSA PCR Screening     Status: Abnormal   Collection Time: 17-Apr-2015  9:17 AM  Result Value Ref Range Status   MRSA by PCR POSITIVE (A) NEGATIVE Final    Comment:        The GeneXpert MRSA Assay (FDA approved for NASAL specimens only), is one component of a comprehensive MRSA colonization surveillance program. It is not intended to diagnose MRSA infection nor to guide or monitor treatment for MRSA infections. RESULT CALLED TO, READ BACK BY AND VERIFIED WITH: Jesus Genera RN 11:20 04-17-15 ([email or web address])     RADIOLOGY STUDIES/RESULTS: Ct Angio Head W/cm &/or Wo Cm  03/08/2015  CLINICAL DATA:  Stroke.  Left-sided weakness EXAM: CT ANGIOGRAPHY HEAD AND NECK CT PERFUSION HEAD TECHNIQUE: Multidetector CT imaging of the head and neck was performed using the standard protocol during bolus administration of intravenous contrast. Multiplanar CT image reconstructions and MIPs were obtained to evaluate the vascular anatomy. Carotid stenosis measurements (when applicable) are obtained utilizing NASCET criteria, using the distal internal carotid diameter as the denominator. CONTRAST:  50mL OMNIPAQUE IOHEXOL 350 MG/ML SOLN COMPARISON:  CT head 03/08/2015 FINDINGS: CT HEAD PERFUSION Delay in time to peak in the right temporal lobe, right insula, and right parietal lobe. There is sparing of the basal ganglia. Cerebral blood volume diminished in the right temporal lobe and right insula compatible with core infarction. There is sparing of the right parietal lobe. Sparing of the right basal ganglia. CT HEAD Brain: Low-density edema is present in the right insula and right temporal lobe  compatible with acute infarct. This is better seen on the postcontrast study than the earlier precontrast study. Chronic infarct left parietal lobe. Generalized atrophy. No acute hemorrhage. Calvarium and skull base: Negative Paranasal sinuses: Negative Orbits: Negative CTA NECK Aortic arch: Mild atherosclerotic disease in the aortic arch without aneurysm or dissection. Proximal great vessels demonstrate no significant stenosis. Lung apices clear.  Patient is intubated. Right carotid system: Right common carotid artery widely patent. Mild atherosclerotic calcification of the carotid bifurcation without significant stenosis or dissection. Right internal carotid artery widely patent to the skullbase. Atherosclerotic disease in the cavernous carotid. Left carotid system: Left common carotid artery has mild atherosclerotic disease. Mild atherosclerotic plaque at the carotid bifurcation without significant stenosis. No dissection. Left internal carotid artery patent to the skullbase. Atherosclerotic disease in the cavernous carotid. Vertebral arteries:Left vertebral artery dominant. No significant left vertebral stenosis. Small right vertebral artery which ends in PICA Skeleton: Cervical degenerative change. No fracture or acute bony lesion. Other neck: Negative for mass or adenopathy. CTA HEAD Anterior circulation: Atherosclerotic calcification in the right cavernous carotid with mild stenosis. Right A1 and A2 segments normal. Right M1 segment normal proximally with occlusion distal right M1 segment. There is reconstitution with delayed flow in the right temporal lobe. There is better flow in the right parietal MCA branches . There is delayed flow in the right parietal branches based on CT perfusion source images. Atherosclerotic calcification and mild stenosis left cavernous carotid. Left A1 and A2 segments widely patent. Left M1 segment normal. Left MCA branches widely patent. Posterior circulation: Left vertebral  artery dominant widely patent the basilar. Left PICA patent. Hypoplastic right vertebral artery ends in PICA. Basilar is widely patent. Superior cerebellar and posterior cerebral arteries patent bilaterally. Patent right posterior communicating artery Venous  sinuses: Patent Anatomic variants: Negative for cerebral aneurysm. No vascular malformation. Delayed phase: Not performed. IMPRESSION: CT perfusion suggests core infarct in the right temporal lobe and right insula. There is delayed time to peak in the right parietal lobe due to right MCA occlusion. Decreased profusion right parietal lobe without cord infarction consistent with penumbra, at risk for infarction. CTA demonstrates occlusion of the distal right M1 segment extending into the right MCA branches. There is delayed enhancement of right middle cerebral artery branches supplying the right parietal lobe which is supplied via collaterals. There is decreased enhancement of vessels in the right temporal lobe compatible with acute infarct. No other significant intracranial stenosis. Mild atherosclerotic disease the carotid bifurcation bilaterally without significant carotid stenosis in the neck. Critical Value/emergent results were called by telephone at the time of interpretation on 03/08/2015 at 2:05 Pm to Dr. Noel Christmas , who verbally acknowledged these results. Electronically Signed   By: Marlan Palau M.D.   On: 03/08/2015 15:14   Ct Angio Neck W/cm &/or Wo/cm  03/08/2015  CLINICAL DATA:  Stroke.  Left-sided weakness EXAM: CT ANGIOGRAPHY HEAD AND NECK CT PERFUSION HEAD TECHNIQUE: Multidetector CT imaging of the head and neck was performed using the standard protocol during bolus administration of intravenous contrast. Multiplanar CT image reconstructions and MIPs were obtained to evaluate the vascular anatomy. Carotid stenosis measurements (when applicable) are obtained utilizing NASCET criteria, using the distal internal carotid diameter as the  denominator. CONTRAST:  50mL OMNIPAQUE IOHEXOL 350 MG/ML SOLN COMPARISON:  CT head 03/08/2015 FINDINGS: CT HEAD PERFUSION Delay in time to peak in the right temporal lobe, right insula, and right parietal lobe. There is sparing of the basal ganglia. Cerebral blood volume diminished in the right temporal lobe and right insula compatible with core infarction. There is sparing of the right parietal lobe. Sparing of the right basal ganglia. CT HEAD Brain: Low-density edema is present in the right insula and right temporal lobe compatible with acute infarct. This is better seen on the postcontrast study than the earlier precontrast study. Chronic infarct left parietal lobe. Generalized atrophy. No acute hemorrhage. Calvarium and skull base: Negative Paranasal sinuses: Negative Orbits: Negative CTA NECK Aortic arch: Mild atherosclerotic disease in the aortic arch without aneurysm or dissection. Proximal great vessels demonstrate no significant stenosis. Lung apices clear.  Patient is intubated. Right carotid system: Right common carotid artery widely patent. Mild atherosclerotic calcification of the carotid bifurcation without significant stenosis or dissection. Right internal carotid artery widely patent to the skullbase. Atherosclerotic disease in the cavernous carotid. Left carotid system: Left common carotid artery has mild atherosclerotic disease. Mild atherosclerotic plaque at the carotid bifurcation without significant stenosis. No dissection. Left internal carotid artery patent to the skullbase. Atherosclerotic disease in the cavernous carotid. Vertebral arteries:Left vertebral artery dominant. No significant left vertebral stenosis. Small right vertebral artery which ends in PICA Skeleton: Cervical degenerative change. No fracture or acute bony lesion. Other neck: Negative for mass or adenopathy. CTA HEAD Anterior circulation: Atherosclerotic calcification in the right cavernous carotid with mild stenosis. Right  A1 and A2 segments normal. Right M1 segment normal proximally with occlusion distal right M1 segment. There is reconstitution with delayed flow in the right temporal lobe. There is better flow in the right parietal MCA branches . There is delayed flow in the right parietal branches based on CT perfusion source images. Atherosclerotic calcification and mild stenosis left cavernous carotid. Left A1 and A2 segments widely patent. Left M1 segment normal. Left MCA  branches widely patent. Posterior circulation: Left vertebral artery dominant widely patent the basilar. Left PICA patent. Hypoplastic right vertebral artery ends in PICA. Basilar is widely patent. Superior cerebellar and posterior cerebral arteries patent bilaterally. Patent right posterior communicating artery Venous sinuses: Patent Anatomic variants: Negative for cerebral aneurysm. No vascular malformation. Delayed phase: Not performed. IMPRESSION: CT perfusion suggests core infarct in the right temporal lobe and right insula. There is delayed time to peak in the right parietal lobe due to right MCA occlusion. Decreased profusion right parietal lobe without cord infarction consistent with penumbra, at risk for infarction. CTA demonstrates occlusion of the distal right M1 segment extending into the right MCA branches. There is delayed enhancement of right middle cerebral artery branches supplying the right parietal lobe which is supplied via collaterals. There is decreased enhancement of vessels in the right temporal lobe compatible with acute infarct. No other significant intracranial stenosis. Mild atherosclerotic disease the carotid bifurcation bilaterally without significant carotid stenosis in the neck. Critical Value/emergent results were called by telephone at the time of interpretation on 03/08/2015 at 2:05 Pm to Dr. Noel Christmas , who verbally acknowledged these results. Electronically Signed   By: Marlan Palau M.D.   On: 03/08/2015 15:14   US  Renal  04-17-2015  CLINICAL DATA:  74 year old with personal history of right nephrectomy for renal cell carcinoma in 2005, presenting with acute renal insufficiency superimposed upon stage III chronic kidney disease. EXAM: Portable RENAL / URINARY TRACT ULTRASOUND COMPLETE COMPARISON:  No prior ultrasound. CT abdomen and pelvis 12/30/2007 and earlier. FINDINGS: Right Kidney: Surgically absent. Left Kidney: Length: Approximately 14.8 cm. Less than optimal visualization of the left kidney due to portable technique. No hydronephrosis. Well-preserved cortex for age. No focal parenchymal abnormality. No visible shadowing calculi. Bladder: Decompressed by Foley catheter. Other: Prostate gland enlarged measuring approximately 5.1 x 5.1 x 5.6 cm, containing calcifications. IMPRESSION: 1. No hydronephrosis and no focal abnormality involving the left kidney. 2. Surgically absent right kidney. 3. Moderate prostate gland enlargement, measured above. Electronically Signed   By: Hulan Saas M.D.   On: April 17, 2015 16:05   Ct Cerebral Perfusion W/cm  03/08/2015  CLINICAL DATA:  Stroke.  Left-sided weakness EXAM: CT ANGIOGRAPHY HEAD AND NECK CT PERFUSION HEAD TECHNIQUE: Multidetector CT imaging of the head and neck was performed using the standard protocol during bolus administration of intravenous contrast. Multiplanar CT image reconstructions and MIPs were obtained to evaluate the vascular anatomy. Carotid stenosis measurements (when applicable) are obtained utilizing NASCET criteria, using the distal internal carotid diameter as the denominator. CONTRAST:  50mL OMNIPAQUE IOHEXOL 350 MG/ML SOLN COMPARISON:  CT head 03/08/2015 FINDINGS: CT HEAD PERFUSION Delay in time to peak in the right temporal lobe, right insula, and right parietal lobe. There is sparing of the basal ganglia. Cerebral blood volume diminished in the right temporal lobe and right insula compatible with core infarction. There is sparing of the right  parietal lobe. Sparing of the right basal ganglia. CT HEAD Brain: Low-density edema is present in the right insula and right temporal lobe compatible with acute infarct. This is better seen on the postcontrast study than the earlier precontrast study. Chronic infarct left parietal lobe. Generalized atrophy. No acute hemorrhage. Calvarium and skull base: Negative Paranasal sinuses: Negative Orbits: Negative CTA NECK Aortic arch: Mild atherosclerotic disease in the aortic arch without aneurysm or dissection. Proximal great vessels demonstrate no significant stenosis. Lung apices clear.  Patient is intubated. Right carotid system: Right common carotid artery widely patent.  Mild atherosclerotic calcification of the carotid bifurcation without significant stenosis or dissection. Right internal carotid artery widely patent to the skullbase. Atherosclerotic disease in the cavernous carotid. Left carotid system: Left common carotid artery has mild atherosclerotic disease. Mild atherosclerotic plaque at the carotid bifurcation without significant stenosis. No dissection. Left internal carotid artery patent to the skullbase. Atherosclerotic disease in the cavernous carotid. Vertebral arteries:Left vertebral artery dominant. No significant left vertebral stenosis. Small right vertebral artery which ends in PICA Skeleton: Cervical degenerative change. No fracture or acute bony lesion. Other neck: Negative for mass or adenopathy. CTA HEAD Anterior circulation: Atherosclerotic calcification in the right cavernous carotid with mild stenosis. Right A1 and A2 segments normal. Right M1 segment normal proximally with occlusion distal right M1 segment. There is reconstitution with delayed flow in the right temporal lobe. There is better flow in the right parietal MCA branches . There is delayed flow in the right parietal branches based on CT perfusion source images. Atherosclerotic calcification and mild stenosis left cavernous  carotid. Left A1 and A2 segments widely patent. Left M1 segment normal. Left MCA branches widely patent. Posterior circulation: Left vertebral artery dominant widely patent the basilar. Left PICA patent. Hypoplastic right vertebral artery ends in PICA. Basilar is widely patent. Superior cerebellar and posterior cerebral arteries patent bilaterally. Patent right posterior communicating artery Venous sinuses: Patent Anatomic variants: Negative for cerebral aneurysm. No vascular malformation. Delayed phase: Not performed. IMPRESSION: CT perfusion suggests core infarct in the right temporal lobe and right insula. There is delayed time to peak in the right parietal lobe due to right MCA occlusion. Decreased profusion right parietal lobe without cord infarction consistent with penumbra, at risk for infarction. CTA demonstrates occlusion of the distal right M1 segment extending into the right MCA branches. There is delayed enhancement of right middle cerebral artery branches supplying the right parietal lobe which is supplied via collaterals. There is decreased enhancement of vessels in the right temporal lobe compatible with acute infarct. No other significant intracranial stenosis. Mild atherosclerotic disease the carotid bifurcation bilaterally without significant carotid stenosis in the neck. Critical Value/emergent results were called by telephone at the time of interpretation on 03/08/2015 at 2:05 Pm to Dr. Noel Christmas , who verbally acknowledged these results. Electronically Signed   By: Marlan Palau M.D.   On: 03/08/2015 15:14   Dg Chest Port 1 View  04/06/2015  CLINICAL DATA:  Shortness of breath with rhonchi and tachypnea. EXAM: PORTABLE CHEST 1 VIEW COMPARISON:  Two days prior 04/11/2015 FINDINGS: Rapid development of extensive bilateral airspace disease, left greater than right. Development of near complete diffuse opacity throughout the left hemithorax, minimal aerated lung in the upper lung zone is  seen. There is primarily perihilar opacity on the right. The cardiomediastinal contours and left hemidiaphragm are obscured. There is no pneumothorax. IMPRESSION: Development of diffuse bilateral airspace opacity over the course of 2 days, with near complete opacification of the left hemithorax. Rapid development favors pulmonary edema, however an element of atelectasis or collapse especially on the left is considered. Electronically Signed   By: Rubye Oaks M.D.   On: 04/06/2015 03:08   Dg Chest Portable 1 View  03/25/2015  CLINICAL DATA:  Initial evaluation for possible sepsis, adventitial lung sounds. EXAM: PORTABLE CHEST 1 VIEW COMPARISON:  Prior radiograph from 03/08/2015 FINDINGS: Cardiomegaly is stable from previous. Mediastinal silhouette within normal limits. Lungs are mildly hypoinflated. There is diffuse vascular congestion without overt pulmonary edema. No pleural effusion. Scattered bibasilar opacities favored  to reflect congestion and/or atelectasis, although superimposed infection not entirely excluded. No pneumothorax. No acute osseus abnormality. Degenerative changes present about the shoulders. IMPRESSION: 1. Cardiomegaly with diffuse pulmonary vascular congestion without overt pulmonary edema. 2. Patchy bibasilar opacities, favored to vascular congestion and/or atelectasis. Superimposed infiltrates not excluded, but less favored. Electronically Signed   By: Rise Mu M.D.   On: 2015/04/13 00:59    Jeoffrey Massed, MD  [facility] Pager:[phone]  If 7PM-7AM, please contact night-coverage [email or web address] Password [email or web address] 04/07/2015, 2:06 PM   LOS: 3 days

## 2015-04-07 NOTE — CV Procedure (Signed)
Echocardiogram was canceled per RN since patient is on palliative care.   Leta Junglingiffany Ken Bonn RDCS, 04/07/15 8:13 AM

## 2015-04-08 MED ORDER — ATROPINE SULFATE 1 % OP SOLN: 2.0000 [drp] | Freq: Four times a day (QID) | OPHTHALMIC | Status: DC

## 2015-04-09 LAB — CULTURE, BLOOD (ROUTINE X 2)
CULTURE: NO GROWTH
Culture: NO GROWTH

## 2015-04-20 ENCOUNTER — Encounter: Payer: Self-pay | Admitting: Cardiology

## 2015-04-20 NOTE — Progress Notes (Signed)
Pt passed away at 0925 with wife at bedside.  No pulse, no breathing.  Lisette GrinderVassie Cain, RN confirmed death.

## 2015-04-20 NOTE — Progress Notes (Signed)
200 cc of Morphine Drip wasted in sink and witnessed by Lisette GrinderVassie Cain, RN.

## 2015-04-20 NOTE — Progress Notes (Signed)
[facility]

## 2015-04-20 NOTE — Progress Notes (Signed)
[facility] the body from the room and Mr. Pleasants from Security escorted him to the morgue to check out the body.

## 2015-04-20 NOTE — Discharge Summary (Signed)
Death Summary  Calvin Merritt UJW:[id number] DOB: 1940-08-13 DOA: 2014/08/23  PCP: Myrlene BrokerElizabeth A Crawford, MD  Admit date: 2014/08/23 Date of Death: 03/26/2015  Final Diagnoses:  Principal Problem:   HCAP (healthcare-associated pneumonia) Active Problems:   Hyperlipidemia   Hereditary spastic paraplegia (HCC)   Essential hypertension   Cerebral embolism with cerebral infarction (HCC)   PFO (patent foramen ovale)   Aortic valve stenosis, moderate   Atrial fibrillation (HCC)   Sepsis (HCC)   Acute renal failure superimposed on stage 3 chronic kidney disease (HCC)   Aspiration pneumonia (HCC)   Acute on chronic respiratory failure with hypoxia (HCC)   Sepsis due to pneumonia (HCC)   Pressure ulcer   Encounter for palliative care   Dyspnea   Increased oropharyngeal secretiom  History of present illness:  74 yo M Hx HTN, PFO, AV Stenosis, Chronic Diastolic CHF, A-Fib on Eliquis HLD,Hereditary Spastic Paraparesis, Hep C Antibody positive, CVA, and a R Parotid Mass who presented with HCAP and acute encephalopathy. 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 Current hospital course complicated by development of worsening respiratory failure, subsequently seen by palliative care team  Hospital Course:  Brief Narrative: 74 yo M Hx HTN, PFO, AV Stenosis, Chronic Diastolic CHF, A-Fib on Eliquis HLD,Hereditary Spastic Paraparesis, Hep C Antibody positive, CVA, and a R Parotid Mass who presented with HCAP and acute encephalopathy. 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 Current hospital course complicated by development of worsening respiratory failure, subsequently seen by palliative care team, and transition to full comfort measures. Patient subsequently expired on 11/19 at 9:25 AM.  Hospital course by problem list: Acute hypoxic  respiratory failure secondary to HCAP/aspiration: Initially on broad-spectrum antibiotics with vancomycin and Zosyn. Since deteriorated with worsening respiratory failure, seen by palliative care team-transition to comfort measures-subsequently started on morphine infusion-as noted above-expired on 11/19.  Active Problems: Sepsis: Secondary to pneumonia. All antibiotics discontinued as patient transition to full comfort measures.Tracheal aspirate positive for MRSA.Blood cultures negative so far.   ARF: Secondary to prerenal azotemia in the setting of sepsis. Managed initially with IV fluids- but once transition to comfort measures-no further lab monitoring done.  Acute encephalopathy: Secondary to sepsis/ARF. During his initial hospital course-MRI brain contemplated-but patient was too unstable to proceed with this. Subsequently developed worsening respiratory failure-and transitioned to full comfort measures.  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 Previously on Eliquis prior to this admission-and has not been continued as was on full comfort measures.  Hyponatremia: Previously treated with free water-once transitioned to full comfort measures-no further workup done.   History of spastic paraparesis  Signed:  Levonne Carreras  [facility] 04/11/2015, 1:31 PM

## 2015-04-20 NOTE — Progress Notes (Signed)
Called [facility] for Referral.  Referral [id number], spoke with Mr. Calvin Merritt.

## 2015-04-20 DEATH — deceased

## 2015-05-08 LAB — CUP PACEART REMOTE DEVICE CHECK: Date Time Interrogation Session: 20161112163821

## 2015-07-24 ENCOUNTER — Ambulatory Visit: Payer: Medicare Other | Admitting: Internal Medicine
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
## 2017-01-16 IMAGING — US US RENAL
1 series · 14 of 25 positions shown · non-contrast
Comparison: No prior ultrasound. CT abdomen and pelvis 12/30/2007
and earlier.

CLINICAL DATA: 73-year-old with personal history of right
nephrectomy for renal cell carcinoma in 5661, presenting with acute
renal insufficiency superimposed upon stage III chronic kidney
disease.

EXAM:
Portable RENAL / URINARY TRACT ULTRASOUND COMPLETE

[Series 1: us renal · 0.27mm/px · 14 of 25 slices shown]
[im 1/25]
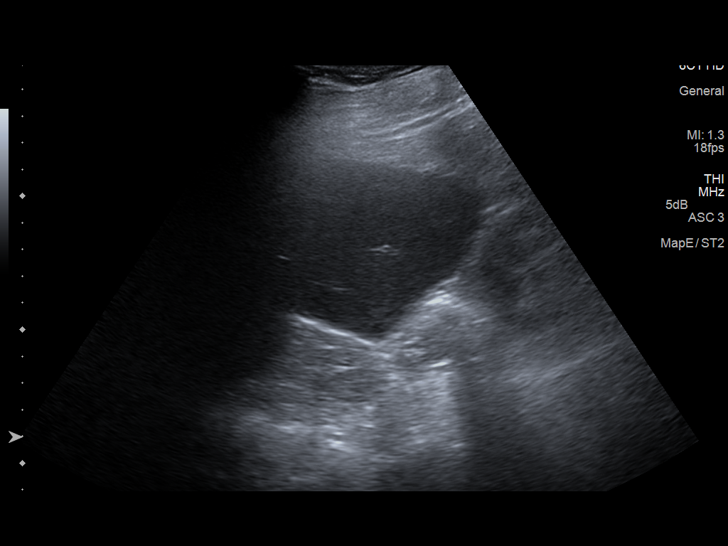
[im 3/25]
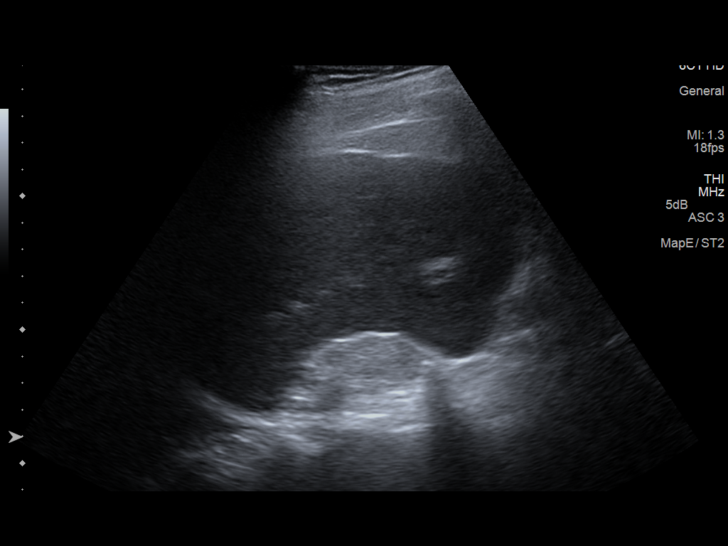
[im 5/25]
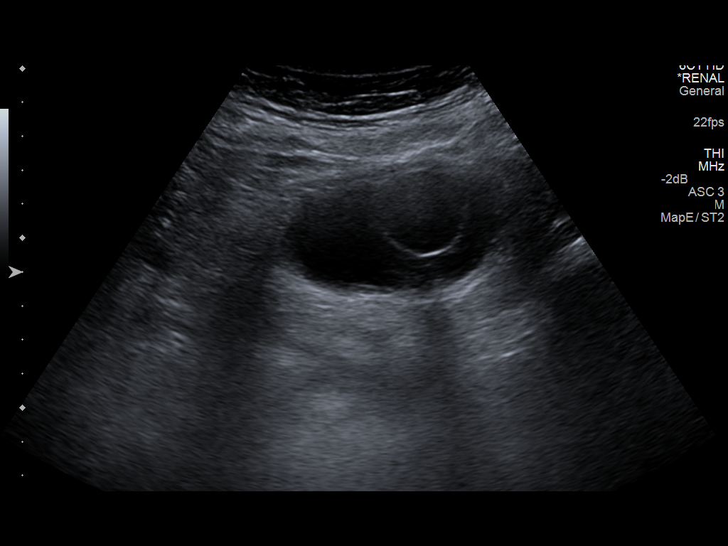
[im 7/25]
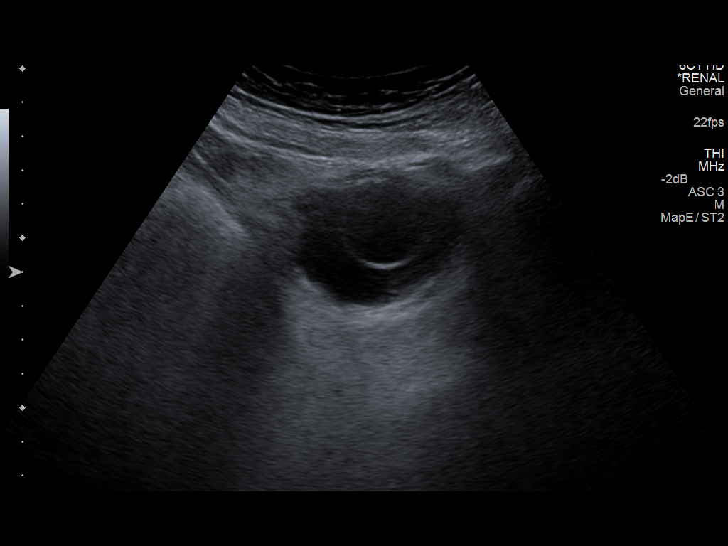
[im 9/25]
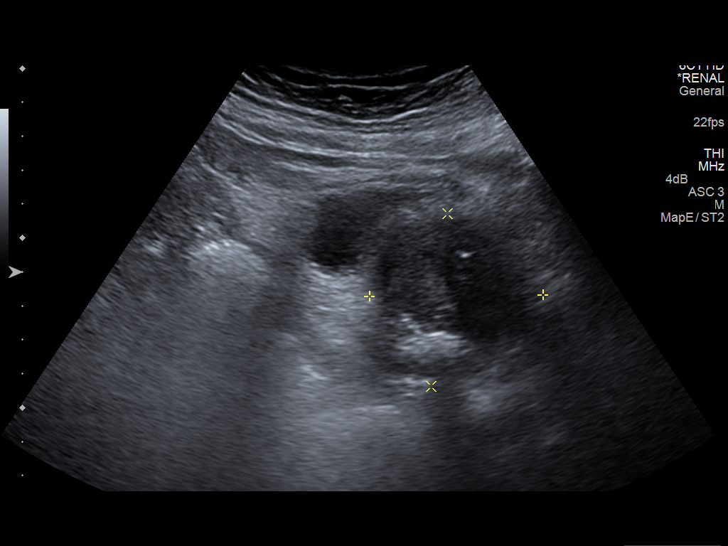
[im 10/25]
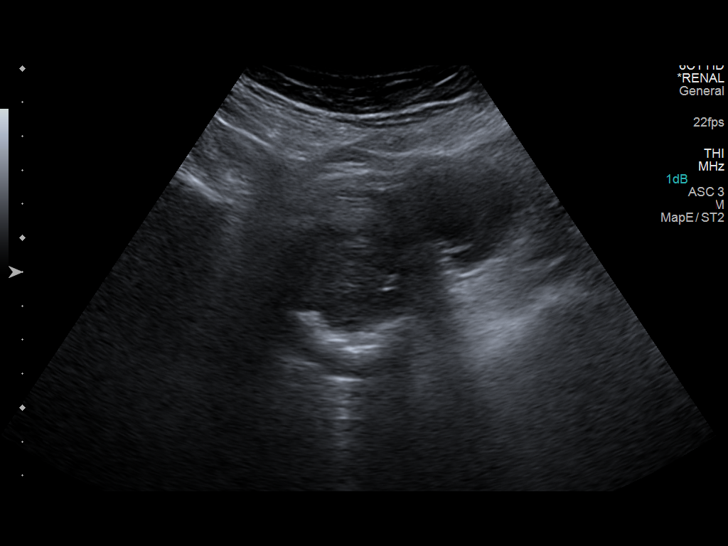
[im 12/25]
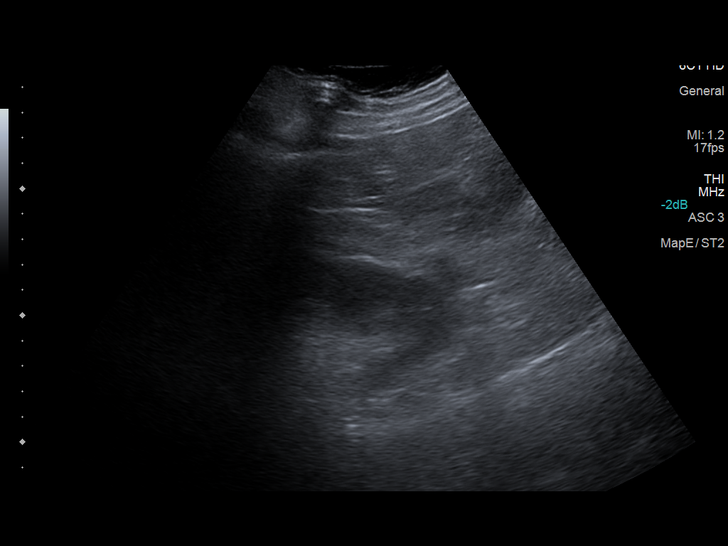
[im 14/25]
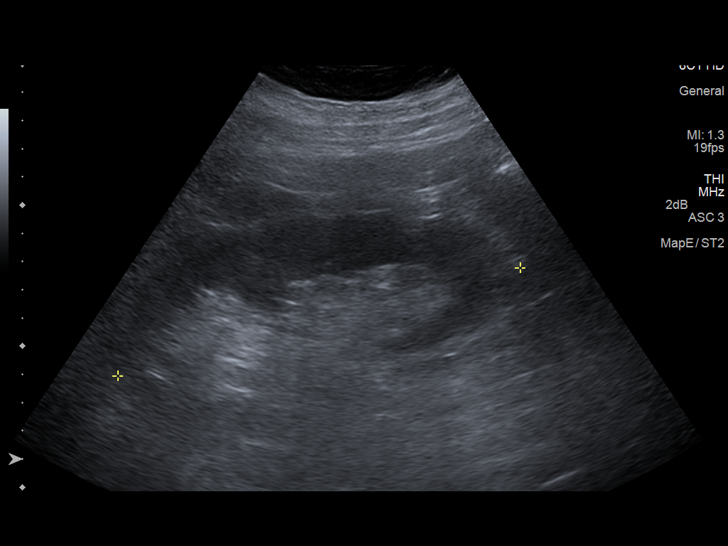
[im 16/25]
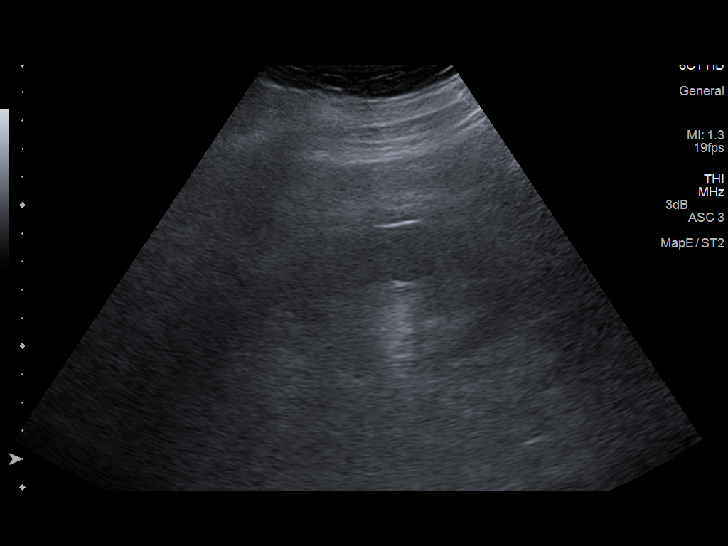
[im 17/25]
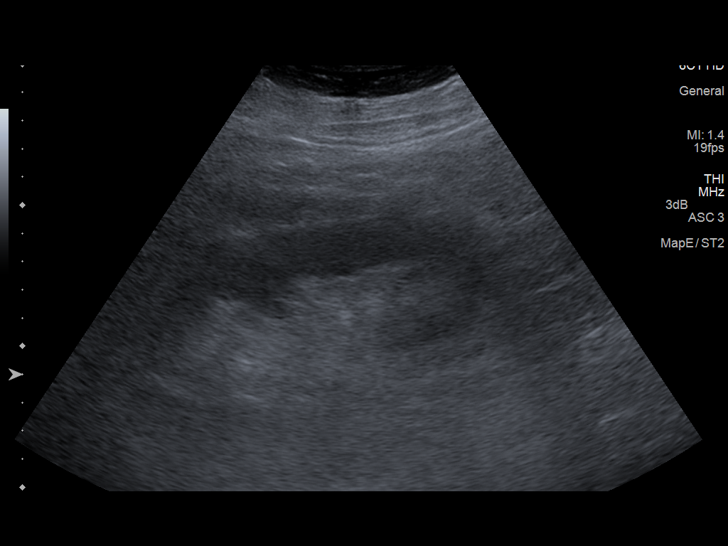
[im 19/25]
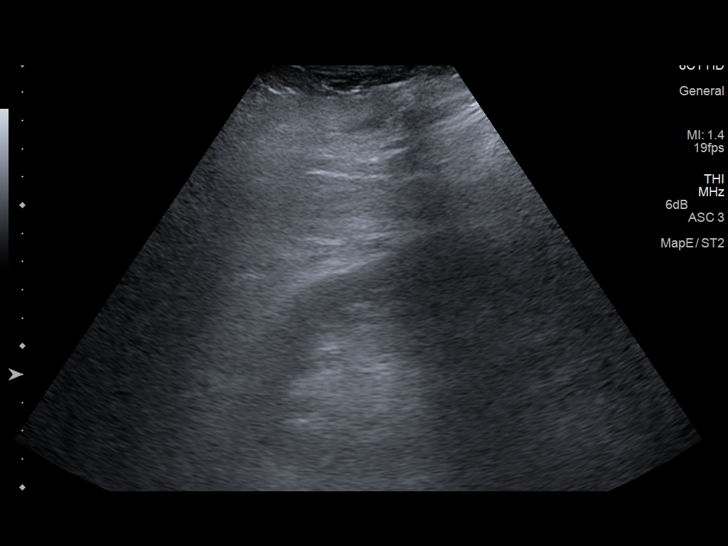
[im 21/25]
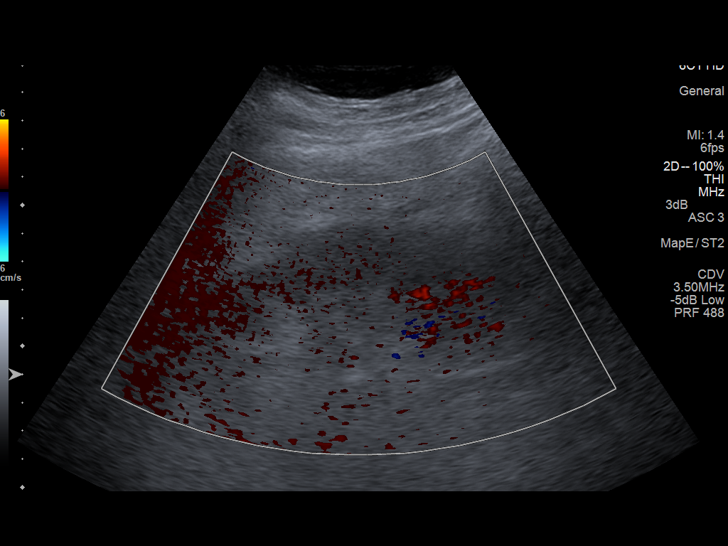
[im 23/25]
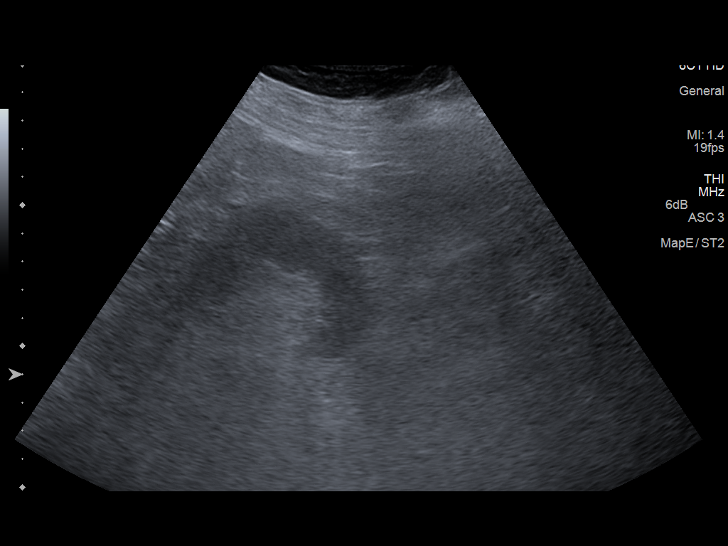
[im 25/25]
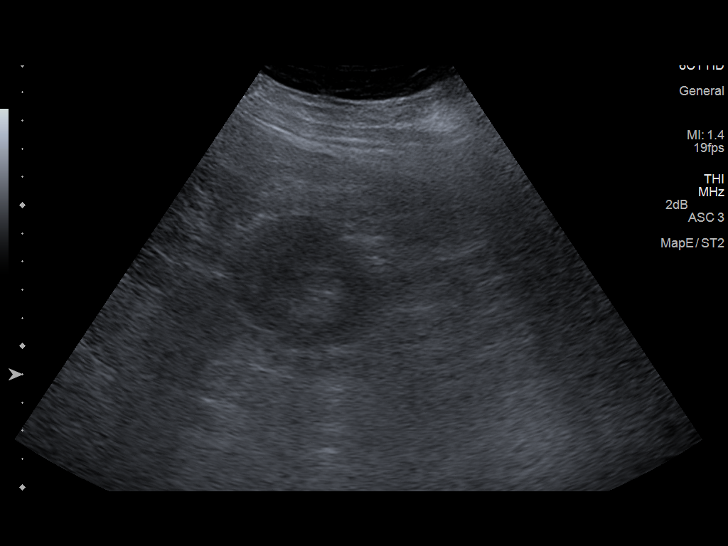

[14 of 25 positions shown; findings below may reference images not displayed]

FINDINGS: Right Kidney:

Surgically absent.

Left Kidney:

Length: Approximately 14.8 cm. Less than optimal visualization of
the left kidney due to portable technique. No hydronephrosis.
Well-preserved cortex for age. No focal parenchymal abnormality. No
visible shadowing calculi.

Bladder:

Decompressed by Foley catheter.

Other:

Prostate gland enlarged measuring approximately 5.1 x 5.1 x 5.6 cm,
containing calcifications.
IMPRESSION: 1. No hydronephrosis and no focal abnormality involving the left
kidney.
2. Surgically absent right kidney.
3. Moderate prostate gland enlargement, measured above.
